# Patient Record
Sex: Male | Born: 1935
Health system: Southern US, Community
[De-identification: ages and names within clinical notes are randomized; demographics above are authoritative.]

## PROBLEM LIST (undated history)

## (undated) DIAGNOSIS — I2510 Atherosclerotic heart disease of native coronary artery without angina pectoris: Secondary | ICD-10-CM

## (undated) DIAGNOSIS — K635 Polyp of colon: Secondary | ICD-10-CM

## (undated) DIAGNOSIS — C61 Malignant neoplasm of prostate: Secondary | ICD-10-CM

## (undated) DIAGNOSIS — E1140 Type 2 diabetes mellitus with diabetic neuropathy, unspecified: Secondary | ICD-10-CM

## (undated) DIAGNOSIS — I10 Essential (primary) hypertension: Secondary | ICD-10-CM

## (undated) DIAGNOSIS — E119 Type 2 diabetes mellitus without complications: Secondary | ICD-10-CM

## (undated) DIAGNOSIS — T7840XA Allergy, unspecified, initial encounter: Secondary | ICD-10-CM

## (undated) DIAGNOSIS — N189 Chronic kidney disease, unspecified: Secondary | ICD-10-CM

## (undated) DIAGNOSIS — R011 Cardiac murmur, unspecified: Secondary | ICD-10-CM

## (undated) DIAGNOSIS — I071 Rheumatic tricuspid insufficiency: Secondary | ICD-10-CM

## (undated) DIAGNOSIS — I517 Cardiomegaly: Secondary | ICD-10-CM

## (undated) DIAGNOSIS — M129 Arthropathy, unspecified: Secondary | ICD-10-CM

## (undated) DIAGNOSIS — C8590 Non-Hodgkin lymphoma, unspecified, unspecified site: Secondary | ICD-10-CM

## (undated) DIAGNOSIS — J449 Chronic obstructive pulmonary disease, unspecified: Secondary | ICD-10-CM

## (undated) DIAGNOSIS — I340 Nonrheumatic mitral (valve) insufficiency: Secondary | ICD-10-CM

## (undated) DIAGNOSIS — I272 Pulmonary hypertension, unspecified: Secondary | ICD-10-CM

## (undated) DIAGNOSIS — I700 Atherosclerosis of aorta: Secondary | ICD-10-CM

## (undated) DIAGNOSIS — E7800 Pure hypercholesterolemia, unspecified: Secondary | ICD-10-CM

## (undated) DIAGNOSIS — I429 Cardiomyopathy, unspecified: Secondary | ICD-10-CM

## (undated) HISTORY — DX: Rheumatic tricuspid insufficiency: I07.1

## (undated) HISTORY — DX: Type 2 diabetes mellitus without complications: E11.9

## (undated) HISTORY — DX: Cardiomyopathy, unspecified: I42.9

## (undated) HISTORY — DX: Chronic obstructive pulmonary disease, unspecified: J44.9

## (undated) HISTORY — DX: Non-Hodgkin lymphoma, unspecified, unspecified site: C85.90

## (undated) HISTORY — DX: Type 2 diabetes mellitus with diabetic neuropathy, unspecified: E11.40

## (undated) HISTORY — DX: Allergy, unspecified, initial encounter: T78.40XA

## (undated) HISTORY — DX: Arthropathy, unspecified: M12.9

## (undated) HISTORY — DX: Cardiac murmur, unspecified: R01.1

## (undated) HISTORY — PX: HEMORROIDECTOMY: SUR656

## (undated) HISTORY — DX: Chronic kidney disease, unspecified: N18.9

## (undated) HISTORY — DX: Essential (primary) hypertension: I10

## (undated) HISTORY — DX: Pulmonary hypertension, unspecified: I27.20

## (undated) HISTORY — DX: Pure hypercholesterolemia, unspecified: E78.00

## (undated) HISTORY — DX: Polyp of colon: K63.5

## (undated) HISTORY — DX: Atherosclerotic heart disease of native coronary artery without angina pectoris: I25.10

## (undated) HISTORY — DX: Atherosclerosis of aorta: I70.0

## (undated) HISTORY — DX: Cardiomegaly: I51.7

## (undated) HISTORY — DX: Malignant neoplasm of prostate: C61

## (undated) HISTORY — DX: Nonrheumatic mitral (valve) insufficiency: I34.0

---

## 2012-07-29 ENCOUNTER — Encounter: Payer: Self-pay | Admitting: Pulmonary Disease

## 2012-07-30 ENCOUNTER — Ambulatory Visit (INDEPENDENT_AMBULATORY_CARE_PROVIDER_SITE_OTHER): Payer: Medicare Other | Admitting: Pulmonary Disease

## 2012-07-30 ENCOUNTER — Encounter: Payer: Self-pay | Admitting: Pulmonary Disease

## 2012-07-30 VITALS — BP 120/84 | HR 92 | Temp 97.9°F | Ht 72.0 in | Wt 180.0 lb

## 2012-07-30 DIAGNOSIS — J479 Bronchiectasis, uncomplicated: Secondary | ICD-10-CM | POA: Insufficient documentation

## 2012-07-30 DIAGNOSIS — J449 Chronic obstructive pulmonary disease, unspecified: Secondary | ICD-10-CM

## 2012-07-30 NOTE — Patient Instructions (Addendum)
You have severe COPD Trial of symbicort 2 puffs twice daily - RINSE mouth after use - call us for Rx if sample works Albuterol MDI 2 puffs every 6h as needed only  CXR today

## 2012-07-30 NOTE — Assessment & Plan Note (Signed)
You have severe COPD Trial of symbicort 2 puffs twice daily - RINSE mouth after use - call us for Rx if sample works Albuterol MDI 2 puffs every 6h as needed only  CXR today pulm rehab referral

## 2012-07-30 NOTE — Assessment & Plan Note (Signed)
CXr today Recent pneumonia- has resolved Doubt he needs continuous abx - sputum production does not seem to be an issue

## 2012-07-30 NOTE — Progress Notes (Addendum)
Subjective:    Patient ID: Eric Herring, male    DOB: 10/15/1934, 78 y.o.   MRN: [id number]  HPI  PCP- Lawson Radar, [facility]  78 y.o ex smoker with COPd referred  To establish pulmonary care. He was seeing dr Jarome Lamas prior. He has developed increased DOE which he attributes to the cold weather. He was treated for a LLL pneumonia & FU CT chest in 2/14 has shown resolution of the LLL consolidation. Extensive changes of emphysema with bullae & blebs were noted, a 6 mm non calcified nodule was stable in the RML when compared to CT from 6/12. Bronchiectatic changes were also noted in the LLL with scarring. He takes spiriva every am, does not exercise much. 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 He has CKD (cr 1.26), PSa is low 0.4 Echo in 6/12 showed EF 60%, mild LVH, mild -moderate PHT He quit smoking in 2009, about 30 Pyrs, was also exposed to sand dust in his occupation. He desatn to 92% on walking 3 laps. Spirometry showed FEV1 of 34% (1.02) with FVC of 58% 2.32) s/o severe airway obstruction  Past Medical History  Diagnosis Date  . CAD (coronary artery disease)   . Cardiomyopathy   . Hypercholesterolemia   . Prostate cancer   . Pulmonary hypertension   . LVH (left ventricular hypertrophy)   . Diabetes   . Mitral regurgitation   . Diabetic neuropathy   . Tricuspid regurgitation   . Lymphoma   . Colon polyp   . DOE (dyspnea on exertion)   . Arthropathy   . Heart murmur   . Chronic kidney disease     No past surgical history on file. Allergies  Allergen Reactions  . Lisinopril     History   Social History  . Marital Status: Married    Spouse Name: N/A    Number of Children: N/A  . Years of Education: N/A   Occupational History  . Not on file.   Social History Main Topics  . Smoking status: Former Smoker -- 1.00 packs/day for 25 years    Types: Cigarettes    Quit date: 11/30/2007  . Smokeless  tobacco: Not on file  . Alcohol Use: No  . Drug Use: No  . Sexually Active: Not on file   Other Topics Concern  . Not on file   Social History Narrative  . No narrative on file   No family history on file.    Review of Systems  Constitutional: Negative for fever and unexpected weight change.  HENT: Negative for ear pain, nosebleeds, congestion, sore throat, rhinorrhea, sneezing, trouble swallowing, dental problem, postnasal drip and sinus pressure.   Eyes: Negative for redness and itching.  Respiratory: Positive for cough and shortness of breath. Negative for chest tightness and wheezing.   Cardiovascular: Negative for palpitations and leg swelling.  Gastrointestinal: Negative for nausea and vomiting.  Genitourinary: Negative for dysuria.  Musculoskeletal: Negative for joint swelling.  Skin: Negative for rash.  Neurological: Negative for headaches.  Hematological: Does not bruise/bleed easily.  Psychiatric/Behavioral: Negative for dysphoric mood. The patient is not nervous/anxious.        Objective:   Physical Exam  Gen. Pleasant, well-nourished, in no distress, normal affect ENT - no lesions, no post nasal drip Neck: No JVD, no thyromegaly, no carotid bruits Lungs: no use of accessory muscles, no dullness to percussion, decreased without rales or rhonchi  Cardiovascular: Rhythm regular,  heart sounds  normal, no murmurs or gallops, no peripheral edema Abdomen: soft and non-tender, no hepatosplenomegaly, BS normal. Musculoskeletal: No deformities, no cyanosis or clubbing Neuro:  alert, non focal       Assessment & Plan:

## 2012-08-05 ENCOUNTER — Telehealth: Payer: Self-pay | Admitting: Pulmonary Disease

## 2012-08-05 DIAGNOSIS — J479 Bronchiectasis, uncomplicated: Secondary | ICD-10-CM

## 2012-08-05 NOTE — Telephone Encounter (Signed)
lmomtcb x1 for jasmine

## 2012-08-05 NOTE — Telephone Encounter (Signed)
Spoke with Eric Herring I advised that the signature on the referral was from Dr Elsworth Soho, not his stamp  She states either way needs a new referral b/c COPD is not covered with him having pre and post spiro The pt has dx of bronchiectasis and per [facility], this will work New referral was sent to [facility]

## 2012-08-14 ENCOUNTER — Telehealth: Payer: Self-pay | Admitting: Pulmonary Disease

## 2012-08-14 NOTE — Telephone Encounter (Signed)
[email or web address].Jennifer Castillo, CMA  

## 2012-08-14 NOTE — Telephone Encounter (Signed)
I spoke with spouse. She stated pt wakes up in the middle of night and will be SOB and will take a puff of the symbicort. She states she can't tell the symbicort has helped with his breathing. She wants to know if there is an alternative. She is fine with a call back tomorrow when RA is in the office. Please advise thanks  Allergies  Allergen Reactions  . Lisinopril

## 2012-08-14 NOTE — Telephone Encounter (Signed)
He can take albuterol for rescue  Ok to refill symbicort - this is maintenance medication

## 2012-08-15 NOTE — Telephone Encounter (Signed)
lmomtcb x1 

## 2012-08-15 NOTE — Telephone Encounter (Signed)
Spoke with patient wife---aware that Symbicort called in to pharmacy.   Patient wife is concerned that the patient is in need of O2 and would like to discuss possibly having him put on some O2--she states that he went to PCP the other day and they had to put him on O2 bc he was SOB and sats were low.   Dr Elsworth Soho please advise.

## 2012-08-15 NOTE — Telephone Encounter (Signed)
He may need O2 - pl get him in to see TP next Tuesday so we can evaluate His satn seemed good on his last visit with me.

## 2012-08-16 NOTE — Telephone Encounter (Signed)
appt set for 08-20-12 in [place] with TP. [place] Bing, CMA

## 2012-08-20 ENCOUNTER — Ambulatory Visit: Payer: Medicare Other | Admitting: Adult Health

## 2012-08-27 ENCOUNTER — Ambulatory Visit (INDEPENDENT_AMBULATORY_CARE_PROVIDER_SITE_OTHER): Payer: Medicare Other | Admitting: Adult Health

## 2012-08-27 ENCOUNTER — Encounter: Payer: Self-pay | Admitting: Adult Health

## 2012-08-27 VITALS — BP 118/90 | HR 107 | Temp 98.4°F | Ht 70.0 in | Wt 194.0 lb

## 2012-08-27 DIAGNOSIS — J449 Chronic obstructive pulmonary disease, unspecified: Secondary | ICD-10-CM

## 2012-08-27 MED ORDER — BUDESONIDE-FORMOTEROL FUMARATE 160-4.5 MCG/ACT IN AERO: INHALATION_SPRAY | RESPIRATORY_TRACT | Status: DC

## 2012-08-27 NOTE — Assessment & Plan Note (Signed)
Recent flare of COPD now resolving .  ? Leg swelling -chronic vs new - pt declined labs or tx as he says he feels fine and wants to address with his PCP in am .   Plan  Restart Symbicort 2 puffs twice daily - RINSE mouth after use Continue on Spiriva 1 puff daily  Use Oxygen 2 l/m with activity and At bedtime   Follow up as planned with your family doctor tomorrow regarding you ankle swelling.  follow up Dr. Elsworth Soho  In 3 months and As needed

## 2012-08-27 NOTE — Patient Instructions (Addendum)
Restart Symbicort 2 puffs twice daily - RINSE mouth after use Continue on Spiriva 1 puff daily  Use Oxygen 2 l/m with activity and At bedtime   Follow up as planned with your family doctor tomorrow regarding you ankle swelling.  follow up Dr. Elsworth Soho  In 3 months and As needed

## 2012-08-27 NOTE — Progress Notes (Signed)
Subjective:    Patient ID: Eric Herring, male    DOB: 11/27/1934, 78 y.o.   MRN: [id number]  HPI PCP- Lawson Radar, [facility]  78 y.o ex smoker with COPd referred  To establish pulmonary care 07/30/12. He was seeing dr Jarome Lamas prior. He has developed increased DOE which he attributes to the cold weather. He was treated for a LLL pneumonia & FU CT chest in 2/14 has shown resolution of the LLL consolidation. Extensive changes of emphysema with bullae & blebs were noted, a 6 mm non calcified nodule was stable in the RML when compared to CT from 6/12. Bronchiectatic changes were also noted in the LLL with scarring. He takes spiriva every am, does not exercise much. 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 He has CKD (cr 1.26), PSa is low 0.4 Echo in 6/12 showed EF 60%, mild LVH, mild -moderate PHT He quit smoking in 2009, about 30 Pyrs, was also exposed to sand dust in his occupation. He desatn to 92% on walking 3 laps. Spirometry showed FEV1 of 34% (1.02) with FVC of 58% 2.32) s/o severe airway obstruction >>started on Symbicort -sample given   08/27/2012 Follow up  Pt returns for a  1 month follow up . Seen last ov for initial pulmonary consult found to have severe COPD w/ FEV1 at 34%. He was started on Symbicort. He says he felt this did help but finished sample/ran out . Needs refill sent to pharmacy.  Did have flare 2 weeks ago, seen by PCP started on O2 at 2l/m with activity and At bedtime  . Says his oxygen level was low in office. No notes available.  He says he is doing well without flare in cough or wheezing.  No chest pain, syncope, orthopnea, fever, rash. Has DOE at baseline.  +leg swelling . Says his ankle have been swelling lately, has ov with PCP in am to look at. I suggested we look at today - he declined -"want PCP to look at " . No calf pain , orthopnea or increased dyspnea.    Review of Systems Constitutional:    No  weight loss, night sweats,  Fevers, chills, fatigue, or  lassitude.  HEENT:   No headaches,  Difficulty swallowing,  Tooth/dental problems, or  Sore throat,                No sneezing, itching, ear ache, nasal congestion, post nasal drip,   CV:  No chest pain,  Orthopnea, PND,   anasarca, dizziness, palpitations, syncope.   GI  No heartburn, indigestion, abdominal pain, nausea, vomiting, diarrhea, change in bowel habits, loss of appetite, bloody stools.   Resp:   No excess mucus, no productive cough,  No non-productive cough,  No coughing up of blood.  No change in color of mucus.  No wheezing.  No chest wall deformity  Skin: no rash or lesions.  GU: no dysuria, change in color of urine, no urgency or frequency.  No flank pain, no hematuria   MS:  No joint pain or swelling.  No decreased range of motion.  No back pain.  Psych:  No change in mood or affect. No depression or anxiety.  No memory loss.         Objective:   Physical Exam GEN: A/Ox3; pleasant , NAD    HEENT:  [place]/AT,  EACs-clear, TMs-wnl, NOSE-clear, THROAT-clear, no lesions, no postnasal drip or exudate noted.  NECK:  Supple w/ fair ROM; no JVD; normal carotid impulses w/o bruits; no thyromegaly or nodules palpated; no lymphadenopathy.  RESP  Diminished BS in bases, no  wheezes/ rales/ or rhonchi.no accessory muscle use, no dullness to percussion  CARD:  RRR, no m/r/g  , 1+ peripheral edema, pulses intact, no cyanosis or clubbing.  GI:   Soft & nt; nml bowel sounds; no organomegaly or masses detected.  Musco: Warm bil, no deformities or joint swelling noted.   Neuro: alert, no focal deficits noted.    Skin: Warm, no lesions or rashes          Assessment & Plan:

## 2012-11-12 ENCOUNTER — Ambulatory Visit: Payer: Medicare Other | Admitting: Pulmonary Disease

## 2012-11-19 ENCOUNTER — Encounter: Payer: Self-pay | Admitting: Pulmonary Disease

## 2012-11-19 ENCOUNTER — Ambulatory Visit (INDEPENDENT_AMBULATORY_CARE_PROVIDER_SITE_OTHER): Payer: Medicare Other | Admitting: Pulmonary Disease

## 2012-11-19 ENCOUNTER — Ambulatory Visit (HOSPITAL_BASED_OUTPATIENT_CLINIC_OR_DEPARTMENT_OTHER)
Admission: RE | Admit: 2012-11-19 | Discharge: 2012-11-19 | Disposition: A | Discharge status: Home / Self Care | Payer: Medicare Other | Source: Ambulatory Visit | Attending: Pulmonary Disease | Admitting: Pulmonary Disease

## 2012-11-19 VITALS — BP 132/68 | HR 55 | Ht 70.0 in | Wt 181.0 lb

## 2012-11-19 DIAGNOSIS — C8589 Other specified types of non-Hodgkin lymphoma, extranodal and solid organ sites: Secondary | ICD-10-CM | POA: Insufficient documentation

## 2012-11-19 DIAGNOSIS — Z8546 Personal history of malignant neoplasm of prostate: Secondary | ICD-10-CM | POA: Insufficient documentation

## 2012-11-19 DIAGNOSIS — J479 Bronchiectasis, uncomplicated: Secondary | ICD-10-CM | POA: Insufficient documentation

## 2012-11-19 DIAGNOSIS — Z87891 Personal history of nicotine dependence: Secondary | ICD-10-CM | POA: Insufficient documentation

## 2012-11-19 DIAGNOSIS — I428 Other cardiomyopathies: Secondary | ICD-10-CM | POA: Insufficient documentation

## 2012-11-19 DIAGNOSIS — J449 Chronic obstructive pulmonary disease, unspecified: Secondary | ICD-10-CM

## 2012-11-19 DIAGNOSIS — E119 Type 2 diabetes mellitus without complications: Secondary | ICD-10-CM | POA: Insufficient documentation

## 2012-11-19 DIAGNOSIS — I2789 Other specified pulmonary heart diseases: Secondary | ICD-10-CM | POA: Insufficient documentation

## 2012-11-19 NOTE — Progress Notes (Signed)
  Subjective:    Patient ID: Eric Herring, male    DOB: 06-Aug-1934, 78 y.o.   MRN: [id number]  HPI PCP- Lawson Radar, [facility]    78 y.o ex smoker with COPd referred To establish pulmonary care 07/30/12.  He was seeing dr Jarome Lamas prior. He has developed increased DOE which he attributes to the cold weather. He was treated for a LLL pneumonia & FU CT chest in 2/14 has shown resolution of the LLL consolidation. Extensive changes of emphysema with bullae & blebs were noted, a 6 mm non calcified nodule was stable in the RML when compared to CT from 6/12. Bronchiectatic changes were also noted in the LLL with scarring.  He takes spiriva every am, does not exercise much.  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  He has CKD (cr 1.26), PSa is low 0.4  Echo in 6/12 showed EF 60%, mild LVH, mild -moderate PHT  He quit smoking in 2009, about 30 Pyrs, was also exposed to sand dust in his occupation.  He desatn to 92% on walking 3 laps.  Spirometry showed FEV1 of 34% (1.02) with FVC of 58% 2.32) s/o severe airway obstruction  >>started on Symbicort -sample given    11/19/2012 63m FU  Pt reports breathing has been doing fine. Pt c/o cough w/ white phlem. No wheezing, no chest tx. Compliant with symbico  spiriva  In pulm rehab - does not desatn - questions need for oxygen, not using it, was started on it by PCP in 08/2012 -Says his oxygen level was low in office. No notes available.   Desatn to 88% on 3 laps CXR shows hyperinflation, nipple shadows- markers not needed in light of recent CT   Review of Systems neg for any significant sore throat, dysphagia, itching, sneezing, nasal congestion or excess/ purulent secretions, fever, chills, sweats, unintended wt loss, pleuritic or exertional cp, hempoptysis, orthopnea pnd or change in chronic leg swelling. Also denies presyncope, palpitations, heartburn, abdominal pain, nausea, vomiting,  diarrhea or change in bowel or urinary habits, dysuria,hematuria, rash, arthralgias, visual complaints, headache, numbness weakness or ataxia.     Objective:   Physical Exam  Gen. Pleasant, well-nourished, in no distress, normal affect ENT - no lesions, no post nasal drip Neck: No JVD, no thyromegaly, no carotid bruits Lungs: no use of accessory muscles, no dullness to percussion, Lt infrascapular rales, no rhonchi  Cardiovascular: Rhythm regular, heart sounds  normal, no murmurs or gallops, no peripheral edema Abdomen: soft and non-tender, no hepatosplenomegaly, BS normal. Musculoskeletal: No deformities, no cyanosis or clubbing Neuro:  alert, non focal       Assessment & Plan:

## 2012-11-19 NOTE — Assessment & Plan Note (Signed)
CXR today to establish baseline appearance

## 2012-11-19 NOTE — Patient Instructions (Addendum)
We will re-assess need for oxygen CXR today Stay on spiriva & symbicort OK to decrease albuterol nebs to once daily x 2 weeks , then as needed only Complete pulm rehab

## 2012-11-19 NOTE — Assessment & Plan Note (Signed)
We will re-assess need for oxygen Stay on spiriva & symbicort OK to decrease albuterol nebs to once daily x 2 weeks , then as needed only Complete pulm rehab Answered questions about disability

## 2012-12-02 ENCOUNTER — Telehealth: Payer: Self-pay | Admitting: Pulmonary Disease

## 2012-12-02 DIAGNOSIS — J449 Chronic obstructive pulmonary disease, unspecified: Secondary | ICD-10-CM

## 2012-12-02 NOTE — Telephone Encounter (Signed)
ONO - did not qualify for O2

## 2012-12-03 NOTE — Telephone Encounter (Signed)
I spoke with spouse and aware of recs. Spouse stated he does not use the O2 at all. He has it for PRN during the day but his O2 stays fine per spouse. She stated they are paying for something he does not use it. Can this be d/c? Please advise thanks

## 2012-12-03 NOTE — Telephone Encounter (Signed)
OK to dc O2 for now He may need it again if he gets sick with bronchitis

## 2012-12-03 NOTE — Telephone Encounter (Signed)
I spoke with spouse and is aware. She stated she wants to D/C this and sent to [facility]. Nothing further needed. order sent and staff message to [facility]

## 2013-03-18 ENCOUNTER — Encounter: Payer: Self-pay | Admitting: Pulmonary Disease

## 2013-03-18 ENCOUNTER — Ambulatory Visit (INDEPENDENT_AMBULATORY_CARE_PROVIDER_SITE_OTHER): Payer: Medicare Other | Admitting: Pulmonary Disease

## 2013-03-18 VITALS — BP 152/90 | HR 75 | Temp 98.0°F | Ht 70.0 in | Wt 181.0 lb

## 2013-03-18 DIAGNOSIS — J479 Bronchiectasis, uncomplicated: Secondary | ICD-10-CM

## 2013-03-18 DIAGNOSIS — J449 Chronic obstructive pulmonary disease, unspecified: Secondary | ICD-10-CM

## 2013-03-18 NOTE — Patient Instructions (Signed)
Stay on symbicort & spiriva OK to stay off oxygen

## 2013-03-18 NOTE — Assessment & Plan Note (Signed)
stable °

## 2013-03-18 NOTE — Progress Notes (Signed)
  Subjective:    Patient ID: Eric Herring, male    DOB: 09/01/1934, 78 y.o.   MRN: [id number]  HPI PCP- Lawson Radar, [facility]   78 y.o ex smoker for FU of  COPd & bronchiectasis Initial OV 07/30/12.  He was seeing dr Jarome Lamas prior. He was treated for a LLL pneumonia & FU CT chest in 2/14 has shown resolution of the LLL consolidation. Extensive changes of emphysema with bullae & blebs were noted, a 6 mm non calcified nodule was stable in the RML when compared to CT from 6/12. Bronchiectatic changes were also noted in the LLL with scarring.  He takes spiriva every am, does not exercise much.  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  He has CKD (cr 1.26), PSa is low 0.4  Echo in 6/12 showed EF 60%, mild LVH, mild -moderate PHT  He quit smoking in 2009, about 30 Pyrs, was also exposed to sand dust in his occupation.  He desatn to 92% on walking 3 laps.  Spirometry showed FEV1 of 34% (1.02) with FVC of 58% 2.32) s/o severe airway obstruction     03/18/2013 78m FU  Pt reports breathing has been doing fine. Pt c/o cough w/ white phlem. No wheezing, no chest tx.  Compliant with symbicort/ spiriva  completed pulm rehab  ONO - did not qualify for O2 satn 99% today   Review of Systems neg for any significant sore throat, dysphagia, itching, sneezing, nasal congestion or excess/ purulent secretions, fever, chills, sweats, unintended wt loss, pleuritic or exertional cp, hempoptysis, orthopnea pnd or change in chronic leg swelling. Also denies presyncope, palpitations, heartburn, abdominal pain, nausea, vomiting, diarrhea or change in bowel or urinary habits, dysuria,hematuria, rash, arthralgias, visual complaints, headache, numbness weakness or ataxia.     Objective:   Physical Exam  Gen. Pleasant, well-nourished, in no distress ENT - no lesions, no post nasal drip Neck: No JVD, no thyromegaly, no carotid bruits Lungs: no use  of accessory muscles, no dullness to percussion, left fine basal rales, no rhonchi  Cardiovascular: Rhythm regular, heart sounds  normal, no murmurs or gallops, no peripheral edema Musculoskeletal: No deformities, no cyanosis or clubbing        Assessment & Plan:

## 2013-03-18 NOTE — Assessment & Plan Note (Signed)
Stable, ct symbicort & spiriva

## 2013-10-06 ENCOUNTER — Ambulatory Visit: Payer: Medicare Other | Admitting: Pulmonary Disease

## 2013-10-30 ENCOUNTER — Ambulatory Visit (INDEPENDENT_AMBULATORY_CARE_PROVIDER_SITE_OTHER): Payer: Medicare HMO | Admitting: Pulmonary Disease

## 2013-10-30 ENCOUNTER — Encounter: Payer: Self-pay | Admitting: Pulmonary Disease

## 2013-10-30 VITALS — BP 120/68 | HR 72 | Ht 70.0 in | Wt 182.0 lb

## 2013-10-30 DIAGNOSIS — J449 Chronic obstructive pulmonary disease, unspecified: Secondary | ICD-10-CM

## 2013-10-30 DIAGNOSIS — J479 Bronchiectasis, uncomplicated: Secondary | ICD-10-CM

## 2013-10-30 NOTE — Assessment & Plan Note (Signed)
We discussed early signs of a chest cold Refills on symbicort & spiriva

## 2013-10-30 NOTE — Assessment & Plan Note (Signed)
stable °

## 2013-10-30 NOTE — Patient Instructions (Signed)
We discussed early signs of a chest cold Refills on symbicort & spiriva

## 2013-10-30 NOTE — Progress Notes (Signed)
   Subjective:    Patient ID: Eric Herring, male    DOB: 06/13/1934, 79 y.o.   MRN: [id number]  HPI  PCP- Lawson Radar, [facility]   79 y.o ex smoker for FU of COPd & bronchiectasis  Initial OV 07/30/12.  He was seeing dr Jarome Lamas prior. He was treated for a LLL pneumonia & FU CT chest in 2/14 has shown resolution of the LLL consolidation. Extensive changes of emphysema with bullae & blebs were noted, a 6 mm non calcified nodule was stable in the RML when compared to CT from 6/12. Bronchiectatic changes were also noted in the LLL with scarring.   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  He has CKD (cr 1.26), PSa is low 0.4  Echo 6/12 showed EF 60%, mild LVH, mild -moderate PHT  He quit smoking in 2009, about 30 Pyrs, was also exposed to sand dust in his occupation.  He desatn to 92% on walking 3 laps.  Spirometry showed FEV1 of 34% (1.02) with FVC of 58% 2.32) s/o severe airway obstruction  -completed pulm rehab  ONO - did not qualify for O2  10/30/2013  Chief Complaint  Patient presents with  . Follow-up    Pt has no breathing complaints at this time. CAT score 6   7 mnth FU Pt reports breathing has been doing fine.No wheezing, no chest tx.  Compliant with symbicort/ spiriva   He takes spiriva every am, does not exercise much.      Review of Systems neg for any significant sore throat, dysphagia, itching, sneezing, nasal congestion or excess/ purulent secretions, fever, chills, sweats, unintended wt loss, pleuritic or exertional cp, hempoptysis, orthopnea pnd or change in chronic leg swelling. Also denies presyncope, palpitations, heartburn, abdominal pain, nausea, vomiting, diarrhea or change in bowel or urinary habits, dysuria,hematuria, rash, arthralgias, visual complaints, headache, numbness weakness or ataxia.     Objective:   Physical Exam Gen. Pleasant, well-nourished, in no distress ENT - no lesions, no  post nasal drip Neck: No JVD, no thyromegaly, no carotid bruits Lungs: no use of accessory muscles, no dullness to percussion, decresaed without rales or rhonchi  Cardiovascular: Rhythm regular, heart sounds  normal, no murmurs or gallops, no peripheral edema Musculoskeletal: No deformities, no cyanosis or clubbing         Assessment & Plan:

## 2014-05-21 ENCOUNTER — Encounter: Payer: Self-pay | Admitting: Pulmonary Disease

## 2014-05-21 ENCOUNTER — Ambulatory Visit (INDEPENDENT_AMBULATORY_CARE_PROVIDER_SITE_OTHER): Payer: Medicare HMO | Admitting: Pulmonary Disease

## 2014-05-21 VITALS — BP 154/92 | HR 72 | Temp 98.6°F | Ht 66.0 in | Wt 185.0 lb

## 2014-05-21 DIAGNOSIS — J449 Chronic obstructive pulmonary disease, unspecified: Secondary | ICD-10-CM

## 2014-05-21 DIAGNOSIS — J479 Bronchiectasis, uncomplicated: Secondary | ICD-10-CM

## 2014-05-21 NOTE — Patient Instructions (Signed)
Oxygen level appears ok Stay on symbicort & spiriva

## 2014-05-21 NOTE — Assessment & Plan Note (Signed)
Complete 2 more days of levaquin

## 2014-05-21 NOTE — Assessment & Plan Note (Signed)
Oxygen level appears ok Stay on symbicort & spiriva

## 2014-05-21 NOTE — Progress Notes (Signed)
   Subjective:    Patient ID: Eric Herring, male    DOB: [birth year], 80 y.o.   MRN: [id number]  HPI  PCP-  [facility]   80 y.o ex smoker for FU of COPd & bronchiectasis  Initial OV 07/30/12.  He was seeing dr Jarome Lamas prior. He was treated for a LLL pneumonia & FU CT chest in 06/2012 has shown resolution of the LLL consolidation. Extensive changes of emphysema with bullae & blebs were noted, a 6 mm non calcified nodule was stable in the RML when compared to CT from 10/2010. Bronchiectatic changes were also noted in the LLL with scarring.   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  He has CKD (cr 1.26), PSa is low 0.4   Significant tests/ events  Echo 10/2010 showed EF 60%, mild LVH, mild -moderate PHT  He quit smoking in 2009, about 30 Pyrs, was also exposed to sand dust in his occupation.   Spirometry 2014 - FEV1 of 34% (1.02) with FVC of 58% 2.32)  -completed pulm rehab  ONO - did not qualify for O2   05/21/2014  Chief Complaint  Patient presents with  . Follow-up    coughing up white mucus, chest congestion   Got sick a week ago with cough - went to [facility] - CXr ok, given shots & levaquin  - 2 more days to go, coughing much better, no wheezing Compliant with symbicort/ spiriva  He takes spiriva every am, does not exercise much.  Does not desatn with walking - HR went up to 132     Review of Systems neg for any significant sore throat, dysphagia, itching, sneezing, nasal congestion or excess/ purulent secretions, fever, chills, sweats, unintended wt loss, pleuritic or exertional cp, hempoptysis, orthopnea pnd or change in chronic leg swelling. Also denies presyncope, palpitations, heartburn, abdominal pain, nausea, vomiting, diarrhea or change in bowel or urinary habits, dysuria,hematuria, rash, arthralgias, visual complaints, headache, numbness weakness or ataxia.     Objective:   Physical  Exam  Gen. Pleasant, well-nourished, in no distress ENT - no lesions, no post nasal drip Neck: No JVD, no thyromegaly, no carotid bruits Lungs: no use of accessory muscles, no dullness to percussion, decreased BL without rales or rhonchi  Cardiovascular: Rhythm regular, heart sounds  normal, no murmurs or gallops, no peripheral edema Musculoskeletal: No deformities, no cyanosis or clubbing        Assessment & Plan:

## 2014-10-08 ENCOUNTER — Ambulatory Visit (HOSPITAL_BASED_OUTPATIENT_CLINIC_OR_DEPARTMENT_OTHER)
Admission: RE | Admit: 2014-10-08 | Discharge: 2014-10-08 | Disposition: A | Discharge status: Home / Self Care | Payer: Medicare HMO | Source: Ambulatory Visit | Attending: Adult Health | Admitting: Adult Health

## 2014-10-08 ENCOUNTER — Ambulatory Visit (INDEPENDENT_AMBULATORY_CARE_PROVIDER_SITE_OTHER): Payer: Medicare HMO | Admitting: Adult Health

## 2014-10-08 ENCOUNTER — Other Ambulatory Visit: Payer: Medicare HMO

## 2014-10-08 ENCOUNTER — Encounter: Payer: Self-pay | Admitting: Adult Health

## 2014-10-08 VITALS — BP 140/88 | HR 90 | Temp 97.6°F | Ht 72.0 in | Wt 191.0 lb

## 2014-10-08 DIAGNOSIS — J449 Chronic obstructive pulmonary disease, unspecified: Secondary | ICD-10-CM | POA: Diagnosis not present

## 2014-10-08 DIAGNOSIS — R609 Edema, unspecified: Secondary | ICD-10-CM

## 2014-10-08 DIAGNOSIS — J441 Chronic obstructive pulmonary disease with (acute) exacerbation: Secondary | ICD-10-CM

## 2014-10-08 DIAGNOSIS — R0602 Shortness of breath: Secondary | ICD-10-CM | POA: Diagnosis not present

## 2014-10-08 HISTORY — DX: Edema, unspecified: R60.9

## 2014-10-08 MED ORDER — FUROSEMIDE 20 MG PO TABS
20.0000 mg | ORAL_TABLET | Freq: Every day | ORAL | Status: DC | PRN
Start: 2014-10-08 — End: 2015-11-18

## 2014-10-08 NOTE — Patient Instructions (Signed)
Chest xray and labs today  Begin Lasix 20mg  2 tabs today then 1 daily for 3 days  Then use Lasix 20mg  daily As needed  Leg swelling .  Eat bananas daily  Follow up in 1 week and As needed   Please contact office for sooner follow up if symptoms do not improve or worsen or seek emergency care

## 2014-10-08 NOTE — Progress Notes (Signed)
Reviewed & agree with plan  

## 2014-10-08 NOTE — Progress Notes (Signed)
   Subjective:    Patient ID: Eric Herring, male    DOB: 15-Feb-1935, 80 y.o.   MRN: [id number]  HPI  PCP-  [facility]   80 y.o ex smoker for FU of COPd & bronchiectasis  Initial OV 07/30/12.  He was seeing dr Jarome Lamas prior. He was treated for a LLL pneumonia & FU CT chest in 06/2012 has shown resolution of the LLL consolidation. Extensive changes of emphysema with bullae & blebs were noted, a 6 mm non calcified nodule was stable in the RML when compared to CT from 10/2010. Bronchiectatic changes were also noted in the LLL with scarring.   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  He has CKD (cr 1.26), PSa is low 0.4   Significant tests/ events  Echo 10/2010 showed EF 60%, mild LVH, mild -moderate PHT  He quit smoking in 2009, about 30 Pyrs, was also exposed to sand dust in his occupation.   Spirometry 2014 - FEV1 of 34% (1.02) with FVC of 58% 2.32)  -completed pulm rehab  ONO - did not qualify for O2  05/21/14  Got sick a week ago with cough - went [facility] - CXr ok, given shots & levaquin  - 2 more days to go, coughing much better, no wheezing Compliant with symbicort/ spiriva  He takes spiriva every am, does not exercise much.  Does not desatn with walking - HR went up to 132  10/08/2014 Acute OV : COPD/former smoker and Bronchiectasis , Lymphoma survivor  Pt presents for an acute office visit.  Complains of increased dyspnea,  with increased albuterol use.  Working out side, breathing in a lot of pollen, causing breathing issues.   No chest tightness, just having hard time catching breath, using inhaler and nebulizer more than usual. Increased dyspnea at bedtime, cant lie back. Both legs swollen for last week.  +orthpnea/Pnd.  Wt up 6lbs Remains on symbicort and spiriva  Last cxr 2014 with copd changes .  No cough , fever, discolored mucus, wheezing, n/vd/, chest pain, syncope or palpitation. No  hemoptysis.  Good apptetite. Says he was seen by PCP yesterday- no records available. Says he was suppose to start on rx but was not at pharmacy .    Review of Systems neg for any significant sore throat, dysphagia, itching, sneezing, nasal congestion or excess/ purulent secretions, fever, chills, sweats, unintended wt loss, pleuritic or exertional cp, hempoptysis, + orthopnea pnd change in chronic leg swelling. Also denies presyncope, palpitations, heartburn, abdominal pain, nausea, vomiting, diarrhea or change in bowel or urinary habits, dysuria,hematuria, rash, arthralgias, visual complaints, headache, numbness weakness or ataxia.     Objective:   Physical Exam  Gen. Pleasant, elderly , in no distress ENT - no lesions, no post nasal drip Neck: No JVD, no thyromegaly, no carotid bruits Lungs: no use of accessory muscles, no dullness to percussion, decreased BS without rales or rhonchi  Cardiovascular: Rhythm regular, heart sounds  normal, no murmurs or gallops, 2+ peripheral edema-bilateral symmetrical , no calf tenderness, neg homans sign  Musculoskeletal: No deformities, no cyanosis or clubbing        Assessment & Plan:

## 2014-10-08 NOTE — Assessment & Plan Note (Signed)
Edema with suspected fluid overload -?diastolic CHF decompensation  Check cxr and bnp  No wheezing or cough on exam-hold on steroid for now  Begin gentle diuresis , check bnp if elevated consider echo Close follow up in 1 week  Will send notes and labs to PCP   Plan  Chest xray and labs today  Begin Lasix 20mg  2 tabs today then 1 daily for 3 days  Then use Lasix 20mg  daily As needed  Leg swelling .  Eat bananas daily  Follow up in 1 week and As needed   Please contact office for sooner follow up if symptoms do not improve or worsen or seek emergency care

## 2014-10-08 NOTE — Assessment & Plan Note (Signed)
Flare with suspected fluid overload -?diastolic CHF decompensation  Check cxr and bnp  No wheezing or cough on exam-hold on steroid for now  Begin gentle diuresis , check bnp if elevated consider echo Close follow up in 1 week  Will send notes and labs to PCP   Plan  Chest xray and labs today  Begin Lasix 20mg  2 tabs today then 1 daily for 3 days  Then use Lasix 20mg  daily As needed  Leg swelling .  Eat bananas daily  Follow up in 1 week and As needed   Please contact office for sooner follow up if symptoms do not improve or worsen or seek emergency care

## 2014-10-09 LAB — BASIC METABOLIC PANEL WITH GFR
BUN: 13 mg/dL (ref 6–23)
CHLORIDE: 106 meq/L (ref 96–112)
CO2: 19 meq/L (ref 19–32)
Calcium: 9.4 mg/dL (ref 8.4–10.5)
Creat: 1.4 mg/dL — ABNORMAL HIGH (ref 0.50–1.35)
GFR, EST AFRICAN AMERICAN: 55 mL/min — AB
GFR, EST NON AFRICAN AMERICAN: 48 mL/min — AB
GLUCOSE: 97 mg/dL (ref 70–99)
POTASSIUM: 4.2 meq/L (ref 3.5–5.3)
Sodium: 139 mEq/L (ref 135–145)

## 2014-10-13 ENCOUNTER — Telehealth: Payer: Self-pay | Admitting: Adult Health

## 2014-10-13 NOTE — Telephone Encounter (Signed)
Patient's wife notified of lab results. Nothing further needed.  

## 2014-10-15 ENCOUNTER — Encounter: Payer: Self-pay | Admitting: Adult Health

## 2014-10-15 ENCOUNTER — Ambulatory Visit (INDEPENDENT_AMBULATORY_CARE_PROVIDER_SITE_OTHER): Payer: Medicare HMO | Admitting: Adult Health

## 2014-10-15 VITALS — BP 147/95 | HR 80 | Ht 70.0 in | Wt 185.0 lb

## 2014-10-15 DIAGNOSIS — J449 Chronic obstructive pulmonary disease, unspecified: Secondary | ICD-10-CM

## 2014-10-15 DIAGNOSIS — R609 Edema, unspecified: Secondary | ICD-10-CM | POA: Diagnosis not present

## 2014-10-15 NOTE — Progress Notes (Signed)
Reviewed & agree with plan  

## 2014-10-15 NOTE — Progress Notes (Signed)
Subjective:    Patient ID: Eric Herring, male    DOB: [birth year], 80 y.o.   MRN: [id number]  HPI  [facility]   80 y.o ex smoker for FU of COPd & bronchiectasis  Initial OV 07/30/12.  He was seeing dr Jarome Lamas prior. He was treated for a LLL pneumonia & FU CT chest in 06/2012 has shown resolution of the LLL consolidation. Extensive changes of emphysema with bullae & blebs were noted, a 6 mm non calcified nodule was stable in the RML when compared to CT from 10/2010. Bronchiectatic changes were also noted in the LLL with scarring.   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  He has CKD (cr 1.26), PSa is low 0.4   Significant tests/ events  Echo 10/2010 showed EF 60%, mild LVH, mild -moderate PHT  He quit smoking in 2009, about 30 Pyrs, was also exposed to sand dust in his occupation.   Spirometry 2014 - FEV1 of 34% (1.02) with FVC of 58% 2.32)  -completed pulm rehab  ONO - did not qualify for O2  05/21/14  Got sick a week ago with cough - went [facility] - CXr ok, given shots & levaquin  - 2 more days to go, coughing much better, no wheezing Compliant with symbicort/ spiriva  He takes spiriva every am, does not exercise much.  Does not desatn with walking - HR went up to 132  10/08/14 Acute OV : COPD/former smoker and Bronchiectasis , Lymphoma survivor  Pt presents for an acute office visit.  Complains of increased dyspnea,  with increased albuterol use.  Working out side, breathing in a lot of pollen, causing breathing issues.   No chest tightness, just having hard time catching breath, using inhaler and nebulizer more than usual. Increased dyspnea at bedtime, cant lie back. Both legs swollen for last week.  +orthpnea/Pnd.  Wt up 6lbs Remains on symbicort and spiriva  Last cxr 2014 with copd changes .  No cough , fever, discolored mucus, wheezing, n/vd/, chest pain, syncope or palpitation. No  hemoptysis.  Good apptetite. Says he was seen by PCP yesterday- no records available. Says he was suppose to start on rx but was not at pharmacy .  >>cxr , lasix rx , labs   10/15/2014 Follow up : COPD flare /fluid overload  Pt returns for 1 week follow up .  Last ov with worsening dyspnea/DOE, +orthopnea and leg swelling.  CXR showed chronic changes , no acute process  Labs were ordered but BNP not done. bmet showed renal insufficiency with scr 1.4 .  He was given lasix 40mg  daily x 3 then 20mg  daily As needed   Says he is much better . Leg swelling is near resolved . Wt is down 6 lbs.  Dyspnea is much better.  Seen by PCP this week. (note and labs from office were sent) .  He denies cough, congestion , fever , chest pain, or orthopena.  Wants to return to his activity .     Review of Systems neg for any significant sore throat, dysphagia, itching, sneezing, nasal congestion or excess/ purulent secretions, fever, chills, sweats, unintended wt loss, pleuritic or exertional cp, hempoptysis, + orthopnea pnd change in chronic leg swelling. Also denies presyncope, palpitations, heartburn, abdominal pain, nausea, vomiting, diarrhea or change in bowel or urinary habits, dysuria,hematuria, rash, arthralgias, visual complaints, headache, numbness weakness or ataxia.  Objective:   Physical Exam  Gen. Pleasant, elderly , in no distress ENT - no lesions, no post nasal drip Neck: No JVD, no thyromegaly, no carotid bruits Lungs: no use of accessory muscles, no dullness to percussion, decreased BS without rales or rhonchi  Cardiovascular: Rhythm regular, heart sounds  normal, no murmurs or gallops, tr peripheral edema-bilateral  , no calf tenderness, neg homans sign  Musculoskeletal: No deformities, no cyanosis or clubbing        Assessment & Plan:

## 2014-10-15 NOTE — Assessment & Plan Note (Signed)
Suspect has component of Diastolic Dysfunction  No bnp done  Improved with lasix  Use As needed  As renal insufficiency -to follow with PCP  Low salt diet  Leg elevation.

## 2014-10-15 NOTE — Assessment & Plan Note (Signed)
Compensated on present regimen   Plan  Continue Symbicort and Spiriva .  Follow up Dr. Elsworth Soho  In 4 months and As needed

## 2014-10-15 NOTE — Patient Instructions (Signed)
Continue Symbicort and Spiriva .  Follow up Dr. Elsworth Soho  In 4 months and As needed

## 2014-11-04 DIAGNOSIS — R000 Tachycardia, unspecified: Secondary | ICD-10-CM | POA: Insufficient documentation

## 2015-05-13 ENCOUNTER — Encounter: Payer: Self-pay | Admitting: Pulmonary Disease

## 2015-05-13 ENCOUNTER — Ambulatory Visit (INDEPENDENT_AMBULATORY_CARE_PROVIDER_SITE_OTHER): Payer: Medicare HMO | Admitting: Pulmonary Disease

## 2015-05-13 VITALS — BP 151/91 | HR 80 | Ht 66.0 in | Wt 192.0 lb

## 2015-05-13 DIAGNOSIS — J479 Bronchiectasis, uncomplicated: Secondary | ICD-10-CM

## 2015-05-13 DIAGNOSIS — J449 Chronic obstructive pulmonary disease, unspecified: Secondary | ICD-10-CM | POA: Diagnosis not present

## 2015-05-13 MED ORDER — TIOTROPIUM BROMIDE MONOHYDRATE 18 MCG IN CAPS: 18.0000 ug | ORAL_CAPSULE | Freq: Every day | RESPIRATORY_TRACT | Status: DC

## 2015-05-13 MED ORDER — BUDESONIDE-FORMOTEROL FUMARATE 160-4.5 MCG/ACT IN AERO: 2.0000 | INHALATION_SPRAY | Freq: Two times a day (BID) | RESPIRATORY_TRACT | Status: DC

## 2015-05-13 MED ORDER — BUDESONIDE-FORMOTEROL FUMARATE 160-4.5 MCG/ACT IN AERO: INHALATION_SPRAY | RESPIRATORY_TRACT | Status: DC

## 2015-05-13 NOTE — Assessment & Plan Note (Signed)
Stable Discussed early symptoms of bronchitis

## 2015-05-13 NOTE — Assessment & Plan Note (Signed)
Refills on symbicort & spiriva You may have to see a neurologist for numbness in hands Lung function is preserved at 41%

## 2015-05-13 NOTE — Addendum Note (Signed)
Addended by: Osa Craver on: 05/13/2015 02:52 PM   Modules accepted: Orders

## 2015-05-13 NOTE — Patient Instructions (Addendum)
Refills on symbicort & spiriva You may have to see a neurologist for numbness in hands Lung function is preserved at 41%

## 2015-05-13 NOTE — Progress Notes (Signed)
   Subjective:    Patient ID: Eric Herring, male    DOB: Oct 09, 1934, 81 y.o.   MRN: [id number]  HPI  [facility]-  [facility]   81 y.o ex smoker for FU of COPd & bronchiectasis  Initial OV 07/30/12.  He was seeing dr Jarome Lamas prior. He was treated for a LLL pneumonia & FU CT chest in 06/2012 has shown resolution of the LLL consolidation. Extensive changes of emphysema with bullae & blebs were noted, a 6 mm non calcified nodule was stable in the RML when compared to CT from 10/2010. Bronchiectatic changes were also noted in the LLL with scarring.   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  He has CKD (cr 1.26), PSa is low 0.4    05/13/2015  Chief Complaint  Patient presents with  . Follow-up    Pt c/o occasional SOB and wheezing. Sinus congestion and PND starting on 05/06/15. Denies any chest congestion/tightness, cough, fever, nausea or vomiting.     Flare in 10/2014 - leg swelling - needed lasix briefly Breathing at baseline now No wheeze, clear sputum, no noct symptoms C/o numbness in both hands & arthritic changes  Significant tests/ events  Echo 10/2010 showed EF 60%, mild LVH, mild -moderate PHT  He quit smoking in 2009, about 30 Pyrs, was also exposed to sand dust in his occupation.   Spirometry 2014 - FEV1 of 34% (1.02) with FVC of 58% 2.32)  -completed pulm rehab  ONO - did not qualify for O2  Arlyce Harman 05/2015 FEV1 41%  He denies cough, congestion , fever , chest pain, or orthopena.     Review of Systems neg for any significant sore throat, dysphagia, itching, sneezing, nasal congestion or excess/ purulent secretions, fever, chills, sweats, unintended wt loss, pleuritic or exertional cp, hempoptysis, orthopnea pnd or change in chronic leg swelling. Also denies presyncope, palpitations, heartburn, abdominal pain, nausea, vomiting, diarrhea or change in bowel or urinary habits, dysuria,hematuria, rash, arthralgias,  visual complaints, headache, numbness weakness or ataxia.     Objective:   Physical Exam  Gen. Pleasant, well-nourished, in no distress ENT - no lesions, no post nasal drip Neck: No JVD, no thyromegaly, no carotid bruits Lungs: no use of accessory muscles, no dullness to percussion, clear without rales or rhonchi  Cardiovascular: Rhythm regular, heart sounds  normal, no murmurs or gallops, no peripheral edema Musculoskeletal: No deformities, no cyanosis or clubbing        Assessment & Plan:

## 2015-11-18 ENCOUNTER — Encounter: Payer: Self-pay | Admitting: Adult Health

## 2015-11-18 ENCOUNTER — Ambulatory Visit (INDEPENDENT_AMBULATORY_CARE_PROVIDER_SITE_OTHER): Payer: Medicare HMO | Admitting: Adult Health

## 2015-11-18 VITALS — BP 139/85 | HR 75 | Temp 98.1°F | Ht 66.0 in | Wt 189.0 lb

## 2015-11-18 DIAGNOSIS — J449 Chronic obstructive pulmonary disease, unspecified: Secondary | ICD-10-CM | POA: Diagnosis not present

## 2015-11-18 DIAGNOSIS — J479 Bronchiectasis, uncomplicated: Secondary | ICD-10-CM | POA: Diagnosis not present

## 2015-11-18 MED ORDER — UMECLIDINIUM BROMIDE 62.5 MCG/INH IN AEPB
1.0000 | INHALATION_SPRAY | Freq: Every day | RESPIRATORY_TRACT | Status: DC
Start: 2015-11-18 — End: 2016-12-21

## 2015-11-18 NOTE — Assessment & Plan Note (Signed)
Controlled without flare   Plan  Check with primary MD to make sure you have gotten Pneumovax and Prevnar 13 vaccines.  Continue on INCRUSE daily  follow up Dr. Elsworth Soho  In 6 months and As needed

## 2015-11-18 NOTE — Addendum Note (Signed)
Addended by: Mathis Dad on: 11/18/2015 09:52 AM   Modules accepted: Orders

## 2015-11-18 NOTE — Patient Instructions (Addendum)
Check with primary MD to make sure you have gotten Pneumovax and Prevnar 13 vaccines.  Continue on INCRUSE daily  follow up Dr. Elsworth Soho  In 6 months and As needed

## 2015-11-18 NOTE — Progress Notes (Signed)
Subjective:    Patient ID: Eric Herring, male    DOB: [birth year], 81 y.o.   MRN: [id number]  HPI  [facility]   81 y.o ex smoker for FU of COPd & bronchiectasis  Initial OV 07/30/12.  He was seeing dr Jarome Lamas prior. He was treated for a LLL pneumonia & FU CT chest in 06/2012 has shown resolution of the LLL consolidation. Extensive changes of emphysema with bullae & blebs were noted, a 6 mm non calcified nodule was stable in the RML when compared to CT from 10/2010. Bronchiectatic changes were also noted in the LLL with scarring.   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  He has CKD (cr 1.26), PSa is low 0.4   Significant tests/ events  Echo 10/2010 showed EF 60%, mild LVH, mild -moderate PHT  He quit smoking in 2009, about 30 Pyrs, was also exposed to sand dust in his occupation.   Spirometry 2014 - FEV1 of 34% (1.02) with FVC of 58% 2.32)  -completed pulm rehab  ON[facility] - did not qualify for O2    11/18/2015 Follow up : COPD /Bronchiectasis  Pt returns for 6 month follow up .  Says overall he is doing well.  Gets winded with walking long distance.  Does not like this humid and hot temps.  PVX and Prevnar -not sure about dates.  cxr 10/2014 with COPD changes  Remains on INCRUSE daily , started by PCP  Previously on symibcort and spiriva.  Uses Albuterol Neb daily  And As needed  .  Rarely uses it more than daily.  On O2 2l/m at At bedtime  -his PCP started this about 6 months.  Denies chest pain, orthopnea, edema or hemoptysis .      Past Medical History  Diagnosis Date  . CAD (coronary artery disease)   . Cardiomyopathy ([place])   . Hypercholesterolemia   . Prostate cancer ([place])   . Pulmonary hypertension ([place])   . LVH (left ventricular hypertrophy)   . Diabetes ([place])   . Mitral regurgitation   . Diabetic neuropathy ([place])   . Tricuspid regurgitation   . Lymphoma ([place])   . Colon polyp   . DOE  (dyspnea on exertion)   . Arthropathy   . Heart murmur   . Chronic kidney disease    Current Outpatient Prescriptions on File Prior to Visit  Medication Sig Dispense Refill  . albuterol (PROVENTIL HFA;VENTOLIN HFA) 108 (90 BASE) MCG/ACT inhaler Inhale 2 puffs into the lungs 2 (two) times daily.    Marland Kitchen albuterol (PROVENTIL) (2.5 MG/3ML) 0.083% nebulizer solution Take 2.5 mg by nebulization 2 (two) times daily.    Marland Kitchen allopurinol (ZYLOPRIM) 100 MG tablet Take 100 mg by mouth.    Marland Kitchen aspirin 81 MG tablet Take 81 mg by mouth daily.     No current facility-administered medications on file prior to visit.       Review of Systems neg for any significant sore throat, dysphagia, itching, sneezing, nasal congestion or excess/ purulent secretions, fever, chills, sweats, unintended wt loss, pleuritic or exertional cp, hempoptysis, + orthopnea pnd change in chronic leg swelling. Also denies presyncope, palpitations, heartburn, abdominal pain, nausea, vomiting, diarrhea or change in bowel or urinary habits, dysuria,hematuria, rash, arthralgias, visual complaints, headache, numbness weakness or ataxia.     Objective:   Physical Exam Filed Vitals:   11/18/15 0926  BP: 139/85  Pulse: 75  Temp:  98.1 F (36.7 C)  TempSrc: Oral  Height: 5\' 6"  (1.676 m)  Weight: 189 lb (85.73 kg)  SpO2: 94%   ;  Gen. Pleasant, elderly , in no distress ENT - no lesions, no post nasal drip Neck: No JVD, no thyromegaly, no carotid bruits Lungs: no use of accessory muscles, no dullness to percussion, decreased BS without rales or rhonchi  Cardiovascular: Rhythm regular, heart sounds  normal, no murmurs or gallops, tr peripheral edema-bilateral  , no calf tenderness, neg homans sign  Musculoskeletal: No deformities, no cyanosis or clubbing   Tammy Parrett NP-C  [facility]  11/18/2015

## 2015-11-18 NOTE — Assessment & Plan Note (Signed)
Doing well on current regimen   Plan  Check with primary MD to make sure you have gotten Pneumovax and Prevnar 13 vaccines.  Continue on INCRUSE daily  follow up Dr. Elsworth Soho  In 6 months and As needed

## 2016-06-20 ENCOUNTER — Ambulatory Visit: Payer: Medicare HMO | Admitting: Pulmonary Disease

## 2016-06-22 ENCOUNTER — Encounter: Payer: Self-pay | Admitting: Adult Health

## 2016-06-22 ENCOUNTER — Ambulatory Visit (INDEPENDENT_AMBULATORY_CARE_PROVIDER_SITE_OTHER): Payer: Medicare HMO | Admitting: Adult Health

## 2016-06-22 VITALS — BP 132/76 | HR 81 | Ht 66.0 in | Wt 203.0 lb

## 2016-06-22 DIAGNOSIS — J449 Chronic obstructive pulmonary disease, unspecified: Secondary | ICD-10-CM | POA: Diagnosis not present

## 2016-06-22 DIAGNOSIS — J9611 Chronic respiratory failure with hypoxia: Secondary | ICD-10-CM | POA: Diagnosis not present

## 2016-06-22 MED ORDER — FLUTICASONE FUROATE-VILANTEROL 200-25 MCG/INH IN AEPB
1.0000 | INHALATION_SPRAY | Freq: Every day | RESPIRATORY_TRACT | 1 refills | Status: DC
Start: 2016-06-22 — End: 2017-09-07

## 2016-06-22 MED ORDER — UMECLIDINIUM BROMIDE 62.5 MCG/INH IN AEPB: 1.0000 | INHALATION_SPRAY | Freq: Every day | RESPIRATORY_TRACT | 1 refills | Status: DC

## 2016-06-22 MED ORDER — UMECLIDINIUM BROMIDE 62.5 MCG/INH IN AEPB: 1.0000 | INHALATION_SPRAY | Freq: Every day | RESPIRATORY_TRACT | 0 refills | Status: DC

## 2016-06-22 MED ORDER — FLUTICASONE FUROATE-VILANTEROL 200-25 MCG/INH IN AEPB
1.0000 | INHALATION_SPRAY | Freq: Every day | RESPIRATORY_TRACT | 0 refills | Status: DC
Start: 2016-06-22 — End: 2016-12-21

## 2016-06-22 NOTE — Assessment & Plan Note (Signed)
More symptomatic off triple inhaler use.  Add BREO to his INCRUSE   Plan  Patient Instructions  Continue on INCRUSE 1 puff daily  Begin BREO 1 puff daily  Set up for ONO on room air to see if you still need oxygen .  follow up Dr. Elsworth Soho  In 4-6 months and As needed   Please contact office for sooner follow up if symptoms do not improve or worsen or seek emergency care

## 2016-06-22 NOTE — Assessment & Plan Note (Signed)
Check ONO to see if O2 is still needed.

## 2016-06-22 NOTE — Patient Instructions (Addendum)
Continue on INCRUSE 1 puff daily  Begin BREO 1 puff daily  Set up for ONO on room air to see if you still need oxygen .  follow up Dr. Elsworth Soho  In 4-6 months and As needed   Please contact office for sooner follow up if symptoms do not improve or worsen or seek emergency care

## 2016-06-22 NOTE — Progress Notes (Signed)
@Patient  ID: Eric Herring, male    DOB: [birth year], 82 y.o.   MRN: [id number]  Chief Complaint  Patient presents with  . Follow-up    Referring provider: Secundino Ginger, PA-C  HPI:  [facility]   82 y.o ex smoker for FU of COPd & bronchiectasis  Initial OV 07/30/12.  He was seeing dr Jarome Lamas prior. He was treated for a LLL pneumonia & FU CT chest in 06/2012 has shown resolution of the LLL consolidation. Extensive changes of emphysema with bullae & blebs were noted, a 6 mm non calcified nodule was stable in the RML when compared to CT from 10/2010. Bronchiectatic changes were also noted in the LLL with scarring.   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  He has CKD (cr 1.26), PSa is low 0.4   Significant tests/ events Echo 10/2010 showed EF 60%, mild LVH, mild -moderate PHT  He quit smoking in 2009, about 30 Pyrs, was also exposed to sand dust in his occupation.   Spirometry 2014 - FEV1 of 34% (1.02) with FVC of 58% 2.32)  -completed pulm rehab    06/22/2016 Follow up : COPD/Bronchiectasis /O2 RF  Pt returns for 6 month follow up . He has severe COPD on Incruse . Previously on Symbicort /Spiriva. Feels he did better on 2 inhalers. Would like to try BREO , family member is on this and was told it really works. He continues to try to be active but gets winded with long walking . He stops and rests and is able to get started again. No chest pain.   Declines chest xray today, says his PCP did one.  . Says PVX and Prevnar are utd from PCP.   Uses O2 At bedtime  But wants to stop does not feel he needs this. We discussed ONO to check .  Has daily ankles swelling that goes away if he elevates legs. No orthopnea.    Allergies  Allergen Reactions  . Lisinopril     Immunization History  Administered Date(s) Administered  . Influenza Split 02/15/2013  . Influenza Whole 03/01/2012  . Influenza, High Dose  Seasonal PF 02/20/2016  . Influenza,inj,Quad PF,36+ Mos 01/07/2015  . Influenza-Unspecified 02/05/2014    Past Medical History:  Diagnosis Date  . Arthropathy   . CAD (coronary artery disease)   . Cardiomyopathy ([place])   . Chronic kidney disease   . Colon polyp   . Diabetes ([place])   . Diabetic neuropathy ([place])   . DOE (dyspnea on exertion)   . Heart murmur   . Hypercholesterolemia   . LVH (left ventricular hypertrophy)   . Lymphoma ([place])   . Mitral regurgitation   . Prostate cancer ([place])   . Pulmonary hypertension   . Tricuspid regurgitation     Tobacco History: History  Smoking Status  . Former Smoker  . Packs/day: 1.00  . Years: 25.00  . Types: Cigarettes  . Quit date: 11/30/2007  Smokeless Tobacco  . Never Used   Counseling given: Not Answered   Outpatient Encounter Prescriptions as of 06/22/2016  Medication Sig  . albuterol (PROVENTIL HFA;VENTOLIN HFA) 108 (90 BASE) MCG/ACT inhaler Inhale 2 puffs into the lungs 2 (two) times daily.  Marland Kitchen albuterol (PROVENTIL) (2.5 MG/3ML) 0.083% nebulizer solution Take 2.5 mg by nebulization 2 (two) times daily.  Marland Kitchen allopurinol (ZYLOPRIM) 100 MG tablet Take 100 mg by mouth.  Marland Kitchen amLODipine (NORVASC) 5 MG  tablet Take 5 mg by mouth daily.  Marland Kitchen aspirin 81 MG tablet Take 81 mg by mouth daily.  . carvedilol (COREG) 12.5 MG tablet Take 12.5 mg by mouth 2 (two) times daily with a meal.  . meloxicam (MOBIC) 15 MG tablet Take 15 mg by mouth daily.  Marland Kitchen omega-3 acid ethyl esters (LOVAZA) 1 g capsule Take 1 g by mouth 2 (two) times daily.  . pravastatin (PRAVACHOL) 40 MG tablet Take 40 mg by mouth daily.  Marland Kitchen umeclidinium bromide (INCRUSE ELLIPTA) 62.5 MCG/INH AEPB Inhale 1 puff into the lungs daily.  . fluticasone furoate-vilanterol (BREO ELLIPTA) 200-25 MCG/INH AEPB Inhale 1 puff into the lungs daily.  . fluticasone furoate-vilanterol (BREO ELLIPTA) 200-25 MCG/INH AEPB Inhale 1 puff into the lungs daily.  Marland Kitchen umeclidinium bromide (INCRUSE ELLIPTA) 62.5  MCG/INH AEPB Inhale 1 puff into the lungs daily.  Marland Kitchen umeclidinium bromide (INCRUSE ELLIPTA) 62.5 MCG/INH AEPB Inhale 1 puff into the lungs daily.   No facility-administered encounter medications on file as of 06/22/2016.      Review of Systems  Constitutional:   No  weight loss, night sweats,  Fevers, chills,  +fatigue, or  lassitude.  HEENT:   No headaches,  Difficulty swallowing,  Tooth/dental problems, or  Sore throat,                No sneezing, itching, ear ache, nasal congestion, post nasal drip,   CV:  No chest pain,  Orthopnea, PND, swelling in lower extremities, anasarca, dizziness, palpitations, syncope.   GI  No heartburn, indigestion, abdominal pain, nausea, vomiting, diarrhea, change in bowel habits, loss of appetite, bloody stools.   Resp:   No wheezing.  No chest wall deformity  Skin: no rash or lesions.  GU: no dysuria, change in color of urine, no urgency or frequency.  No flank pain, no hematuria   MS:  No joint pain or swelling.  No decreased range of motion.  No back pain.    Physical Exam  BP 132/76 (BP Location: Left Arm, Cuff Size: Normal)   Pulse 81   Ht 5\' 6"  (1.676 m)   Wt 203 lb (92.1 kg)   SpO2 95%   BMI 32.77 kg/m   GEN: A/Ox3; pleasant , NAD,  Elderly    HEENT:  [place]/AT,  EACs-clear, TMs-wnl, NOSE-clear, THROAT-clear, no lesions, no postnasal drip or exudate noted.   NECK:  Supple w/ fair ROM; no JVD; normal carotid impulses w/o bruits; no thyromegaly or nodules palpated; no lymphadenopathy.    RESP  Decreased BS in bases , . no accessory muscle use, no dullness to percussion  CARD:  RRR, no m/r/g,1+ peripheral edema, pulses intact, no cyanosis or clubbing.  GI:   Soft & nt; nml bowel sounds; no organomegaly or masses detected.   Musco: Warm bil, no deformities or joint swelling noted.   Neuro: alert, no focal deficits noted.    Skin: Warm, no lesions or rashes  Psych:  No change in mood or affect. No depression or anxiety.  No memory  loss.  Lab Results:  CBC No results found for: WBC, RBC, HGB, HCT, PLT, MCV, MCH, MCHC, RDW, LYMPHSABS, MONOABS, EOSABS, BASOSABS  BMETNo results found for: BNP  ProBNP No results found for: PROBNP  Imaging: No results found.   Assessment & Plan:   COPD (chronic obstructive pulmonary disease) More symptomatic off triple inhaler use.  Add BREO to his INCRUSE   Plan  Patient Instructions  Continue on INCRUSE 1 puff daily  Begin BREO 1 puff daily  Set up for ONO on room air to see if you still need oxygen .  follow up Dr. Elsworth Soho  In 4-6 months and As needed   Please contact office for sooner follow up if symptoms do not improve or worsen or seek emergency care       Chronic respiratory failure ([place]) Check ONO to see if O2 is still needed.       Rexene Edison, NP 06/22/2016

## 2016-06-28 NOTE — Progress Notes (Signed)
Reviewed & agree with plan  

## 2016-07-04 ENCOUNTER — Encounter: Payer: Self-pay | Admitting: Adult Health

## 2016-07-14 ENCOUNTER — Telehealth: Payer: Self-pay | Admitting: Adult Health

## 2016-07-14 DIAGNOSIS — J449 Chronic obstructive pulmonary disease, unspecified: Secondary | ICD-10-CM

## 2016-07-14 NOTE — Telephone Encounter (Signed)
Spoke with pt. He is aware of results. Order will be sent to [facility] to discontinue oxygen.

## 2016-07-14 NOTE — Telephone Encounter (Signed)
2.27.18 ONO results received from [facility]  Per TP: may stop O2, no desats on ONO.  LMOM TCB x1 to discuss results with patient.

## 2016-12-21 ENCOUNTER — Ambulatory Visit (INDEPENDENT_AMBULATORY_CARE_PROVIDER_SITE_OTHER): Payer: Medicare HMO | Admitting: Pulmonary Disease

## 2016-12-21 ENCOUNTER — Encounter: Payer: Self-pay | Admitting: Pulmonary Disease

## 2016-12-21 DIAGNOSIS — J479 Bronchiectasis, uncomplicated: Secondary | ICD-10-CM

## 2016-12-21 DIAGNOSIS — J432 Centrilobular emphysema: Secondary | ICD-10-CM | POA: Diagnosis not present

## 2016-12-21 MED ORDER — FLUTICASONE-UMECLIDIN-VILANT 100-62.5-25 MCG/INH IN AEPB
1.0000 | INHALATION_SPRAY | Freq: Every day | RESPIRATORY_TRACT | 0 refills | Status: DC
Start: 2016-12-21 — End: 2017-02-09

## 2016-12-21 NOTE — Assessment & Plan Note (Signed)
Focal - does not need chronic antibiotics He will call for antibiotics if he  develops green and yellow sputum

## 2016-12-21 NOTE — Progress Notes (Signed)
   Subjective:    Patient ID: Eric Herring, male    DOB: 10-Feb-1935, 82 y.o.   MRN: [id number]  HPI  82 y.o ex smoker for FU of COPd & bronchiectasis  He quit smoking in 2009, about 30 Pyrs, was also exposed to sand dust in his occupation.  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   Dyspnea is at baseline, he denies wheezing. He has a head cold and reports clear sputum, no fevers, no sick contacts. He is maintained on breo & Incruse and complaints that co-pay is very high He also has albuterol nebs to use on an as-needed basis but only uses this seldom    Significant tests/ events  Echo 10/2010 showed EF 60%, mild LVH, mild -moderate PHT   CT chest in 06/2012 has shown resolution of the LLL consolidation. Extensive changes of emphysema with bullae & blebs were noted, a 6 mm non calcified nodule was stable in the RML when compared to CT from 10/2010. Bronchiectatic changes were also noted in the LLL with scarring.    Spirometry 2014 - FEV1 of 34% (1.02) with FVC of 58% 2.32)  -completed pulm rehab  ONO - did not qualify for O2  Arlyce Harman 05/2015 FEV1 41%   Review of Systems Patient denies significant dyspnea,cough, hemoptysis,  chest pain, palpitations, pedal edema, orthopnea, paroxysmal nocturnal dyspnea, lightheadedness, nausea, vomiting, abdominal or  leg pains      Objective:   Physical Exam  Gen. Pleasant, well-nourished, in no distress ENT - no thrush, no post nasal drip Neck: No JVD, no thyromegaly, no carotid bruits Lungs: no use of accessory muscles, no dullness to percussion, clear without rales or rhonchi  Cardiovascular: Rhythm regular, heart sounds  normal, no murmurs or gallops, no peripheral edema Musculoskeletal: No deformities, no cyanosis or clubbing        Assessment & Plan:

## 2016-12-21 NOTE — Assessment & Plan Note (Signed)
Trial of TRELEGY - take instead of Breo & Incruse  Call for prescription if this works

## 2016-12-21 NOTE — Patient Instructions (Addendum)
Trial of TRELEGY - take instead of Breo & Incruse  Call for prescription if this works

## 2016-12-21 NOTE — Addendum Note (Signed)
Addended by: Valerie Salts on: 12/21/2016 04:33 PM   Modules accepted: Orders

## 2017-01-15 ENCOUNTER — Telehealth: Payer: Self-pay | Admitting: Pulmonary Disease

## 2017-01-15 NOTE — Telephone Encounter (Signed)
I have mailed to the pts home a copy of the pt assistance forms and advised his wife that these will need to be completed and mailed back to our office.  Will forward to CP to follow up on forms.

## 2017-01-29 NOTE — Telephone Encounter (Signed)
Will sign off on this message and a new message can be created when these forms have been mailed back to [place].

## 2017-02-09 ENCOUNTER — Other Ambulatory Visit: Payer: Self-pay

## 2017-02-09 ENCOUNTER — Telehealth: Payer: Self-pay | Admitting: Pulmonary Disease

## 2017-02-09 MED ORDER — FLUTICASONE-UMECLIDIN-VILANT 100-62.5-25 MCG/INH IN AEPB
1.0000 | INHALATION_SPRAY | Freq: Every day | RESPIRATORY_TRACT | 12 refills | Status: DC
Start: 2017-02-09 — End: 2017-09-18

## 2017-02-09 NOTE — Telephone Encounter (Signed)
Received [employer] forms for Trelegy. Will have RA sign these and fax to [place] patient assistance.

## 2017-02-14 ENCOUNTER — Telehealth: Payer: Self-pay | Admitting: Pulmonary Disease

## 2017-02-14 NOTE — Telephone Encounter (Signed)
[place] forms for Trelegy have been faxed to [place]. Will hold in my look-at folder for 2 weeks.

## 2017-07-13 ENCOUNTER — Encounter: Payer: Medicare HMO | Admitting: Vascular Surgery

## 2017-09-07 ENCOUNTER — Ambulatory Visit (INDEPENDENT_AMBULATORY_CARE_PROVIDER_SITE_OTHER): Payer: Medicare HMO | Admitting: Family Medicine

## 2017-09-07 ENCOUNTER — Encounter: Payer: Self-pay | Admitting: Family Medicine

## 2017-09-07 VITALS — BP 114/70 | HR 80 | Temp 97.7°F | Resp 16 | Ht 66.0 in | Wt 181.6 lb

## 2017-09-07 DIAGNOSIS — E1149 Type 2 diabetes mellitus with other diabetic neurological complication: Secondary | ICD-10-CM

## 2017-09-07 DIAGNOSIS — M1A9XX0 Chronic gout, unspecified, without tophus (tophi): Secondary | ICD-10-CM

## 2017-09-07 DIAGNOSIS — M19049 Primary osteoarthritis, unspecified hand: Secondary | ICD-10-CM

## 2017-09-07 DIAGNOSIS — R600 Localized edema: Secondary | ICD-10-CM | POA: Diagnosis not present

## 2017-09-07 HISTORY — DX: Type 2 diabetes mellitus with other diabetic neurological complication: E11.49

## 2017-09-07 NOTE — Patient Instructions (Addendum)
Stop Hydralazine for now. This could be contributing to the swelling in your lower extremities.  Try to wear compression stockings.   Mind the salt intake. Stay active.  Check blood pressure at home. Let us know if your readings are high before your next appointment.  Let us know if you need anything.

## 2017-09-07 NOTE — Progress Notes (Signed)
Chief Complaint  Patient presents with  . New Patient (Initial Visit)    trouble breathing, pulmonologist Dr. Elsworth Soho  . Arthritis    several years, hand pain, feet swelling bilateral       New Patient Visit SUBJECTIVE: HPI: Eric Herring is an 83 y.o.male who is being seen for establishing care.  The patient was previously seen at [facility]  For approximately several months, he has been having swelling in both of his lower extremities.  He does not have a history of heart failure.  He has compression stockings, but does not wear them routinely due to difficulty of getting them on.  He will elevate his legs which does help.  He does not consume a large amount of sodium.  No calf pain.  B/l hand pain for several years. Icy Hot helps.  Does not use any oral medications and does not wish to.  No swelling, does have some nodules.  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  He has a history of well-controlled diabetes with neuropathy.  He did have recent blood work done with his previous provider.  He has a history of gout that is managed without medication.  Allergies  Allergen Reactions  . Lisinopril Swelling    Tongue and throat swelling     Past Medical History:  Diagnosis Date  . Allergy   . Arthropathy   . CAD (coronary artery disease)   . Cardiomyopathy ([place])   . Chronic kidney disease   . Colon polyp   . Diabetes ([place])   . Diabetic neuropathy ([place])   . DOE (dyspnea on exertion)   . Heart murmur   . Hypercholesterolemia   . Hypertension    pulmonary  . LVH (left ventricular hypertrophy)   . Lymphoma ([place])   . Mitral regurgitation   . Prostate cancer ([place])   . Pulmonary hypertension ([place])   . Tricuspid regurgitation    Past Surgical History:  Procedure Laterality Date  . HEMORROIDECTOMY     pt does not remember year   Family History  Family history unknown: Yes   Allergies  Allergen Reactions  . Lisinopril Swelling    Tongue  and throat swelling     Current Outpatient Medications:  .  albuterol (PROVENTIL HFA;VENTOLIN HFA) 108 (90 BASE) MCG/ACT inhaler, Inhale 2 puffs into the lungs 2 (two) times daily., Disp: , Rfl:  .  albuterol (PROVENTIL) (2.5 MG/3ML) 0.083% nebulizer solution, Take 2.5 mg by nebulization 2 (two) times daily., Disp: , Rfl:  .  aspirin 81 MG tablet, Take 81 mg by mouth daily., Disp: , Rfl:  .  Calcium Carb-Cholecalciferol (CALCIUM-VITAMIN D) 500-200 MG-UNIT tablet, Take by mouth., Disp: , Rfl:  .  Fluticasone-Umeclidin-Vilant (TRELEGY ELLIPTA) 100-62.5-25 MCG/INH AEPB, Inhale 1 puff into the lungs daily., Disp: 60 each, Rfl: 12 .  isosorbide mononitrate (IMDUR) 60 MG 24 hr tablet, Take 60 mg by mouth daily., Disp: , Rfl:  .  metoprolol succinate (TOPROL-XL) 50 MG 24 hr tablet, Take 50 mg by mouth daily., Disp: , Rfl:  .  Multiple Vitamin (MULTIVITAMIN) tablet, Take by mouth., Disp: , Rfl:  .  pravastatin (PRAVACHOL) 40 MG tablet, Take 40 mg by mouth daily., Disp: , Rfl:   ROS Cardiovascular: Denies chest pain  Respiratory: Denies dyspnea   OBJECTIVE: BP 114/70 (BP Location: Right Arm, Patient Position: Sitting, Cuff Size: Normal)   Pulse 80   Temp 97.7 F (36.5 C) (Oral)  Resp 16   Ht 5\' 6"  (1.676 m)   Wt 181 lb 9.6 oz (82.4 kg)   SpO2 98%   BMI 29.31 kg/m   Constitutional: -  VS reviewed -  Well developed, well nourished, appears stated age -  No apparent distress  Psychiatric: -  Oriented to person, place, and time -  Memory intact -  Affect and mood normal -  Fluent conversation, good eye contact -  Judgment and insight age appropriate  Eye: -  Conjunctivae clear, no discharge -  Pupils symmetric, round, reactive to light  ENMT: -  MMM    Pharynx moist, no exudate, no erythema  Neck: -  No gross swelling, no palpable masses -  Thyroid midline, not enlarged, mobile, no palpable masses  Cardiovascular: -  RRR -  3+ pitting bilateral LE edema tapering up to proximal third  of tibia  Respiratory: -  Normal respiratory effort, no accessory muscle use, no retraction -  Breath sounds equal, no wheezes, no ronchi, no crackles  Musculoskeletal: -  No clubbing, no cyanosis -  Gait normal  Skin: -  No significant lesion on inspection -  Warm and dry to palpation   ASSESSMENT/PLAN: Localized edema  Chronic gout without tophus, unspecified cause, unspecified site  Arthritis pain, hand  Type 2 diabetes mellitus with neurological complications (HCC)  Patient instructed to sign release of records form from his previous PCP. Compression stockings, elevate legs, mind salt intake stay physically active. Continue home remedies, ice, Tylenol for OA. Stop Hydralazine for now, this could be contributing to issue.  Patient should return 3 mo for DM visit, I will await records as well. The patient voiced understanding and agreement to the plan.   [place], DO 09/07/17  8:31 AM

## 2017-09-17 ENCOUNTER — Ambulatory Visit: Payer: Self-pay

## 2017-09-17 NOTE — Telephone Encounter (Signed)
Pt with multiple complaints.  COPD pt c/o 3 month h/o SOB. Pt feels like the pollen has caused his COPD to exacerbate. Moderate is severity. No cough but has SOB at rest and with exertion. Pt is able to talk in full sentences. Pt also c/o bilateral lower leg and foot edema. Pt denies pain but states that edema began a year ago. Severity is moderate. Pt thinks that gout is causing his sx.  Pt also c/o hand numbness a 1 year. Pt states that he cannot feel anything if he is holding something in his hands and cannot fasten the buttons on his shirt. Appt made for tomorrow am with PCP. Care advice given.  Reason for Disposition . [1] MODERATE longstanding difficulty breathing (e.g., speaks in phrases, SOB even at rest, pulse 100-120) AND [2] SAME as normal . [1] MODERATE leg swelling (e.g., swelling extends up to knees) AND [2] new onset or worsening  Answer Assessment - Initial Assessment Questions 1. RESPIRATORY STATUS: "Describe your breathing?" (e.g., wheezing, shortness of breath, unable to speak, severe coughing)      Shortness of breath at rest and with exertion 2. ONSET: "When did this breathing problem begin?"      Last few months 3. PATTERN "Does the difficult breathing come and go, or has it been constant since it started?"      constant 4. SEVERITY: "How bad is your breathing?" (e.g., mild, moderate, severe)    - MILD: No SOB at rest, mild SOB with walking, speaks normally in sentences, can lay down, no retractions, pulse < 100.    - MODERATE: SOB at rest, SOB with minimal exertion and prefers to sit, cannot lie down flat, speaks in phrases, mild retractions, audible wheezing, pulse 100-120.    - SEVERE: Very SOB at rest, speaks in single words, struggling to breathe, sitting hunched forward, retractions, pulse > 120      moderate 5. RECURRENT SYMPTOM: "Have you had difficulty breathing before?" If so, ask: "When was the last time?" and "What happened that time?"      Yes last summer-  would put self on O2 and inhalers 6. CARDIAC HISTORY: "Do you have any history of heart disease?" (e.g., heart attack, angina, bypass surgery, angioplasty)      No- irregular heartbeat 7. LUNG HISTORY: "Do you have any history of lung disease?"  (e.g., pulmonary embolus, asthma, emphysema)     COPD 8. CAUSE: "What do you think is causing the breathing problem?"      Pollen- allergies 9. OTHER SYMPTOMS: "Do you have any other symptoms? (e.g., dizziness, runny nose, cough, chest pain, fever)     none 10. PREGNANCY: "Is there any chance you are pregnant?" "When was your last menstrual period?"       n/a 11. TRAVEL: "Have you traveled out of the country in the last month?" (e.g., travel history, exposures)       no  Answer Assessment - Initial Assessment Questions 1. ONSET: "When did the swelling start?" (e.g., minutes, hours, days)     1 year ago 2. LOCATION: "What part of the leg is swollen?"  "Are both legs swollen or just one leg?"     Both feet 3. SEVERITY: "How bad is the swelling?" (e.g., localized; mild, moderate, severe)  - Localized - small area of swelling localized to one leg  - MILD pedal edema - swelling limited to foot and ankle, pitting edema < 1/4 inch (6 mm) deep, rest and elevation eliminate most or all  swelling  - MODERATE edema - swelling of lower leg to knee, pitting edema > 1/4 inch (6 mm) deep, rest and elevation only partially reduce swelling  - SEVERE edema - swelling extends above knee, facial or hand swelling present     moderate 4. REDNESS: "Does the swelling look red or infected?"     no 5. PAIN: "Is the swelling painful to touch?" If so, ask: "How painful is it?"   (Scale 1-10; mild, moderate or severe)     no 6. FEVER: "Do you have a fever?" If so, ask: "What is it, how was it measured, and when did it start?"      no 7. CAUSE: "What do you think is causing the leg swelling?"     gout 8. MEDICAL HISTORY: "Do you have a history of heart failure, kidney  disease, liver failure, or cancer?"     no 9. RECURRENT SYMPTOM: "Have you had leg swelling before?" If so, ask: "When was the last time?" "What happened that time?"     No it started 1 year ago 10. OTHER SYMPTOMS: "Do you have any other symptoms?" (e.g., chest pain, difficulty breathing)       Shortness, hand numbness 11. PREGNANCY: "Is there any chance you are pregnant?" "When was your last menstrual period?"       n/a  Protocols used: BREATHING DIFFICULTY-A-AH, LEG SWELLING AND EDEMA-A-AH

## 2017-09-18 ENCOUNTER — Ambulatory Visit (INDEPENDENT_AMBULATORY_CARE_PROVIDER_SITE_OTHER): Payer: Medicare HMO | Admitting: Family Medicine

## 2017-09-18 ENCOUNTER — Telehealth: Payer: Self-pay | Admitting: Family Medicine

## 2017-09-18 ENCOUNTER — Telehealth: Payer: Self-pay | Admitting: Pulmonary Disease

## 2017-09-18 ENCOUNTER — Encounter: Payer: Self-pay | Admitting: Family Medicine

## 2017-09-18 VITALS — BP 120/82 | HR 96 | Temp 98.4°F | Ht 66.0 in | Wt 186.1 lb

## 2017-09-18 DIAGNOSIS — R202 Paresthesia of skin: Secondary | ICD-10-CM | POA: Diagnosis not present

## 2017-09-18 DIAGNOSIS — R600 Localized edema: Secondary | ICD-10-CM

## 2017-09-18 DIAGNOSIS — L602 Onychogryphosis: Secondary | ICD-10-CM

## 2017-09-18 MED ORDER — FUROSEMIDE 40 MG PO TABS: 40.0000 mg | ORAL_TABLET | Freq: Every day | ORAL | 3 refills | Status: DC

## 2017-09-18 MED ORDER — POTASSIUM CHLORIDE CRYS ER 10 MEQ PO TBCR: EXTENDED_RELEASE_TABLET | ORAL | 0 refills | Status: DC

## 2017-09-18 MED ORDER — BUDESONIDE-FORMOTEROL FUMARATE 160-4.5 MCG/ACT IN AERO: 2.0000 | INHALATION_SPRAY | Freq: Two times a day (BID) | RESPIRATORY_TRACT | 3 refills | Status: DC

## 2017-09-18 NOTE — Progress Notes (Signed)
Chief Complaint  Patient presents with  . Shortness of Breath  . Edema    legs  . Numbness    hands    Subjective: Patient is a 83 y.o. male here for shortness of breath.  He is here with his daughter.  The patient has a history of COPD requiring long-acting muscarinic antagonist, LABA, and ICS.  Unfortunately, he has not been able to get the inhaler called in by his pulmonology team due to insurance coverage.  He is currently taking Breo.  He is having shortness of breath requiring his rescue medicine up to 3 times daily.  That will help.  He also continues to have swelling in his lower extremities.  He is minding his salt intake and trying to elevate his legs.  He is wearing compression stockings today.  Walking hurts, more so when his feet are swollen.  While he has a history of CAD, no history of CHF.  He does not remember when his last echocardiogram was.  He does admit to shortness of breath, however he is not well controlled with a COPD so this does cause a picture.  He reports some numbness/tingling in his fingertips.  This will come and go.  No weakness.   ROS: Heart: Denies chest pain  Lungs:+SOB   Family History  Family history unknown: Yes   Past Medical History:  Diagnosis Date  . Allergy   . Arthropathy   . CAD (coronary artery disease)   . Cardiomyopathy ([place])   . Chronic kidney disease   . Colon polyp   . COPD (chronic obstructive pulmonary disease) ([place])   . Diabetes ([place])   . Diabetic neuropathy ([place])   . DOE (dyspnea on exertion)   . Heart murmur   . Hypercholesterolemia   . Hypertension    pulmonary  . LVH (left ventricular hypertrophy)   . Lymphoma ([place])   . Mitral regurgitation   . Prostate cancer ([place])   . Pulmonary hypertension ([place])   . Tricuspid regurgitation    Allergies  Allergen Reactions  . Lisinopril Swelling    Tongue and throat swelling     Current Outpatient Medications:  .  aspirin 81 MG tablet, Take 81 mg by mouth daily.,  Disp: , Rfl:  .  Calcium Carb-Cholecalciferol (CALCIUM-VITAMIN D) 500-200 MG-UNIT tablet, Take by mouth., Disp: , Rfl:  .  isosorbide mononitrate (IMDUR) 60 MG 24 hr tablet, Take 60 mg by mouth daily., Disp: , Rfl:  .  metoprolol succinate (TOPROL-XL) 50 MG 24 hr tablet, Take 50 mg by mouth daily., Disp: , Rfl:  .  Multiple Vitamin (MULTIVITAMIN) tablet, Take by mouth., Disp: , Rfl:  .  pravastatin (PRAVACHOL) 40 MG tablet, Take 40 mg by mouth daily., Disp: , Rfl:  .  albuterol (PROVENTIL HFA;VENTOLIN HFA) 108 (90 BASE) MCG/ACT inhaler, Inhale 2 puffs into the lungs 2 (two) times daily., Disp: , Rfl:  .  albuterol (PROVENTIL) (2.5 MG/3ML) 0.083% nebulizer solution, Take 2.5 mg by nebulization 2 (two) times daily., Disp: , Rfl:  .  Fluticasone-Umeclidin-Vilant (TRELEGY ELLIPTA) 100-62.5-25 MCG/INH AEPB, Inhale 1 puff into the lungs daily. (Patient not taking: Reported on 09/18/2017), Disp: 60 each, Rfl: 12 .  furosemide (LASIX) 40 MG tablet, Take 1 tablet (40 mg total) by mouth daily., Disp: 30 tablet, Rfl: 3 .  potassium chloride (KLOR-CON M10) 10 MEQ tablet, Take 2 tabs with every dose of Lasix., Disp: 60 tablet, Rfl: 0  Objective: BP 120/82 (BP Location: Left Arm, Patient Position:  Sitting, Cuff Size: Normal)   Pulse 96   Temp 98.4 F (36.9 C) (Oral)   Ht 5\' 6"  (1.676 m)   Wt 186 lb 2 oz (84.4 kg)   SpO2 95%   BMI 30.04 kg/m  General: Awake, appears stated age HEENT: MMM Heart: RRR, 3+ BLE up to knee. MSK: No calf ttp, no tenderness over the bony landmarks of the foot bilaterally Lungs: CTAB, no rales, wheezes or rhonchi. No accessory muscle use Skin: Nails of feet are hypertrophic and discolored throughout Psych: normal affect and mood  Assessment and Plan: Lower extremity edema - Plan: Comprehensive metabolic panel, CBC, TSH, furosemide (LASIX) 40 MG tablet, potassium chloride (KLOR-CON M10) 10 MEQ tablet  Paresthesia of hand, bilateral - Plan: Hemoglobin A1c, TSH,  B12  Overgrown toenails - Plan: Ambulatory referral to Podiatry  Orders as above. We will empirically start a diuretic along with the potassium supplement.  I did warn the patient and his daughter that this may not be helpful if it is unrelated to the heart.  Given his heart history in addition to shortness of breath, would want to rule out volume overload as a cause.  Continue to elevate legs and use compression.  Try to stay active in mind salt intake. Check above labs. Refer to podiatry for nail trimming and potential evaluation of foot pain. I did get him a payment card for Symbicort.  Because he does have Medicare, I am unsure if this will work.  I did instruct him to follow-up with Dr. Bari Mantis office to see if they have any samples as a first-line option and let me know if they do not have samples or a way to get him the inhalers at a reasonable price. Follow-up in 1 week for lab check, 1 month with me. The patient and his daughter voiced understanding and agreement to the plan.  [place], DO 09/18/17  9:13 AM

## 2017-09-18 NOTE — Telephone Encounter (Signed)
Called informed the patients wife of PCP instructions. She did verbalized instructions. I gave her Dr. Bari Mantis office number to call first regarding samples/she agreed to instructions.

## 2017-09-18 NOTE — Patient Instructions (Addendum)
If you do not hear anything about your podiatry referral in the next 1-2 weeks, call our office and ask for an update.  Try to stay physically active as this can help with the swelling. Continue wearing the compression stockings, mind the salt intake and elevate the legs.   Remember to take the water pill with the potassium supplement. You will need to start urinating 15-20 minutes after taking the medication.  Get in contact with Dr. Bari Mantis office regarding samples for inhaler. If the pulmonology team has no answers, let me know and I will call in the inhaler associated with the payment card.  We will be in touch regarding lab results.

## 2017-09-18 NOTE — Telephone Encounter (Signed)
Spoke with pt, advised he could pick up samples in [place]. Nothing further is needed.

## 2017-09-18 NOTE — Progress Notes (Signed)
Pre visit review using our clinic review tool, if applicable. No additional management support is needed unless otherwise documented below in the visit note. 

## 2017-09-18 NOTE — Telephone Encounter (Signed)
Copied from [place] #[id number]. Topic: Inquiry >> Sep 18, 2017  9:51 AM Conception Chancy, NT wrote: Reason for CRM: patient family is at the pharmacy to get his medications and states was given a slip with free symbicort but the pharmacy states it was not sent over with his other medications. Please advise.

## 2017-09-18 NOTE — Telephone Encounter (Signed)
Spoke with pt, advised him that we did have Breo samples. I wanted to clarify that he is taking Breo until he receives patient assistance for the Trelegy. Pt agreed. He requested to pick up samples in [facility]. I will see if the nurse can bring them with her when she goes to [place] tomorrow.   He would like RA to call him about getting more money for his retirement since he was diagnosed with cancer. I tried to get more information but he states RA knows what he is talking about. He would like a return call tomorrow.

## 2017-09-18 NOTE — Telephone Encounter (Signed)
I called it in, but wanted to see if Dr. Elsworth Soho had samples. If he did, then I don't want Rondall on multiple inhalers doing similar things.

## 2017-09-20 ENCOUNTER — Encounter: Payer: Self-pay | Admitting: Adult Health

## 2017-09-20 ENCOUNTER — Ambulatory Visit (INDEPENDENT_AMBULATORY_CARE_PROVIDER_SITE_OTHER): Payer: Medicare HMO | Admitting: Adult Health

## 2017-09-20 ENCOUNTER — Ambulatory Visit (HOSPITAL_BASED_OUTPATIENT_CLINIC_OR_DEPARTMENT_OTHER)
Admission: RE | Admit: 2017-09-20 | Discharge: 2017-09-20 | Disposition: A | Discharge status: Home / Self Care | Payer: Medicare HMO | Source: Ambulatory Visit | Attending: Adult Health | Admitting: Adult Health

## 2017-09-20 VITALS — BP 128/86 | HR 93 | Ht 66.0 in | Wt 184.0 lb

## 2017-09-20 DIAGNOSIS — J432 Centrilobular emphysema: Secondary | ICD-10-CM | POA: Diagnosis not present

## 2017-09-20 DIAGNOSIS — R911 Solitary pulmonary nodule: Secondary | ICD-10-CM | POA: Diagnosis not present

## 2017-09-20 DIAGNOSIS — J479 Bronchiectasis, uncomplicated: Secondary | ICD-10-CM

## 2017-09-20 DIAGNOSIS — J449 Chronic obstructive pulmonary disease, unspecified: Secondary | ICD-10-CM | POA: Insufficient documentation

## 2017-09-20 NOTE — Progress Notes (Signed)
@Patient  ID: Eric Herring, male    DOB: Jul 25, 1934, 83 y.o.   MRN: [id number]  Chief Complaint  Patient presents with  . Follow-up    COPD     Referring provider: Shelda Pal*  HPI: 83  y.o ex smoker for FU of COPd &bronchiectasis  Past medical history significant for lymphoma (2002), nasopharyngeal squamous cell carcinoma, prostate cancer 2011 and chronic kidney disease   TEST /Events  Previous pulmonary care with Dr. Verdie Mosher in [facility]. CT chest February 2014 showed resolution of left lower lobe consolidation.  Extensive changes of emphysema with bullae and blebs.  6 mm nodule stable in the right middle lobe bronchiectatic changes in the left lower lobe. Echo 10/2010 showed EF 60%, mild LVH, mild -moderate PHT  He quit smoking in 2009, about 30 Pyrs, was also exposed to sand dust in his occupation.   Spirometry 2014 - FEV1 of 34% (1.02) with FVC of 58% 2.32)  -completed pulm rehab    09/20/2017 Follow up : COPD and Bronchiectasis  Patient presents for follow up . Last seen 12/2017 .  Patient says overall breathing has been doing about the same.  He has shortness of breath with activity  and intermittent cough.  He has trouble affording his inhalers.  Depends on samples.  He does like Trelegy but says he cannot afford this.  Patient assistance paperwork has been done for this but he has not heard anything back to see if he is been approved. He denies any hemoptysis, chest pain, orthopnea or increased edema   Allergies  Allergen Reactions  . Lisinopril Swelling    Tongue and throat swelling     Immunization History  Administered Date(s) Administered  . Influenza Split 02/15/2013  . Influenza Whole 03/01/2012  . Influenza, High Dose Seasonal PF 02/20/2016  . Influenza,inj,Quad PF,6+ Mos 01/07/2015  . Influenza-Unspecified 02/05/2014    Past Medical History:  Diagnosis Date  . Allergy   . Arthropathy   . CAD (coronary artery disease)   .  Cardiomyopathy ([place])   . Chronic kidney disease   . Colon polyp   . COPD (chronic obstructive pulmonary disease) ([place])   . Diabetes ([place])   . Diabetic neuropathy ([place])   . DOE (dyspnea on exertion)   . Heart murmur   . Hypercholesterolemia   . Hypertension    pulmonary  . LVH (left ventricular hypertrophy)   . Lymphoma ([place])   . Mitral regurgitation   . Prostate cancer ([place])   . Pulmonary hypertension ([place])   . Tricuspid regurgitation     Tobacco History: Social History   Tobacco Use  Smoking Status Former Smoker  . Packs/day: 1.00  . Years: 25.00  . Pack years: 25.00  . Types: Cigarettes  . Last attempt to quit: 11/30/2007  . Years since quitting: 9.8  Smokeless Tobacco Never Used   Counseling given: Not Answered   Outpatient Encounter Medications as of 09/20/2017  Medication Sig  . albuterol (PROVENTIL HFA;VENTOLIN HFA) 108 (90 BASE) MCG/ACT inhaler Inhale 2 puffs into the lungs 2 (two) times daily.  Marland Kitchen albuterol (PROVENTIL) (2.5 MG/3ML) 0.083% nebulizer solution Take 2.5 mg by nebulization 2 (two) times daily.  Marland Kitchen aspirin 81 MG tablet Take 81 mg by mouth daily.  . budesonide-formoterol (SYMBICORT) 160-4.5 MCG/ACT inhaler Inhale 2 puffs into the lungs 2 (two) times daily.  . Calcium Carb-Cholecalciferol (CALCIUM-VITAMIN D) 500-200 MG-UNIT tablet Take by mouth.  . furosemide (LASIX) 40 MG tablet Take 1 tablet (40  mg total) by mouth daily.  . isosorbide mononitrate (IMDUR) 60 MG 24 hr tablet Take 60 mg by mouth daily.  . Multiple Vitamin (MULTIVITAMIN) tablet Take by mouth.  . potassium chloride (KLOR-CON M10) 10 MEQ tablet Take 2 tabs with every dose of Lasix.  Marland Kitchen pravastatin (PRAVACHOL) 40 MG tablet Take 40 mg by mouth daily.  . [DISCONTINUED] metoprolol succinate (TOPROL-XL) 50 MG 24 hr tablet Take 50 mg by mouth daily.   No facility-administered encounter medications on file as of 09/20/2017.      Review of Systems  Constitutional:   No  weight loss, night sweats,   Fevers, chills,  +fatigue, or  lassitude.  HEENT:   No headaches,  Difficulty swallowing,  Tooth/dental problems, or  Sore throat,                No sneezing, itching, ear ache, nasal congestion, post nasal drip,   CV:  No chest pain,  Orthopnea, PND, swelling in lower extremities, anasarca, dizziness, palpitations, syncope.   GI  No heartburn, indigestion, abdominal pain, nausea, vomiting, diarrhea, change in bowel habits, loss of appetite, bloody stools.   Resp:    No chest wall deformity  Skin: no rash or lesions.  GU: no dysuria, change in color of urine, no urgency or frequency.  No flank pain, no hematuria   MS:  No joint pain or swelling.  No decreased range of motion.  No back pain.    Physical Exam  Ht 5\' 6"  (1.676 m)   Wt 184 lb (83.5 kg)   BMI 29.70 kg/m   GEN: A/Ox3; pleasant , NAD, elderly   HEENT:  [place]/AT,  EACs-clear, TMs-wnl, NOSE-clear, THROAT-clear, no lesions, no postnasal drip or exudate noted.   NECK:  Supple w/ fair ROM; no JVD; normal carotid impulses w/o bruits; no thyromegaly or nodules palpated; no lymphadenopathy.    RESP decreased breath sounds in the bases no accessory muscle use, no dullness to percussion  CARD:  RRR, no m/r/g, no peripheral edema, pulses intact, no cyanosis or clubbing.  GI:   Soft & nt; nml bowel sounds; no organomegaly or masses detected.   Musco: Warm bil, no deformities or joint swelling noted.   Neuro: alert, no focal deficits noted.    Skin: Warm, no lesions or rashes     BNP No results found for: BNP  ProBNP No results found for: PROBNP  Imaging: No results found.   Assessment & Plan:   No problem-specific Assessment & Plan notes found for this encounter.     Rexene Edison, NP 09/20/2017

## 2017-09-20 NOTE — Patient Instructions (Addendum)
Continue on BREO and INCRUSE daily -if out of samples  can use TRELEGY daily (do not use all three together )  We will check on the [facility] paperwork for [place] .  Chest xray today  Follow up with Dr. Elsworth Soho  In 6 months and As needed

## 2017-09-21 ENCOUNTER — Telehealth: Payer: Self-pay | Admitting: Adult Health

## 2017-09-21 NOTE — Assessment & Plan Note (Signed)
Compensated without flare  Check cxr   Plan  Patient Instructions  Continue on BREO and INCRUSE daily -if out of samples  can use TRELEGY daily (do not use all three together )  We will check on the [facility] paperwork for [place] .  Chest xray today  Follow up with Dr. Elsworth Soho  In 6 months and As needed

## 2017-09-21 NOTE — Assessment & Plan Note (Signed)
Severe COPD.  Patient education on medication compliance.  Will look into patient assistance for Trelegy.  Plan  Patient Instructions  Continue on BREO and INCRUSE daily -if out of samples  can use TRELEGY daily (do not use all three together )  We will check on the [facility] paperwork for [place] .  Chest xray today  Follow up with Dr. Elsworth Soho  In 6 months and As needed

## 2017-09-21 NOTE — Telephone Encounter (Signed)
Per TP: please investigate as to why patient cannot afford his Trelegy   Per pt's chart, patient assistance was initiated in Oct 2018 Called [place] and spoke with rep - pt's application was never completely processed because they did not receive all the necessary information >> proof that patient spent $600 out of pocket on medications and copy of insurance cards.  A new application and new Rx will have to be done.  The out of pocket information can be obtained from his pharmacy.   Called spoke with patient, explained the above.  Pt asked that I call back on Tuesday 5/21 to speak with his wife who is now out of town so that we can continue the conversation.  Will do so.

## 2017-10-10 NOTE — Telephone Encounter (Signed)
lmtcb for pt.  

## 2017-10-17 NOTE — Telephone Encounter (Signed)
LMTCB

## 2017-10-18 ENCOUNTER — Encounter: Payer: Self-pay | Admitting: Family Medicine

## 2017-10-18 ENCOUNTER — Ambulatory Visit (INDEPENDENT_AMBULATORY_CARE_PROVIDER_SITE_OTHER): Payer: Medicare HMO | Admitting: Family Medicine

## 2017-10-18 VITALS — BP 108/78 | HR 103 | Temp 98.6°F | Ht 66.0 in | Wt 170.4 lb

## 2017-10-18 DIAGNOSIS — R600 Localized edema: Secondary | ICD-10-CM | POA: Diagnosis not present

## 2017-10-18 DIAGNOSIS — J432 Centrilobular emphysema: Secondary | ICD-10-CM | POA: Diagnosis not present

## 2017-10-18 DIAGNOSIS — L609 Nail disorder, unspecified: Secondary | ICD-10-CM | POA: Diagnosis not present

## 2017-10-18 LAB — COMPREHENSIVE METABOLIC PANEL
ALT: 32 U/L (ref 0–53)
AST: 30 U/L (ref 0–37)
Albumin: 4.1 g/dL (ref 3.5–5.2)
Alkaline Phosphatase: 86 U/L (ref 39–117)
BUN: 22 mg/dL (ref 6–23)
CALCIUM: 9.7 mg/dL (ref 8.4–10.5)
CHLORIDE: 104 meq/L (ref 96–112)
CO2: 28 meq/L (ref 19–32)
CREATININE: 1.83 mg/dL — AB (ref 0.40–1.50)
GFR: 45.9 mL/min — ABNORMAL LOW (ref >60.00)
Glucose, Bld: 69 mg/dL — ABNORMAL LOW (ref 70–99)
Potassium: 4 mEq/L (ref 3.5–5.1)
SODIUM: 140 meq/L (ref 135–145)
Total Bilirubin: 0.8 mg/dL (ref 0.2–1.2)
Total Protein: 7.1 g/dL (ref 6.0–8.3)

## 2017-10-18 LAB — CBC
HCT: 42.6 % (ref 39.0–52.0)
Hemoglobin: 14.2 g/dL (ref 13.0–17.0)
MCHC: 33.5 g/dL (ref 30.0–36.0)
MCV: 84.3 fl (ref 78.0–100.0)
Platelets: 133 10*3/uL — ABNORMAL LOW (ref 150.0–400.0)
RBC: 5.05 Mil/uL (ref 4.22–5.81)
RDW: 16.8 % — ABNORMAL HIGH (ref 11.5–15.5)
WBC: 3.2 10*3/uL — ABNORMAL LOW (ref 4.0–10.5)

## 2017-10-18 LAB — HEMOGLOBIN A1C: HEMOGLOBIN A1C: 6.4 % (ref 4.6–6.5)

## 2017-10-18 LAB — TSH: TSH: 2.67 u[IU]/mL (ref 0.35–4.50)

## 2017-10-18 LAB — VITAMIN B12: Vitamin B-12: 805 pg/mL (ref 211–911)

## 2017-10-18 MED ORDER — UMECLIDINIUM BROMIDE 62.5 MCG/INH IN AEPB: 1.0000 | INHALATION_SPRAY | Freq: Every day | RESPIRATORY_TRACT | 1 refills | Status: DC

## 2017-10-18 NOTE — Patient Instructions (Addendum)
Continue Lasix. We will be in touch regarding your results.   Mind the salt intake. Stay active. Continue with compression stockings.  If you do not hear anything about your echo in the next 1-2 weeks, call our office and ask for an update.  Let us know if you need anything.

## 2017-10-18 NOTE — Progress Notes (Signed)
Chief Complaint  Patient presents with  . Follow-up    Subjective: Patient is a 83 y.o. male here for f/u le edema.  Patient has a history of lower extremity edema possibly secondary to congestive heart failure.  He was started on Lasix 40 mg/d with adjunctive potassium.  He reports compliance with this.  Within 20 minutes, he needs to urinate and frequency continues throughout the day.  He has lost quite a bit of weight and feels much better with decreased swelling since starting the medicine.  He denies any chest pain or shortness of breath.  No side effects with the medicine.   ROS: Heart: +swelling Lungs: Denies SOB   Past Medical History:  Diagnosis Date  . Allergy   . Arthropathy   . CAD (coronary artery disease)   . Cardiomyopathy ([place])   . Chronic kidney disease   . Colon polyp   . COPD (chronic obstructive pulmonary disease) ([place])   . Diabetes ([place])   . Diabetic neuropathy ([place])   . DOE (dyspnea on exertion)   . Heart murmur   . Hypercholesterolemia   . Hypertension    pulmonary  . LVH (left ventricular hypertrophy)   . Lymphoma ([place])   . Mitral regurgitation   . Prostate cancer ([place])   . Pulmonary hypertension ([place])   . Tricuspid regurgitation       Objective: BP 108/78 (BP Location: Left Arm, Patient Position: Sitting, Cuff Size: Normal)   Pulse (!) 103   Temp 98.6 F (37 C) (Oral)   Ht 5\' 6"  (1.676 m)   Wt 170 lb 6 oz (77.3 kg)   SpO2 99%   BMI 27.50 kg/m  General: Awake, appears stated age HEENT: MMM, EOMi Heart: RRR, 1 + pitting edema b/l tapering at prox 1/3 of tibia Lungs: CTAB, no rales, wheezes or rhonchi. No accessory muscle use Psych: Age appropriate judgment and insight, normal affect and mood  Assessment and Plan: Localized edema - Plan: ECHOCARDIOGRAM COMPLETE  Centrilobular emphysema ([place]) - Plan: umeclidinium bromide (INCRUSE ELLIPTA) 62.5 MCG/INH AEPB  Nail abnormalities - Plan: Ambulatory referral to Podiatry  Check echo. He  needs follow-up labs.  Continue Lasix and potassium pending results of labs. Mind salt intake, elevate legs, compression. Refill Incruse. Follow-up in 6 months or as needed. The patient voiced understanding and agreement to the plan.  [place], DO 10/18/17  11:23 AM

## 2017-10-18 NOTE — Telephone Encounter (Signed)
Called patient, unable to reach at number provided. Left message to give [place] a call back.

## 2017-10-18 NOTE — Progress Notes (Signed)
Pre visit review using our clinic review tool, if applicable. No additional management support is needed unless otherwise documented below in the visit note. 

## 2017-10-19 ENCOUNTER — Ambulatory Visit: Payer: Self-pay | Admitting: Podiatry

## 2017-10-19 NOTE — Telephone Encounter (Signed)
Called and spoke with patients wife. I advised her of note below. Patients wife states that they will come in next Thursday to fill out new paperwork regarding the trelegy. I advised the patients wife that she will need to bring in financial information regarding how much the patient makes monthy, yearly. Patients wife also stated that the patient has not been taking this medication at all. Offered patient a sample. They stated that they would talk about picking this up on Thursday while filling out the forms. He does not want to pick up a sample at this time. I will route this to JJ to keep a follow up on patient coming in to fill out forms.

## 2017-10-24 ENCOUNTER — Ambulatory Visit: Payer: Medicare HMO | Admitting: Podiatry

## 2017-10-24 ENCOUNTER — Encounter: Payer: Self-pay | Admitting: Podiatry

## 2017-10-24 DIAGNOSIS — E1142 Type 2 diabetes mellitus with diabetic polyneuropathy: Secondary | ICD-10-CM

## 2017-10-24 DIAGNOSIS — B351 Tinea unguium: Secondary | ICD-10-CM | POA: Diagnosis not present

## 2017-10-24 DIAGNOSIS — Q828 Other specified congenital malformations of skin: Secondary | ICD-10-CM | POA: Diagnosis not present

## 2017-10-24 DIAGNOSIS — M79674 Pain in right toe(s): Secondary | ICD-10-CM | POA: Diagnosis not present

## 2017-10-24 DIAGNOSIS — M79675 Pain in left toe(s): Secondary | ICD-10-CM

## 2017-10-25 ENCOUNTER — Encounter: Payer: Self-pay | Admitting: Podiatry

## 2017-10-25 ENCOUNTER — Telehealth: Payer: Self-pay | Admitting: Adult Health

## 2017-10-25 MED ORDER — FLUTICASONE FUROATE-VILANTEROL 100-25 MCG/INH IN AEPB: 1.0000 | INHALATION_SPRAY | Freq: Every day | RESPIRATORY_TRACT | 0 refills | Status: DC

## 2017-10-25 NOTE — Progress Notes (Signed)
This patient presents to the office with chief complaint of long thick nails and painful calluses on his  diabetic feet.  This patient  says there  is  no pain and discomfort in his  feet.  This patient says there are long thick painful nails.  These nails are painful walking and wearing shoes.  He also says the calluses on both feet are painful walking.    Patient has no history of infection or drainage from both feet.  Patient is unable to  self treat  . This patient presents  to the office today for treatment of the  long nails and a foot evaluation due to history of  diabetes. and callus treatment. He is accompanied by his son and male.  They say he had severe swelling in both feet and ankles which is clearing up and improving.  General Appearance  Alert, conversant and in no acute stress.  Vascular  Dorsalis pedis and posterior tibial  pulses are palpable  bilaterally.  Capillary return is within normal limits  bilaterally. Temperature is within normal limits  bilaterally.  Neurologic  Senn-Weinstein monofilament wire test diminished   bilaterally. Muscle power within normal limits bilaterally.  Nails Thick disfigured discolored nails with subungual debris  from hallux to fifth toes bilaterally. No evidence of bacterial infection or drainage bilaterally.  Orthopedic  No limitations of motion of motion feet .  No crepitus or effusions noted.  No bony pathology or digital deformities noted. Swelling feet and legs  B/l.  Skin  normotropic skin noted bilaterally.  No signs of infections or ulcers noted.   Porokeratosis sub 4 left and sub 2 right.  Onychomycosis  Porokeratosis  B/L  IE  Debride nails x 10.  A diabetic foot exam was performed .   RTC 3 months.   Gardiner Barefoot DPM

## 2017-10-25 NOTE — Telephone Encounter (Signed)
Pt came into [facility] location to speak with TP regarding Breo 100 inhaler Pt states Trelegy is too expensive, and is not using it nor wants samples at this time Pt wants to stay on Breo 100 at this time, and would like assistance forms for this medication. Offered pt sample of Breo 100 today, provided him one sample today. Provided the forms to the patient today, and they will take them home to complete and return to [facility] location Once we rec'd the forms back in [facility] location, will fax the forms to be processed for approval  Will follow up in next week.

## 2017-10-25 NOTE — Telephone Encounter (Signed)
Per 10/25/17 phone note pt would like to switch back to Breo 100 Will sign off

## 2017-10-30 NOTE — Telephone Encounter (Signed)
Attempted to call patient today regarding if patient has completed the assistance forms. I did not receive an answer at time of call. I have left a voicemail message for pt to return call. X1

## 2017-11-01 ENCOUNTER — Ambulatory Visit (HOSPITAL_BASED_OUTPATIENT_CLINIC_OR_DEPARTMENT_OTHER)
Admission: RE | Admit: 2017-11-01 | Discharge: 2017-11-01 | Disposition: A | Discharge status: Home / Self Care | Payer: Medicare HMO | Source: Ambulatory Visit | Attending: Family Medicine | Admitting: Family Medicine

## 2017-11-01 DIAGNOSIS — I081 Rheumatic disorders of both mitral and tricuspid valves: Secondary | ICD-10-CM | POA: Diagnosis not present

## 2017-11-01 DIAGNOSIS — I272 Pulmonary hypertension, unspecified: Secondary | ICD-10-CM | POA: Diagnosis not present

## 2017-11-01 DIAGNOSIS — E119 Type 2 diabetes mellitus without complications: Secondary | ICD-10-CM | POA: Insufficient documentation

## 2017-11-01 DIAGNOSIS — R600 Localized edema: Secondary | ICD-10-CM | POA: Insufficient documentation

## 2017-11-01 DIAGNOSIS — J449 Chronic obstructive pulmonary disease, unspecified: Secondary | ICD-10-CM | POA: Diagnosis not present

## 2017-11-05 ENCOUNTER — Other Ambulatory Visit: Payer: Self-pay | Admitting: Family Medicine

## 2017-11-05 DIAGNOSIS — R931 Abnormal findings on diagnostic imaging of heart and coronary circulation: Secondary | ICD-10-CM

## 2017-11-05 NOTE — Progress Notes (Signed)
Referral placed.

## 2017-11-05 NOTE — Telephone Encounter (Signed)
Forward message to [facility] for review and to see if pt had questions about assistant forms.

## 2017-11-15 ENCOUNTER — Other Ambulatory Visit (HOSPITAL_BASED_OUTPATIENT_CLINIC_OR_DEPARTMENT_OTHER): Payer: Medicare HMO

## 2017-11-16 ENCOUNTER — Other Ambulatory Visit: Payer: Self-pay

## 2017-11-16 MED ORDER — FLUTICASONE FUROATE-VILANTEROL 100-25 MCG/INH IN AEPB: 1.0000 | INHALATION_SPRAY | Freq: Every day | RESPIRATORY_TRACT | 12 refills | Status: DC

## 2017-11-16 NOTE — Telephone Encounter (Signed)
rec'd [place] patient assistance forms from pt today completed and signed. Advised pt that forms need to be signed by RA; placed in RA cubby to be signed Once these are signed, they will need to be faxed to fax [phone] Pt verbalized understanding, and had no further questions nor concerns.  Routing message to cherina to follow up with pt.

## 2017-11-20 ENCOUNTER — Encounter: Payer: Self-pay | Admitting: Cardiology

## 2017-11-20 ENCOUNTER — Ambulatory Visit: Payer: Medicare HMO | Admitting: Cardiology

## 2017-11-20 VITALS — BP 120/62 | HR 87 | Ht 66.0 in | Wt 176.8 lb

## 2017-11-20 DIAGNOSIS — J9611 Chronic respiratory failure with hypoxia: Secondary | ICD-10-CM

## 2017-11-20 DIAGNOSIS — I272 Pulmonary hypertension, unspecified: Secondary | ICD-10-CM | POA: Diagnosis not present

## 2017-11-20 DIAGNOSIS — I420 Dilated cardiomyopathy: Secondary | ICD-10-CM

## 2017-11-20 DIAGNOSIS — R600 Localized edema: Secondary | ICD-10-CM

## 2017-11-20 DIAGNOSIS — I509 Heart failure, unspecified: Secondary | ICD-10-CM | POA: Insufficient documentation

## 2017-11-20 DIAGNOSIS — I340 Nonrheumatic mitral (valve) insufficiency: Secondary | ICD-10-CM | POA: Insufficient documentation

## 2017-11-20 DIAGNOSIS — J479 Bronchiectasis, uncomplicated: Secondary | ICD-10-CM

## 2017-11-20 DIAGNOSIS — I5043 Acute on chronic combined systolic (congestive) and diastolic (congestive) heart failure: Secondary | ICD-10-CM

## 2017-11-20 HISTORY — DX: Dilated cardiomyopathy: I42.0

## 2017-11-20 MED ORDER — POTASSIUM CHLORIDE ER 10 MEQ PO TBCR: 10.0000 meq | EXTENDED_RELEASE_TABLET | Freq: Every day | ORAL | 3 refills | Status: DC

## 2017-11-20 MED ORDER — FUROSEMIDE 40 MG PO TABS: 40.0000 mg | ORAL_TABLET | Freq: Every day | ORAL | 3 refills | Status: DC

## 2017-11-20 NOTE — Patient Instructions (Addendum)
Medication Instructions:  Your physician has recommended you make the following change in your medication:  CONTINUE furosemide (lasix) 40 mg daily. START potassium 10 mEq daily.   Labwork: Your physician recommends that you return for lab work in 1 week: BMP. You can return to our office to have labs drawn, no appointment needed. We are open Monday-Friday 8am-5pm. You do not need to fast before blood work.   Testing/Procedures: You had a EKG today.  Follow-Up: Your physician recommends that you schedule a follow-up appointment on Thursday July 25th, 2019.   Any Other Special Instructions Will Be Listed Below (If Applicable).     If you need a refill on your cardiac medications before your next appointment, please call your pharmacy.   Furosemide tablets What is this medicine? FUROSEMIDE (fyoor [place] se mide) is a diuretic. It helps you make more urine and to lose salt and excess water from your body. This medicine is used to treat high blood pressure, and edema or swelling from heart, kidney, or liver disease. This medicine may be used for other purposes; ask your health care provider or pharmacist if you have questions. COMMON BRAND NAME(S): Active-Medicated Specimen Kit, Delone, Diuscreen, Lasix, RX Specimen Collection Kit, Specimen Collection Kit, URINX Medicated Specimen Collection What should I tell my health care provider before I take this medicine? They need to know if you have any of these conditions: -abnormal blood electrolytes -diarrhea or vomiting -gout -heart disease -kidney disease, small amounts of urine, or difficulty passing urine -liver disease -thyroid disease -an unusual or allergic reaction to furosemide, sulfa drugs, other medicines, foods, dyes, or preservatives -pregnant or trying to get pregnant -breast-feeding How should I use this medicine? Take this medicine by mouth with a glass of water. Follow the directions on the prescription label. You may take  this medicine with or without food. If it upsets your stomach, take it with food or milk. Do not take your medicine more often than directed. Remember that you will need to pass more urine after taking this medicine. Do not take your medicine at a time of day that will cause you problems. Do not take at bedtime. Talk to your pediatrician regarding the use of this medicine in children. While this drug may be prescribed for selected conditions, precautions do apply. Overdosage: If you think you have taken too much of this medicine contact a poison control center or emergency room at once. NOTE: This medicine is only for you. Do not share this medicine with others. What if I miss a dose? If you miss a dose, take it as soon as you can. If it is almost time for your next dose, take only that dose. Do not take double or extra doses. What may interact with this medicine? -aspirin and aspirin-like medicines -certain antibiotics -chloral hydrate -cisplatin -cyclosporine -digoxin -diuretics -laxatives -lithium -medicines for blood pressure -medicines that relax muscles for surgery -methotrexate -NSAIDs, medicines for pain and inflammation like ibuprofen, naproxen, or indomethacin -phenytoin -steroid medicines like prednisone or cortisone -sucralfate -thyroid hormones This list may not describe all possible interactions. Give your health care provider a list of all the medicines, herbs, non-prescription drugs, or dietary supplements you use. Also tell them if you smoke, drink alcohol, or use illegal drugs. Some items may interact with your medicine. What should I watch for while using this medicine? Visit your doctor or health care professional for regular checks on your progress. Check your blood pressure regularly. Ask your doctor or health  care professional what your blood pressure should be, and when you should contact him or her. If you are a diabetic, check your blood sugar as directed. You may  need to be on a special diet while taking this medicine. Check with your doctor. Also, ask how many glasses of fluid you need to drink a day. You must not get dehydrated. You may get drowsy or dizzy. Do not drive, use machinery, or do anything that needs mental alertness until you know how this drug affects you. Do not stand or sit up quickly, especially if you are an older patient. This reduces the risk of dizzy or fainting spells. Alcohol can make you more drowsy and dizzy. Avoid alcoholic drinks. This medicine can make you more sensitive to the sun. Keep out of the sun. If you cannot avoid being in the sun, wear protective clothing and use sunscreen. Do not use sun lamps or tanning beds/booths. What side effects may I notice from receiving this medicine? Side effects that you should report to your doctor or health care professional as soon as possible: -blood in urine or stools -dry mouth -fever or chills -hearing loss or ringing in the ears -irregular heartbeat -muscle pain or weakness, cramps -skin rash -stomach upset, pain, or nausea -tingling or numbness in the hands or feet -unusually weak or tired -vomiting or diarrhea -yellowing of the eyes or skin Side effects that usually do not require medical attention (report to your doctor or health care professional if they continue or are bothersome): -headache -loss of appetite -unusual bleeding or bruising This list may not describe all possible side effects. Call your doctor for medical advice about side effects. You may report side effects to FDA at 1-800-FDA-1088. Where should I keep my medicine? Keep out of the reach of children. Store at room temperature between 15 and 30 degrees C (59 and 86 degrees F). Protect from light. Throw away any unused medicine after the expiration date. NOTE: This sheet is a summary. It may not cover all possible information. If you have questions about this medicine, talk to your doctor, pharmacist, or  health care provider.  2018 Elsevier/Gold Standard (2014-07-15 13:49:50)

## 2017-11-20 NOTE — Progress Notes (Signed)
3 Cardiology Consultation:    Date:  11/20/2017   ID:  Eric Herring, DOB [birth year], MRN [id number]  PCP:  Eric Pal, DO  Cardiologist:  Jenne Campus, MD   Referring MD: Eric Herring*   Chief Complaint  Patient presents with  . Decreased EF  I have swollen legs  History of Present Illness:    Eric Herring is a 83 y.o. male who is being seen today for the evaluation of congestive heart failure/cardiomyopathy at the request of Eric Herring*.  For about 2 years he is being experiencing shortness of breath however for last few weeks he noticed also swelling of lower extremities.  There is also some paroxysmal nocturnal dyspnea.  He has to sleep with his head up he also would wake up sometimes in the middle of the night with shortness of breath he was seen in height by his primary care physician who gave him furosemide with good response however he ran out of this medication about a week ago and since that time his leg started swelling again.  His ability to exercise is limited because of shortness of breath walking from front to back of [employer] will make him stop 2 or 3 times.  He used to be very active but now he cannot do much because of shortness of breath.  Denies having any any heart trouble before.  There is no chest pain tightness squeezing pressure burning chest.  No history of microinfarction.  His echocardiogram was done echocardiogram showed ejection fraction 30 to 35% with moderate mitral regurgitation there is also pulmonary hypertension.  He does not snore.  Past Medical History:  Diagnosis Date  . Allergy   . Arthropathy   . CAD (coronary artery disease)   . Cardiomyopathy ([place])   . Chronic kidney disease   . Colon polyp   . COPD (chronic obstructive pulmonary disease) ([place])   . Diabetes ([place])   . Diabetic neuropathy ([place])   . DOE (dyspnea on exertion)   . Heart murmur   . Hypercholesterolemia   . Hypertension    pulmonary    . LVH (left ventricular hypertrophy)   . Lymphoma ([place])   . Mitral regurgitation   . Prostate cancer ([place])   . Pulmonary hypertension ([place])   . Tricuspid regurgitation     Past Surgical History:  Procedure Laterality Date  . HEMORROIDECTOMY     pt does not remember year    Current Medications: Current Meds  Medication Sig  . albuterol (PROVENTIL HFA;VENTOLIN HFA) 108 (90 BASE) MCG/ACT inhaler Inhale 2 puffs into the lungs 2 (two) times daily.  Marland Kitchen albuterol (PROVENTIL) (2.5 MG/3ML) 0.083% nebulizer solution Take 2.5 mg by nebulization 2 (two) times daily.  Marland Kitchen aspirin 81 MG tablet Take 81 mg by mouth daily.  . budesonide-formoterol (SYMBICORT) 160-4.5 MCG/ACT inhaler Inhale 2 puffs into the lungs 2 (two) times daily.  . Calcium Carb-Cholecalciferol (CALCIUM-VITAMIN D) 500-200 MG-UNIT tablet Take by mouth.  . fluticasone furoate-vilanterol (BREO ELLIPTA) 100-25 MCG/INH AEPB Inhale 1 puff into the lungs daily.  . furosemide (LASIX) 40 MG tablet Take 1 tablet (40 mg total) by mouth daily.  . Multiple Vitamin (MULTIVITAMIN) tablet Take by mouth.  . potassium chloride (KLOR-CON M10) 10 MEQ tablet Take 2 tabs with every dose of Lasix.  Marland Kitchen pravastatin (PRAVACHOL) 40 MG tablet Take 40 mg by mouth daily.  Marland Kitchen umeclidinium bromide (INCRUSE ELLIPTA) 62.5 MCG/INH AEPB Inhale 1 puff into the lungs daily.  Allergies:   Lisinopril   Social History   Socioeconomic History  . Marital status: Married    Spouse name: Not on file  . Number of children: Not on file  . Years of education: Not on file  . Highest education level: Not on file  Occupational History  . Not on file  Social Needs  . Financial resource strain: Not on file  . Food insecurity:    Worry: Not on file    Inability: Not on file  . Transportation needs:    Medical: Not on file    Non-medical: Not on file  Tobacco Use  . Smoking status: Former Smoker    Packs/day: 1.00    Years: 25.00    Pack years: 25.00    Types:  Cigarettes    Last attempt to quit: 11/30/2007    Years since quitting: 9.9  . Smokeless tobacco: Never Used  Substance and Sexual Activity  . Alcohol use: No  . Drug use: No  . Sexual activity: Not on file  Lifestyle  . Physical activity:    Days per week: Not on file    Minutes per session: Not on file  . Stress: Not on file  Relationships  . Social connections:    Talks on phone: Not on file    Gets together: Not on file    Attends religious service: Not on file    Active member of club or organization: Not on file    Attends meetings of clubs or organizations: Not on file    Relationship status: Not on file  Other Topics Concern  . Not on file  Social History Narrative  . Not on file     Family History: The patient's Family history is unknown by patient. ROS:   Please see the history of present illness.    All 14 point review of systems negative except as described per history of present illness.  EKGs/Labs/Other Studies Reviewed:    The following studies were reviewed today:   - 1. Left ventricular systolic function is moderately depressed   with estimated EF at 30 to 35%. Moderate concentric left   ventricular hypertrophy is noted.   2. Moderate left atrial enlargement.   3. Moderate mitral regurgitation.   4. Mild to moderate tricuspid regurgitation.   5. Calculated pulmonary artery systolic pressure is approximately   55 mmHg. Moderate pulmonary hypertension.   6. The effective ejection fraction is lower than mentioned above   in view of the degree of mitral regurgitation noted  EKG:  EKG is  ordered today.  The ekg ordered today demonstrates   Recent Labs: 10/18/2017: ALT 32; BUN 22; Creatinine, Ser 1.83; Hemoglobin 14.2; Platelets 133.0; Potassium 4.0; Sodium 140; TSH 2.67  Recent Lipid Panel No results found for: CHOL, TRIG, HDL, CHOLHDL, VLDL, LDLCALC, LDLDIRECT  Physical Exam:    VS:  BP 120/62   Pulse 87   Ht 5\' 6"  (1.676 m)   Wt 176 lb 12.8  oz (80.2 kg)   SpO2 98%   BMI 28.54 kg/m     Wt Readings from Last 3 Encounters:  11/20/17 176 lb 12.8 oz (80.2 kg)  10/18/17 170 lb 6 oz (77.3 kg)  09/20/17 184 lb (83.5 kg)     GEN:  Well nourished, well developed in no acute distress HEENT: Normal NECK: No JVD; No carotid bruits LYMPHATICS: No lymphadenopathy CARDIAC: RRR, holosystolic murmur grade 2/6 best heard at left border of the sternum., no rubs, no  gallops RESPIRATORY:  Clear to auscultation without rales, wheezing or rhonchi  ABDOMEN: Soft, non-tender, non-distended MUSCULOSKELETAL:  No edema; No deformity.  2+ swelling lower extremities SKIN: Warm and dry NEUROLOGIC:  Alert and oriented x 3 PSYCHIATRIC:  Normal affect   ASSESSMENT:    1. Chronic respiratory failure with hypoxia (HCC)   2. Dilated cardiomyopathy ([place])   3. Pulmonary hypertension, unspecified ([place])   4. Non-rheumatic mitral regurgitation   5. Bronchiectasis without complication ([place])   6. Localized edema    PLAN:    In order of problems listed above:  1. Congestive heart failure he is New York Heart Association class III.  His ejection fraction is 30 to 35% based on echocardiogram.  He is being put on diuretics with very good response however he ran out of his medications.  I put him back on 40 mg furosemide 10 mg potassium.  I would like to see him back in my office carotid promptly within next 2 to 3 weeks so we can augment his therapy and put on medication but will improve his prognosis and hopefully improve left ventricular ejection fraction.  Because of his baseline abnormality of kidney function I doubt will be successful with putting him ACE inhibitor or ARB or Entresto.  Because of his lung problems we may have difficulty putting him on a beta-blocker however, I think vasodilatation with hydralazine and isosorbide may be very beneficial for him.  He is an African-American.  Again for now today we will put him back on his furosemide with  potassium I see him back in office 2 to 3 weeks.  I will also ask him to have Chem-7 done in about a week.  In terms of etiology of his cardiomyopathy is unclear at the moment.  EKG will be done today.  In the future he may require CAD work-up. 2. Pulmonary hypertension.  Probably multifactorial obviously will try to improve his left ventricular ejection fraction and then pulmonary artery pressure will be reassessed. 3. Mitral regurgitation which is at least moderate and probably secondary to his cardiomyopathy.  Again the key will be to improve his left ventricular ejection fraction recheck it. 4. Localized edema involving lower extremities are related to congestive heart failure.  Also pulmonary hypertension.  Will restart his diuretic.   Medication Adjustments/Labs and Tests Ordered: Current medicines are reviewed at length with the patient today.  Concerns regarding medicines are outlined above.  No orders of the defined types were placed in this encounter.  No orders of the defined types were placed in this encounter.   Signed, Park Liter, MD, [facility] 11/20/2017 4:13 PM    [facility]

## 2017-11-29 ENCOUNTER — Ambulatory Visit (INDEPENDENT_AMBULATORY_CARE_PROVIDER_SITE_OTHER): Payer: Medicare HMO | Admitting: Cardiology

## 2017-11-29 ENCOUNTER — Encounter: Payer: Self-pay | Admitting: Cardiology

## 2017-11-29 VITALS — BP 126/62 | HR 86 | Ht 66.0 in | Wt 173.1 lb

## 2017-11-29 DIAGNOSIS — J479 Bronchiectasis, uncomplicated: Secondary | ICD-10-CM

## 2017-11-29 DIAGNOSIS — I340 Nonrheumatic mitral (valve) insufficiency: Secondary | ICD-10-CM

## 2017-11-29 DIAGNOSIS — I5043 Acute on chronic combined systolic (congestive) and diastolic (congestive) heart failure: Secondary | ICD-10-CM | POA: Diagnosis not present

## 2017-11-29 DIAGNOSIS — I420 Dilated cardiomyopathy: Secondary | ICD-10-CM | POA: Diagnosis not present

## 2017-11-29 MED ORDER — HYDRALAZINE HCL 10 MG PO TABS: 10.0000 mg | ORAL_TABLET | Freq: Four times a day (QID) | ORAL | 3 refills | Status: DC

## 2017-11-29 NOTE — Progress Notes (Signed)
Cardiology Office Note:    Date:  11/29/2017   ID:  Eric Herring, DOB 01/02/1935, MRN [id number]  PCP:  Shelda Pal, DO  Cardiologist:  Jenne Campus, MD    Referring MD: Shelda Pal*   Chief Complaint  Patient presents with  . Follow-up    1 week  Doing better  History of Present Illness:    Eric Herring is a 83 y.o. male with dilated cardiomyopathy and ejection fraction close to 30%.  This is a recent discovery we just initiated therapy for it complicating issue is the fact that he does have some kidney dysfunction.  So far we used some diuresis he is feeling better swelling of lower extremities improved still complained of having some shortness of breath as well as proximal nocturnal dyspnea.  On my conversation with him I noted that he is trying to drink a lot of fluid to compensate for urine that he passed.  I told him this is an appropriate he need to stay away from too much fluid.  His wife was in the room with him as she cooks for him and she said she is very well well aware of needs to avoid salt.  Denies have any chest pain tightness squeezing pressure burning chest.  Past Medical History:  Diagnosis Date  . Allergy   . Arthropathy   . CAD (coronary artery disease)   . Cardiomyopathy ([place])   . Chronic kidney disease   . Colon polyp   . COPD (chronic obstructive pulmonary disease) ([place])   . Diabetes ([place])   . Diabetic neuropathy ([place])   . DOE (dyspnea on exertion)   . Heart murmur   . Hypercholesterolemia   . Hypertension    pulmonary  . LVH (left ventricular hypertrophy)   . Lymphoma ([place])   . Mitral regurgitation   . Prostate cancer ([place])   . Pulmonary hypertension ([place])   . Tricuspid regurgitation     Past Surgical History:  Procedure Laterality Date  . HEMORROIDECTOMY     pt does not remember year    Current Medications: Current Meds  Medication Sig  . albuterol (PROVENTIL HFA;VENTOLIN HFA) 108 (90 BASE) MCG/ACT  inhaler Inhale 2 puffs into the lungs 2 (two) times daily.  Marland Kitchen albuterol (PROVENTIL) (2.5 MG/3ML) 0.083% nebulizer solution Take 2.5 mg by nebulization 2 (two) times daily.  Marland Kitchen aspirin 81 MG tablet Take 81 mg by mouth daily.  . budesonide-formoterol (SYMBICORT) 160-4.5 MCG/ACT inhaler Inhale 2 puffs into the lungs 2 (two) times daily.  . Calcium Carb-Cholecalciferol (CALCIUM-VITAMIN D) 500-200 MG-UNIT tablet Take by mouth.  . fluticasone furoate-vilanterol (BREO ELLIPTA) 100-25 MCG/INH AEPB Inhale 1 puff into the lungs daily.  . furosemide (LASIX) 40 MG tablet Take 1 tablet (40 mg total) by mouth daily.  . Multiple Vitamin (MULTIVITAMIN) tablet Take by mouth.  . potassium chloride (K-DUR) 10 MEQ tablet Take 1 tablet (10 mEq total) by mouth daily.  . pravastatin (PRAVACHOL) 40 MG tablet Take 40 mg by mouth daily.  Marland Kitchen umeclidinium bromide (INCRUSE ELLIPTA) 62.5 MCG/INH AEPB Inhale 1 puff into the lungs daily.     Allergies:   Lisinopril   Social History   Socioeconomic History  . Marital status: Married    Spouse name: Not on file  . Number of children: Not on file  . Years of education: Not on file  . Highest education level: Not on file  Occupational History  . Not on file  Social Needs  .  Financial resource strain: Not on file  . Food insecurity:    Worry: Not on file    Inability: Not on file  . Transportation needs:    Medical: Not on file    Non-medical: Not on file  Tobacco Use  . Smoking status: Former Smoker    Packs/day: 1.00    Years: 25.00    Pack years: 25.00    Types: Cigarettes    Last attempt to quit: 11/30/2007    Years since quitting: 10.0  . Smokeless tobacco: Never Used  Substance and Sexual Activity  . Alcohol use: No  . Drug use: No  . Sexual activity: Not on file  Lifestyle  . Physical activity:    Days per week: Not on file    Minutes per session: Not on file  . Stress: Not on file  Relationships  . Social connections:    Talks on phone: Not on  file    Gets together: Not on file    Attends religious service: Not on file    Active member of club or organization: Not on file    Attends meetings of clubs or organizations: Not on file    Relationship status: Not on file  Other Topics Concern  . Not on file  Social History Narrative  . Not on file     Family History: The patient's Family history is unknown by patient. ROS:   Please see the history of present illness.    All 14 point review of systems negative except as described per history of present illness  EKGs/Labs/Other Studies Reviewed:      Recent Labs: 10/18/2017: ALT 32; BUN 22; Creatinine, Ser 1.83; Hemoglobin 14.2; Platelets 133.0; Potassium 4.0; Sodium 140; TSH 2.67  Recent Lipid Panel No results found for: CHOL, TRIG, HDL, CHOLHDL, VLDL, LDLCALC, LDLDIRECT  Physical Exam:    VS:  BP 126/62   Pulse 86   Ht 5\' 6"  (1.676 m)   Wt 173 lb 1.9 oz (78.5 kg)   SpO2 95%   BMI 27.94 kg/m     Wt Readings from Last 3 Encounters:  11/29/17 173 lb 1.9 oz (78.5 kg)  11/20/17 176 lb 12.8 oz (80.2 kg)  10/18/17 170 lb 6 oz (77.3 kg)     GEN:  Well nourished, well developed in no acute distress HEENT: Normal NECK: No JVD; No carotid bruits LYMPHATICS: No lymphadenopathy CARDIAC: RRR, no murmurs, no rubs, no gallops RESPIRATORY:  Clear to auscultation without rales, wheezing or rhonchi  ABDOMEN: Soft, non-tender, non-distended MUSCULOSKELETAL:  No edema; No deformity  SKIN: Warm and dry LOWER EXTREMITIES: no swelling NEUROLOGIC:  Alert and oriented x 3 PSYCHIATRIC:  Normal affect   ASSESSMENT:    1. Acute on chronic combined systolic and diastolic congestive heart failure ([place])   2. Dilated cardiomyopathy ([place])   3. Non-rheumatic mitral regurgitation   4. Bronchiectasis without complication ([place])    PLAN:    In order of problems listed above:  1. Acute on chronic congestive heart failure New York Heart Association class III.  He is only on diuretics.   I will check his Chem-7 today as well as proBNP.  I will ask him to start hydralazine 10 mg 4 times a day.  I see him back in my office in about 3 to 4 weeks and at that time hopefully will be able to add long-acting nitrates.  Again the biggest obstacle that he will be facing most likely will be low blood pressure  as well as kidney dysfunction. 2. Nonrheumatic mitral regurgitation should improve with improvement left ventricular ejection fraction. 3. Bronchiectasis that being followed by internal medicine team.   Medication Adjustments/Labs and Tests Ordered: Current medicines are reviewed at length with the patient today.  Concerns regarding medicines are outlined above.  No orders of the defined types were placed in this encounter.  Medication changes: No orders of the defined types were placed in this encounter.   Signed, Park Liter, MD, [facility] 11/29/2017 11:26 AM    [place]

## 2017-11-29 NOTE — Patient Instructions (Signed)
Medication Instructions:  Your physician has recommended you make the following change in your medication:   START: Hydralazine 10 mg 4 times daily.   Labwork: Your physician recommends that you return for lab work today: BMP, and Pro BNP.  Testing/Procedures: None.  Follow-Up: Your physician recommends that you schedule a follow-up appointment in: 1 month.   Any Other Special Instructions Will Be Listed Below (If Applicable).  Hydralazine tablets What is this medicine? HYDRALAZINE (hye DRAL a zeen) is a type of vasodilator. It relaxes blood vessels, increasing the blood and oxygen supply to your heart. This medicine is used to treat high blood pressure. This medicine may be used for other purposes; ask your health care provider or pharmacist if you have questions. COMMON BRAND NAME(S): Apresoline What should I tell my health care provider before I take this medicine? They need to know if you have any of these conditions: -blood vessel disease -heart disease including angina or history of heart attack -kidney or liver disease -systemic lupus erythematosus (SLE) -an unusual or allergic reaction to hydralazine, tartrazine dye, other medicines, foods, dyes, or preservatives -pregnant or trying to get pregnant -breast-feeding How should I use this medicine? Take this medicine by mouth with a glass of water. Follow the directions on the prescription label. Take your doses at regular intervals. Do not take your medicine more often than directed. Do not stop taking except on the advice of your doctor or health care professional. Talk to your pediatrician regarding the use of this medicine in children. Special care may be needed. While this drug may be prescribed for children for selected conditions, precautions do apply. Overdosage: If you think you have taken too much of this medicine contact a poison control center or emergency room at once. NOTE: This medicine is only for you. Do not  share this medicine with others. What if I miss a dose? If you miss a dose, take it as soon as you can. If it is almost time for your next dose, take only that dose. Do not take double or extra doses. What may interact with this medicine? -medicines for high blood pressure -medicines for mental depression This list may not describe all possible interactions. Give your health care provider a list of all the medicines, herbs, non-prescription drugs, or dietary supplements you use. Also tell them if you smoke, drink alcohol, or use illegal drugs. Some items may interact with your medicine. What should I watch for while using this medicine? Visit your doctor or health care professional for regular checks on your progress. Check your blood pressure and pulse rate regularly. Ask your doctor or health care professional what your blood pressure and pulse rate should be and when you should contact him or her. You may get drowsy or dizzy. Do not drive, use machinery, or do anything that needs mental alertness until you know how this medicine affects you. Do not stand or sit up quickly, especially if you are an older patient. This reduces the risk of dizzy or fainting spells. Alcohol may interfere with the effect of this medicine. Avoid alcoholic drinks. Do not treat yourself for coughs, colds, or pain while you are taking this medicine without asking your doctor or health care professional for advice. Some ingredients may increase your blood pressure. What side effects may I notice from receiving this medicine? Side effects that you should report to your doctor or health care professional as soon as possible: -chest pain, or fast or irregular heartbeat -fever,  chills, or sore throat -numbness or tingling in the hands or feet -shortness of breath -skin rash, redness, blisters or itching -stiff or swollen joints -sudden weight gain -swelling of the feet or legs -swollen lymph glands -unusual weakness Side  effects that usually do not require medical attention (report to your doctor or health care professional if they continue or are bothersome): -diarrhea, or constipation -headache -loss of appetite -nausea, vomiting This list may not describe all possible side effects. Call your doctor for medical advice about side effects. You may report side effects to FDA at 1-800-FDA-1088. Where should I keep my medicine? Keep out of the reach of children. Store at room temperature between 15 and 30 degrees C (59 and 86 degrees F). Throw away any unused medicine after the expiration date. NOTE: This sheet is a summary. It may not cover all possible information. If you have questions about this medicine, talk to your doctor, pharmacist, or health care provider.  2018 Elsevier/Gold Standard (2007-09-06 15:44:58)      If you need a refill on your cardiac medications before your next appointment, please call your pharmacy.

## 2017-11-30 ENCOUNTER — Telehealth: Payer: Self-pay | Admitting: Emergency Medicine

## 2017-11-30 DIAGNOSIS — I5043 Acute on chronic combined systolic (congestive) and diastolic (congestive) heart failure: Secondary | ICD-10-CM

## 2017-11-30 LAB — BASIC METABOLIC PANEL
BUN/Creatinine Ratio: 14 (ref 10–24)
BUN: 29 mg/dL — ABNORMAL HIGH (ref 8–27)
CO2: 24 mmol/L (ref 20–29)
Calcium: 9.7 mg/dL (ref 8.6–10.2)
Chloride: 104 mmol/L (ref 96–106)
Creatinine, Ser: 2.14 mg/dL — ABNORMAL HIGH (ref 0.76–1.27)
GFR calc Af Amer: 32 mL/min/{1.73_m2} — ABNORMAL LOW (ref >59)
GFR calc non Af Amer: 28 mL/min/{1.73_m2} — ABNORMAL LOW (ref >59)
Glucose: 70 mg/dL (ref 65–99)
Potassium: 4.6 mmol/L (ref 3.5–5.2)
Sodium: 142 mmol/L (ref 134–144)

## 2017-11-30 LAB — PRO B NATRIURETIC PEPTIDE: NT-Pro BNP: 4831 pg/mL — ABNORMAL HIGH (ref 0–486)

## 2017-11-30 NOTE — Telephone Encounter (Signed)
Spoke with wife Hattie per dpr about lab results. Patient's wife informaed that patient needs to return for lab work in one week. Patient's wife verbally understands.

## 2017-12-03 ENCOUNTER — Other Ambulatory Visit: Payer: Self-pay | Admitting: Family Medicine

## 2017-12-03 MED ORDER — ALBUTEROL SULFATE (2.5 MG/3ML) 0.083% IN NEBU: 2.5000 mg | INHALATION_SOLUTION | Freq: Two times a day (BID) | RESPIRATORY_TRACT | 0 refills | Status: DC

## 2017-12-10 ENCOUNTER — Ambulatory Visit: Payer: Medicare HMO | Admitting: Family Medicine

## 2017-12-10 ENCOUNTER — Ambulatory Visit: Payer: Medicare HMO

## 2017-12-10 ENCOUNTER — Inpatient Hospital Stay (HOSPITAL_COMMUNITY): Payer: Medicare HMO

## 2017-12-10 ENCOUNTER — Emergency Department (HOSPITAL_BASED_OUTPATIENT_CLINIC_OR_DEPARTMENT_OTHER): Payer: Medicare HMO

## 2017-12-10 ENCOUNTER — Inpatient Hospital Stay (HOSPITAL_BASED_OUTPATIENT_CLINIC_OR_DEPARTMENT_OTHER)
Admission: EM | Admit: 2017-12-10 | Discharge: 2017-12-14 | DRG: 193 | Disposition: A | Discharge status: Skilled Nursing Facility | Payer: Medicare HMO | Attending: Family Medicine | Admitting: Family Medicine

## 2017-12-10 ENCOUNTER — Encounter (HOSPITAL_BASED_OUTPATIENT_CLINIC_OR_DEPARTMENT_OTHER): Payer: Self-pay | Admitting: Emergency Medicine

## 2017-12-10 ENCOUNTER — Other Ambulatory Visit: Payer: Self-pay

## 2017-12-10 DIAGNOSIS — I5043 Acute on chronic combined systolic (congestive) and diastolic (congestive) heart failure: Secondary | ICD-10-CM | POA: Diagnosis present

## 2017-12-10 DIAGNOSIS — J189 Pneumonia, unspecified organism: Secondary | ICD-10-CM | POA: Diagnosis not present

## 2017-12-10 DIAGNOSIS — I509 Heart failure, unspecified: Secondary | ICD-10-CM | POA: Diagnosis not present

## 2017-12-10 DIAGNOSIS — Z87891 Personal history of nicotine dependence: Secondary | ICD-10-CM

## 2017-12-10 DIAGNOSIS — I272 Pulmonary hypertension, unspecified: Secondary | ICD-10-CM | POA: Diagnosis present

## 2017-12-10 DIAGNOSIS — I480 Paroxysmal atrial fibrillation: Secondary | ICD-10-CM | POA: Diagnosis not present

## 2017-12-10 DIAGNOSIS — I248 Other forms of acute ischemic heart disease: Secondary | ICD-10-CM | POA: Diagnosis present

## 2017-12-10 DIAGNOSIS — Z8572 Personal history of non-Hodgkin lymphomas: Secondary | ICD-10-CM | POA: Diagnosis not present

## 2017-12-10 DIAGNOSIS — N179 Acute kidney failure, unspecified: Secondary | ICD-10-CM | POA: Diagnosis present

## 2017-12-10 DIAGNOSIS — E1140 Type 2 diabetes mellitus with diabetic neuropathy, unspecified: Secondary | ICD-10-CM | POA: Diagnosis present

## 2017-12-10 DIAGNOSIS — Z888 Allergy status to other drugs, medicaments and biological substances status: Secondary | ICD-10-CM

## 2017-12-10 DIAGNOSIS — I2510 Atherosclerotic heart disease of native coronary artery without angina pectoris: Secondary | ICD-10-CM | POA: Diagnosis present

## 2017-12-10 DIAGNOSIS — Z8546 Personal history of malignant neoplasm of prostate: Secondary | ICD-10-CM | POA: Diagnosis not present

## 2017-12-10 DIAGNOSIS — R000 Tachycardia, unspecified: Secondary | ICD-10-CM | POA: Diagnosis present

## 2017-12-10 DIAGNOSIS — I4510 Unspecified right bundle-branch block: Secondary | ICD-10-CM | POA: Diagnosis present

## 2017-12-10 DIAGNOSIS — J432 Centrilobular emphysema: Secondary | ICD-10-CM | POA: Diagnosis not present

## 2017-12-10 DIAGNOSIS — J440 Chronic obstructive pulmonary disease with acute lower respiratory infection: Secondary | ICD-10-CM | POA: Diagnosis present

## 2017-12-10 DIAGNOSIS — R0600 Dyspnea, unspecified: Secondary | ICD-10-CM

## 2017-12-10 DIAGNOSIS — N184 Chronic kidney disease, stage 4 (severe): Secondary | ICD-10-CM | POA: Diagnosis present

## 2017-12-10 DIAGNOSIS — Z79899 Other long term (current) drug therapy: Secondary | ICD-10-CM

## 2017-12-10 DIAGNOSIS — R17 Unspecified jaundice: Secondary | ICD-10-CM | POA: Diagnosis present

## 2017-12-10 DIAGNOSIS — Z86010 Personal history of colonic polyps: Secondary | ICD-10-CM

## 2017-12-10 DIAGNOSIS — R740 Nonspecific elevation of levels of transaminase and lactic acid dehydrogenase [LDH]: Secondary | ICD-10-CM | POA: Diagnosis present

## 2017-12-10 DIAGNOSIS — E785 Hyperlipidemia, unspecified: Secondary | ICD-10-CM | POA: Diagnosis present

## 2017-12-10 DIAGNOSIS — I4581 Long QT syndrome: Secondary | ICD-10-CM | POA: Diagnosis present

## 2017-12-10 DIAGNOSIS — R011 Cardiac murmur, unspecified: Secondary | ICD-10-CM | POA: Diagnosis present

## 2017-12-10 DIAGNOSIS — Z7982 Long term (current) use of aspirin: Secondary | ICD-10-CM

## 2017-12-10 DIAGNOSIS — N170 Acute kidney failure with tubular necrosis: Secondary | ICD-10-CM | POA: Diagnosis not present

## 2017-12-10 DIAGNOSIS — I130 Hypertensive heart and chronic kidney disease with heart failure and stage 1 through stage 4 chronic kidney disease, or unspecified chronic kidney disease: Secondary | ICD-10-CM | POA: Diagnosis present

## 2017-12-10 DIAGNOSIS — I50811 Acute right heart failure: Secondary | ICD-10-CM | POA: Diagnosis not present

## 2017-12-10 DIAGNOSIS — E1149 Type 2 diabetes mellitus with other diabetic neurological complication: Secondary | ICD-10-CM | POA: Diagnosis present

## 2017-12-10 DIAGNOSIS — J449 Chronic obstructive pulmonary disease, unspecified: Secondary | ICD-10-CM | POA: Diagnosis not present

## 2017-12-10 DIAGNOSIS — J479 Bronchiectasis, uncomplicated: Secondary | ICD-10-CM

## 2017-12-10 DIAGNOSIS — I420 Dilated cardiomyopathy: Secondary | ICD-10-CM | POA: Diagnosis present

## 2017-12-10 DIAGNOSIS — R0902 Hypoxemia: Secondary | ICD-10-CM

## 2017-12-10 DIAGNOSIS — J9621 Acute and chronic respiratory failure with hypoxia: Secondary | ICD-10-CM | POA: Diagnosis present

## 2017-12-10 DIAGNOSIS — R0689 Other abnormalities of breathing: Principal | ICD-10-CM | POA: Insufficient documentation

## 2017-12-10 DIAGNOSIS — J181 Lobar pneumonia, unspecified organism: Principal | ICD-10-CM | POA: Diagnosis present

## 2017-12-10 DIAGNOSIS — E1122 Type 2 diabetes mellitus with diabetic chronic kidney disease: Secondary | ICD-10-CM | POA: Diagnosis present

## 2017-12-10 DIAGNOSIS — I081 Rheumatic disorders of both mitral and tricuspid valves: Secondary | ICD-10-CM | POA: Diagnosis present

## 2017-12-10 DIAGNOSIS — E7800 Pure hypercholesterolemia, unspecified: Secondary | ICD-10-CM | POA: Diagnosis present

## 2017-12-10 DIAGNOSIS — Z66 Do not resuscitate: Secondary | ICD-10-CM | POA: Diagnosis present

## 2017-12-10 LAB — CBC WITH DIFFERENTIAL/PLATELET
BASOS PCT: 0 %
Basophils Absolute: 0 10*3/uL (ref 0.0–0.1)
EOS ABS: 0 10*3/uL (ref 0.0–0.7)
Eosinophils Relative: 0 %
HEMATOCRIT: 38.2 % — AB (ref 39.0–52.0)
HEMOGLOBIN: 14.6 g/dL (ref 13.0–17.0)
Lymphocytes Relative: 4 %
Lymphs Abs: 0.3 10*3/uL (ref 0.7–4.0)
MCH: 28.2 pg (ref 26.0–34.0)
MCHC: 38.2 g/dL — AB (ref 30.0–36.0)
MCV: 73.9 fL — ABNORMAL LOW (ref 78.0–100.0)
MONOS PCT: 8 %
Monocytes Absolute: 0.7 10*3/uL (ref 0.1–1.0)
NEUTROS ABS: 7.7 10*3/uL (ref 1.7–7.7)
NEUTROS PCT: 88 %
Platelets: 156 10*3/uL (ref 150–400)
RBC: 5.17 MIL/uL (ref 4.22–5.81)
RDW: 15.1 % (ref 11.5–15.5)
WBC: 8.8 10*3/uL (ref 4.0–10.5)

## 2017-12-10 LAB — TROPONIN I
Troponin I: 0.68 ng/mL (ref <0.03)
Troponin I: 0.83 ng/mL (ref <0.03)

## 2017-12-10 LAB — I-STAT VENOUS BLOOD GAS, ED
Acid-Base Excess: 1 mmol/L (ref 0.0–2.0)
Bicarbonate: 28 mmol/L (ref 20.0–28.0)
O2 Saturation: 67 %
PCO2 VEN: 53.8 mmHg (ref 44.0–60.0)
PO2 VEN: 38 mmHg (ref 32.0–45.0)
TCO2: 30 mmol/L (ref 22–32)
pH, Ven: 7.324 (ref 7.250–7.430)

## 2017-12-10 LAB — COMPREHENSIVE METABOLIC PANEL
ALBUMIN: 3.8 g/dL (ref 3.5–5.0)
ALT: 327 U/L — ABNORMAL HIGH (ref 0–44)
AST: 399 U/L — AB (ref 15–41)
Alkaline Phosphatase: 168 U/L — ABNORMAL HIGH (ref 38–126)
Anion gap: 14 (ref 5–15)
BILIRUBIN TOTAL: 2.8 mg/dL — AB (ref 0.3–1.2)
BUN: 75 mg/dL — AB (ref 8–23)
CHLORIDE: 99 mmol/L (ref 98–111)
CO2: 26 mmol/L (ref 22–32)
Calcium: 9.9 mg/dL (ref 8.9–10.3)
Creatinine, Ser: 2.84 mg/dL — ABNORMAL HIGH (ref 0.61–1.24)
GFR calc Af Amer: 22 mL/min — ABNORMAL LOW (ref >60)
GFR calc non Af Amer: 19 mL/min — ABNORMAL LOW (ref >60)
GLUCOSE: 176 mg/dL — AB (ref 70–99)
POTASSIUM: 4.9 mmol/L (ref 3.5–5.1)
SODIUM: 139 mmol/L (ref 135–145)
Total Protein: 8.6 g/dL — ABNORMAL HIGH (ref 6.5–8.1)

## 2017-12-10 LAB — GLUCOSE, CAPILLARY: GLUCOSE-CAPILLARY: 162 mg/dL — AB (ref 70–99)

## 2017-12-10 LAB — RESPIRATORY PANEL BY PCR
Adenovirus: NOT DETECTED
BORDETELLA PERTUSSIS-RVPCR: NOT DETECTED
CORONAVIRUS 229E-RVPPCR: NOT DETECTED
CORONAVIRUS OC43-RVPPCR: NOT DETECTED
Chlamydophila pneumoniae: NOT DETECTED
Coronavirus HKU1: NOT DETECTED
Coronavirus NL63: NOT DETECTED
INFLUENZA B-RVPPCR: NOT DETECTED
Influenza A: NOT DETECTED
MYCOPLASMA PNEUMONIAE-RVPPCR: NOT DETECTED
Metapneumovirus: NOT DETECTED
PARAINFLUENZA VIRUS 1-RVPPCR: NOT DETECTED
Parainfluenza Virus 2: NOT DETECTED
Parainfluenza Virus 3: NOT DETECTED
Parainfluenza Virus 4: NOT DETECTED
RESPIRATORY SYNCYTIAL VIRUS-RVPPCR: NOT DETECTED
Rhinovirus / Enterovirus: NOT DETECTED

## 2017-12-10 LAB — HEMOGLOBIN A1C
Hgb A1c MFr Bld: 6.1 % — ABNORMAL HIGH (ref 4.8–5.6)
MEAN PLASMA GLUCOSE: 128.37 mg/dL

## 2017-12-10 LAB — BRAIN NATRIURETIC PEPTIDE: B Natriuretic Peptide: 1224.9 pg/mL — ABNORMAL HIGH (ref 0.0–100.0)

## 2017-12-10 LAB — MRSA PCR SCREENING: MRSA by PCR: NEGATIVE

## 2017-12-10 LAB — D-DIMER, QUANTITATIVE: D-Dimer, Quant: 1.86 ug/mL-FEU — ABNORMAL HIGH (ref 0.00–0.50)

## 2017-12-10 MED ORDER — METOPROLOL TARTRATE 5 MG/5ML IV SOLN
2.5000 mg | Freq: Once | INTRAVENOUS | Status: AC
  Administered 2017-12-10: 2.5 mg via INTRAVENOUS

## 2017-12-10 MED ORDER — SODIUM CHLORIDE 0.9 % IV SOLN
INTRAVENOUS | Status: DC | PRN
  Administered 2017-12-10: 250 mL via INTRAVENOUS
  Administered 2017-12-10: 1000 mL via INTRAVENOUS
  Administered 2017-12-13: 250 mL via INTRAVENOUS

## 2017-12-10 MED ORDER — HEPARIN SODIUM (PORCINE) 5000 UNIT/ML IJ SOLN
5000.0000 [IU] | Freq: Three times a day (TID) | INTRAMUSCULAR | Status: DC
  Administered 2017-12-10 – 2017-12-11 (×3): 5000 [IU] via SUBCUTANEOUS
  Filled 2017-12-10 (×3): qty 1

## 2017-12-10 MED ORDER — IPRATROPIUM-ALBUTEROL 0.5-2.5 (3) MG/3ML IN SOLN
3.0000 mL | Freq: Four times a day (QID) | RESPIRATORY_TRACT | Status: DC
  Administered 2017-12-10 – 2017-12-14 (×15): 3 mL via RESPIRATORY_TRACT
  Filled 2017-12-10 (×16): qty 3

## 2017-12-10 MED ORDER — FLUTICASONE FUROATE-VILANTEROL 100-25 MCG/INH IN AEPB
1.0000 | INHALATION_SPRAY | Freq: Every day | RESPIRATORY_TRACT | Status: DC
  Administered 2017-12-11 – 2017-12-14 (×3): 1 via RESPIRATORY_TRACT
  Filled 2017-12-10: qty 28

## 2017-12-10 MED ORDER — DILTIAZEM HCL-DEXTROSE 100-5 MG/100ML-% IV SOLN (PREMIX)
5.0000 mg/h | INTRAVENOUS | Status: DC
  Administered 2017-12-10: 5 mg/h via INTRAVENOUS
  Administered 2017-12-11 (×2): 10 mg/h via INTRAVENOUS
  Filled 2017-12-10 (×3): qty 100

## 2017-12-10 MED ORDER — ASPIRIN 81 MG PO CHEW
81.0000 mg | CHEWABLE_TABLET | Freq: Every day | ORAL | Status: DC
  Administered 2017-12-10 – 2017-12-13 (×4): 81 mg via ORAL
  Filled 2017-12-10 (×4): qty 1

## 2017-12-10 MED ORDER — INSULIN ASPART 100 UNIT/ML ~~LOC~~ SOLN: 0.0000 [IU] | Freq: Three times a day (TID) | SUBCUTANEOUS | Status: DC

## 2017-12-10 MED ORDER — IPRATROPIUM-ALBUTEROL 0.5-2.5 (3) MG/3ML IN SOLN
3.0000 mL | Freq: Once | RESPIRATORY_TRACT | Status: AC
  Administered 2017-12-10: 3 mL via RESPIRATORY_TRACT
  Filled 2017-12-10: qty 3

## 2017-12-10 MED ORDER — MAGNESIUM SULFATE 2 GM/50ML IV SOLN
2.0000 g | Freq: Once | INTRAVENOUS | Status: AC
  Administered 2017-12-10: 2 g via INTRAVENOUS
  Filled 2017-12-10: qty 50

## 2017-12-10 MED ORDER — PRAVASTATIN SODIUM 40 MG PO TABS
40.0000 mg | ORAL_TABLET | Freq: Every day | ORAL | Status: DC
  Administered 2017-12-10 – 2017-12-11 (×2): 40 mg via ORAL
  Filled 2017-12-10 (×2): qty 1

## 2017-12-10 MED ORDER — ALBUTEROL SULFATE (2.5 MG/3ML) 0.083% IN NEBU
5.0000 mg | INHALATION_SOLUTION | Freq: Once | RESPIRATORY_TRACT | Status: AC
  Administered 2017-12-10: 5 mg via RESPIRATORY_TRACT
  Filled 2017-12-10: qty 6

## 2017-12-10 MED ORDER — SODIUM CHLORIDE 0.9 % IV SOLN: 500.0000 mg | Freq: Once | INTRAVENOUS | Status: DC

## 2017-12-10 MED ORDER — FUROSEMIDE 10 MG/ML IJ SOLN
40.0000 mg | Freq: Once | INTRAMUSCULAR | Status: AC
  Administered 2017-12-10: 40 mg via INTRAVENOUS
  Filled 2017-12-10: qty 4

## 2017-12-10 MED ORDER — METOPROLOL TARTRATE 5 MG/5ML IV SOLN
INTRAVENOUS | Status: AC
  Administered 2017-12-10: 2.5 mg via INTRAVENOUS
  Filled 2017-12-10: qty 5

## 2017-12-10 MED ORDER — SODIUM CHLORIDE 0.9 % IV SOLN: 1.0000 g | Freq: Once | INTRAVENOUS | Status: DC

## 2017-12-10 MED ORDER — AZITHROMYCIN 500 MG IV SOLR
500.0000 mg | Freq: Once | INTRAVENOUS | Status: AC
  Administered 2017-12-10: 500 mg via INTRAVENOUS
  Filled 2017-12-10: qty 500

## 2017-12-10 MED ORDER — SODIUM CHLORIDE 0.9 % IV SOLN
1.0000 g | Freq: Once | INTRAVENOUS | Status: AC
  Administered 2017-12-10: 1 g via INTRAVENOUS
  Filled 2017-12-10: qty 10

## 2017-12-10 MED ORDER — IPRATROPIUM-ALBUTEROL 0.5-2.5 (3) MG/3ML IN SOLN
3.0000 mL | RESPIRATORY_TRACT | Status: DC
  Administered 2017-12-10: 3 mL via RESPIRATORY_TRACT
  Filled 2017-12-10: qty 3

## 2017-12-10 MED ORDER — SODIUM CHLORIDE 0.9 % IV SOLN
1.0000 g | INTRAVENOUS | Status: AC
  Administered 2017-12-11 – 2017-12-14 (×4): 1 g via INTRAVENOUS
  Filled 2017-12-10 (×2): qty 10
  Filled 2017-12-10: qty 1
  Filled 2017-12-10: qty 10

## 2017-12-10 MED ORDER — METHYLPREDNISOLONE SODIUM SUCC 125 MG IJ SOLR
125.0000 mg | Freq: Once | INTRAMUSCULAR | Status: AC
Start: 2017-12-10 — End: 2017-12-10
  Administered 2017-12-10: 125 mg via INTRAVENOUS
  Filled 2017-12-10: qty 2

## 2017-12-10 MED ORDER — SODIUM CHLORIDE 3 % IN NEBU
4.0000 mL | INHALATION_SOLUTION | Freq: Three times a day (TID) | RESPIRATORY_TRACT | Status: AC
  Administered 2017-12-10 – 2017-12-13 (×9): 4 mL via RESPIRATORY_TRACT
  Filled 2017-12-10 (×9): qty 4

## 2017-12-10 MED ORDER — AZITHROMYCIN 500 MG IV SOLR
500.0000 mg | INTRAVENOUS | Status: DC
  Administered 2017-12-11 – 2017-12-13 (×3): 500 mg via INTRAVENOUS
  Filled 2017-12-10 (×4): qty 500

## 2017-12-10 MED ORDER — AZITHROMYCIN 500 MG IV SOLR
INTRAVENOUS | Status: AC
  Filled 2017-12-10: qty 500

## 2017-12-10 MED ORDER — ALBUTEROL SULFATE (2.5 MG/3ML) 0.083% IN NEBU
2.5000 mg | INHALATION_SOLUTION | Freq: Once | RESPIRATORY_TRACT | Status: AC
  Administered 2017-12-10: 2.5 mg via RESPIRATORY_TRACT
  Filled 2017-12-10: qty 3

## 2017-12-10 MED ORDER — IPRATROPIUM BROMIDE 0.02 % IN SOLN
0.5000 mg | Freq: Once | RESPIRATORY_TRACT | Status: AC
  Administered 2017-12-10: 0.5 mg via RESPIRATORY_TRACT
  Filled 2017-12-10: qty 2.5

## 2017-12-10 MED ORDER — HYDRALAZINE HCL 10 MG PO TABS
10.0000 mg | ORAL_TABLET | Freq: Four times a day (QID) | ORAL | Status: DC
  Administered 2017-12-10 – 2017-12-13 (×6): 10 mg via ORAL
  Filled 2017-12-10 (×6): qty 1

## 2017-12-10 NOTE — Progress Notes (Signed)
Walk in pt of Dr. [name]. Pt. C/o SOB, productive cough with yellow/black phlegm X 3 days. O2 sat 86% on RA, 91% on 2L. Lungs diminished on R side, labored breathing. No PCPs able to see pt. today. Considering cardiac hx, author consulted with Dr. Etter Sjogren, and brought pt. And accompanying wife and son to ED for triage. Pt. Arrived at 1030. Dr. [name] to be made aware.

## 2017-12-10 NOTE — ED Triage Notes (Addendum)
Was being eval by PMP in building for SOB and O2 sat's were 86% on RA. Pt states has been SOB since Sat and has been working outside and thinks the polllen has made him SOB, productive yellowish-black in color. Denies chest pain or fever

## 2017-12-10 NOTE — ED Provider Notes (Signed)
[facility] EMERGENCY DEPARTMENT Provider Note   CSN: [id number] Arrival date & time: 12/10/17  1017     History   Chief Complaint Chief Complaint  Patient presents with  . Shortness of Breath    HPI Eric Herring is a 83 y.o. male.  Patient is an 83 year old male with significant past medical history for coronary artery disease, cardiomyopathy, mitral regurgitation, pulmonary hypertension, diabetes, COPD and chronic kidney disease who is presenting today with 3 days of worsening shortness of breath, productive cough of yellow sputum and intermittent wheezing.  Patient initially went to see his doctor but was found to be 86% on room air and was sent down here for further evaluation.  Patient states in the past he has had to be on oxygen but has not used oxygen in the last year.  He has been using inhalers at home which did help some.  He states he has been out working in the yard and been around the pollens which he thinks is what is causing his symptoms.  He states the swelling in his legs has improved he has not had any weight gain and he denies any chest pain.  He has not had fever.  He has been taking all of his medications as prescribed.  The history is provided by the patient.    Past Medical History:  Diagnosis Date  . Allergy   . Arthropathy   . CAD (coronary artery disease)   . Cardiomyopathy ([place])   . Chronic kidney disease   . Colon polyp   . COPD (chronic obstructive pulmonary disease) ([place])   . Diabetes ([place])   . Diabetic neuropathy ([place])   . DOE (dyspnea on exertion)   . Heart murmur   . Hypercholesterolemia   . Hypertension    pulmonary  . LVH (left ventricular hypertrophy)   . Lymphoma ([place])   . Mitral regurgitation   . Prostate cancer ([place])   . Pulmonary hypertension ([place])   . Tricuspid regurgitation     Patient Active Problem List   Diagnosis Date Noted  . Dyspnea and respiratory abnormalities 12/10/2017  . Dilated cardiomyopathy ([place])  11/20/2017  . Pulmonary hypertension, unspecified ([place]) 11/20/2017  . Mitral regurgitation 11/20/2017  . Congestive heart failure ([place]) 11/20/2017  . Arthritis pain, hand 09/07/2017  . Type 2 diabetes mellitus with neurological complications ([place]) 28/41/3244  . Chronic respiratory failure ([place]) 06/22/2016  . Edema 10/08/2014  . COPD (chronic obstructive pulmonary disease) ([place]) 07/30/2012  . Bronchiectasis ([place]) 07/30/2012    Past Surgical History:  Procedure Laterality Date  . HEMORROIDECTOMY     pt does not remember year        Home Medications    Prior to Admission medications   Medication Sig Start Date End Date Taking? Authorizing Provider  albuterol (PROVENTIL HFA;VENTOLIN HFA) 108 (90 BASE) MCG/ACT inhaler Inhale 2 puffs into the lungs 2 (two) times daily.    [provider]  albuterol (PROVENTIL) (2.5 MG/3ML) 0.083% nebulizer solution Take 3 mLs (2.5 mg total) by nebulization 2 (two) times daily. 12/03/17   Shelda Pal, DO  aspirin 81 MG tablet Take 81 mg by mouth daily.    [provider]  budesonide-formoterol (SYMBICORT) 160-4.5 MCG/ACT inhaler Inhale 2 puffs into the lungs 2 (two) times daily. 09/18/17   Shelda Pal, DO  Calcium Carb-Cholecalciferol (CALCIUM-VITAMIN D) 500-200 MG-UNIT tablet Take by mouth.    [provider]  fluticasone furoate-vilanterol (BREO ELLIPTA) 100-25 MCG/INH AEPB Inhale  1 puff into the lungs daily. 11/16/17   Rigoberto Noel, MD  furosemide (LASIX) 40 MG tablet Take 1 tablet (40 mg total) by mouth daily. 11/20/17   Park Liter, MD  hydrALAZINE (APRESOLINE) 10 MG tablet Take 1 tablet (10 mg total) by mouth 4 (four) times daily. 11/29/17   Park Liter, MD  Multiple Vitamin (MULTIVITAMIN) tablet Take by mouth.    [provider]  potassium chloride (K-DUR) 10 MEQ tablet Take 1 tablet (10 mEq total) by mouth daily. 11/20/17 02/18/18  Park Liter, MD  pravastatin  (PRAVACHOL) 40 MG tablet Take 40 mg by mouth daily.    [provider]  umeclidinium bromide (INCRUSE ELLIPTA) 62.5 MCG/INH AEPB Inhale 1 puff into the lungs daily. 10/18/17   Shelda Pal, DO    Family History Family History  Family history unknown: Yes    Social History Social History   Tobacco Use  . Smoking status: Former Smoker    Packs/day: 1.00    Years: 25.00    Pack years: 25.00    Types: Cigarettes    Last attempt to quit: 11/30/2007    Years since quitting: 10.0  . Smokeless tobacco: Never Used  Substance Use Topics  . Alcohol use: No  . Drug use: No     Allergies   Lisinopril   Review of Systems Review of Systems  All other systems reviewed and are negative.    Physical Exam Updated Vital Signs BP 116/80 (BP Location: Right Arm)   Pulse (!) 115   Temp 98.1 F (36.7 C) (Oral)   Resp (!) 29   SpO2 98%   Physical Exam  Constitutional: He is oriented to person, place, and time. He appears well-developed and well-nourished. No distress.  HENT:  Head: Normocephalic and atraumatic.  Mouth/Throat: Oropharynx is clear and moist.  Eyes: Pupils are equal, round, and reactive to light. Conjunctivae and EOM are normal.  Neck: Normal range of motion. Neck supple.  Cardiovascular: Regular rhythm and intact distal pulses. Tachycardia present.  No murmur heard. Pulmonary/Chest: Accessory muscle usage present. Tachypnea noted. No respiratory distress. He has decreased breath sounds. He has no wheezes. He has no rales.  Abdominal: Soft. He exhibits no distension. There is no tenderness. There is no rebound and no guarding.  Musculoskeletal: Normal range of motion. He exhibits edema. He exhibits no tenderness.  1-2+ pitting edema in bilateral lower ext to the mid shin  Neurological: He is alert and oriented to person, place, and time.  Skin: Skin is warm and dry. No rash noted. No erythema.  Psychiatric: He has a normal mood and affect. His  behavior is normal.  Nursing note and vitals reviewed.    ED Treatments / Results  Labs (all labs ordered are listed, but only abnormal results are displayed) Labs Reviewed  CBC WITH DIFFERENTIAL/PLATELET - Abnormal; Notable for the following components:      Result Value   HCT 38.2 (*)    MCV 73.9 (*)    MCHC 38.2 (*)    All other components within normal limits  COMPREHENSIVE METABOLIC PANEL - Abnormal; Notable for the following components:   Glucose, Bld 176 (*)    BUN 75 (*)    Creatinine, Ser 2.84 (*)    Total Protein 8.6 (*)    AST 399 (*)    ALT 327 (*)    Alkaline Phosphatase 168 (*)    Total Bilirubin 2.8 (*)    GFR calc non  Af Amer 19 (*)    GFR calc Af Amer 22 (*)    All other components within normal limits  BRAIN NATRIURETIC PEPTIDE - Abnormal; Notable for the following components:   B Natriuretic Peptide 1,224.9 (*)    All other components within normal limits  TROPONIN I - Abnormal; Notable for the following components:   Troponin I 0.68 (*)    All other components within normal limits  I-STAT VENOUS BLOOD GAS, ED    EKG EKG Interpretation  Date/Time:  Monday December 10 2017 10:31:49 EDT Ventricular Rate:  116 PR Interval:    QRS Duration: 139 QT Interval:  362 QTC Calculation: 503 R Axis:   -77 Text Interpretation:  Sinus tachycardia Ventricular premature complex RBBB and LAFB Lateral leads are also involved Confirmed by Blanchie Dessert ([id number]) on 12/10/2017 10:36:13 AM   Radiology Dg Chest Port 1 View  Result Date: 12/10/2017 CLINICAL DATA:  Shortness of breath. EXAM: PORTABLE CHEST 1 VIEW COMPARISON:  Chest x-ray dated 09/20/2017. FINDINGS: Heart size and mediastinal contours are stable. Lungs are hyperexpanded. There are new streaky opacities at the LEFT lung base suggesting pneumonia. No pleural effusion or pneumothorax seen. No acute or suspicious osseous finding. IMPRESSION: 1. New ill-defined/streaky opacities at the LEFT lung base, suspicious  for pneumonia. 2. Hyperexpanded lungs indicating COPD. Electronically Signed   By: Franki Cabot M.D.   On: 12/10/2017 11:03    Procedures Procedures (including critical care time)  Medications Ordered in ED Medications  0.9 %  sodium chloride infusion (1,000 mLs Intravenous New Bag/Given 12/10/17 1113)  cefTRIAXone (ROCEPHIN) 1 g in sodium chloride 0.9 % 100 mL IVPB (has no administration in time range)  azithromycin (ZITHROMAX) 500 mg in sodium chloride 0.9 % 250 mL IVPB (has no administration in time range)  furosemide (LASIX) injection 40 mg (has no administration in time range)  ipratropium-albuterol (DUONEB) 0.5-2.5 (3) MG/3ML nebulizer solution 3 mL (3 mLs Nebulization Given 12/10/17 1047)  albuterol (PROVENTIL) (2.5 MG/3ML) 0.083% nebulizer solution 2.5 mg (2.5 mg Nebulization Given 12/10/17 1047)  methylPREDNISolone sodium succinate (SOLU-MEDROL) 125 mg/2 mL injection 125 mg (125 mg Intravenous Given 12/10/17 1104)  magnesium sulfate IVPB 2 g 50 mL (2 g Intravenous New Bag/Given 12/10/17 1115)  albuterol (PROVENTIL) (2.5 MG/3ML) 0.083% nebulizer solution 5 mg (5 mg Nebulization Given 12/10/17 1141)  ipratropium (ATROVENT) nebulizer solution 0.5 mg (0.5 mg Nebulization Given 12/10/17 1141)     Initial Impression / Assessment and Plan / ED Course  I have reviewed the triage vital signs and the nursing notes.  Pertinent labs & imaging results that were available during my care of the patient were reviewed by me and considered in my medical decision making (see chart for details).     Elderly male with multiple medical problems presenting today with worsening shortness of breath and hypoxia.  Patient went to see his PCP but was satting 86% on room air.  Here patient is tachypneic with decreased air movement.  Suspect that patient's symptoms are related to COPD exacerbation however could also be CHF exacerbation.  Lower suspicion for ACS as patient is not having any chest pain.  EKG does show a  right bundle branch block and nonspecific T wave changes without changes from old EKG.  Also lower concern for pneumonia at this time.  Low suspicion for PE or dissection.  CBC, CMP, BNP, troponin, chest x-ray pending.  Patient given albuterol, Atrovent, magnesium and Solu-Medrol.  With 2 L of oxygen patient's saturation is  about 93%.  11:42 AM Since chest x-ray today with streaky opacity on the left side concerning for possible pneumonia and he was covered for community-acquired pneumonia with Rocephin and azithromycin.  Secondly patient's lab work is abnormal with creatinine of 2.8 from 2.1 in mid July and elevated LFTs in the 300s.  BNP is 1200 and troponin is elevated at 0.68.  Feel that this is most likely all related to CHF.  Patient was given a dose of IV Lasix.  After interventions with albuterol, Atrovent, magnesium and Solu-Medrol patient states he is breathing slightly better but is still tachypneic and using some mild accessory muscles.  Oxygen saturation remains in the high 90s on 2 L.  Patient is requesting to be admitted at [facility] and will call there to see if there is a bed available.  12:12 PM It is still tachypneic and not improving with above therapies.  We will try BiPAP to improve his work of breathing.  Spoke with [facility] and they have no beds available at this time.  Patient is okay being admitted to [facility].  CRITICAL CARE Performed by: Trivia Heffelfinger Total critical care time: 30 minutes Critical care time was exclusive of separately billable procedures and treating other patients. Critical care was necessary to treat or prevent imminent or life-threatening deterioration. Critical care was time spent personally by me on the following activities: development of treatment plan with patient and/or surrogate as well as nursing, discussions with consultants, evaluation of patient's response to treatment, examination of patient, obtaining history from patient or  surrogate, ordering and performing treatments and interventions, ordering and review of laboratory studies, ordering and review of radiographic studies, pulse oximetry and re-evaluation of patient's condition.   Final Clinical Impressions(s) / ED Diagnoses   Final diagnoses:  Chronic obstructive pulmonary disease, unspecified COPD type ([place])  Hypoxia  Acute on chronic congestive heart failure, unspecified heart failure type ([place])  Acute kidney injury ([place])  Community acquired pneumonia of left lung, unspecified part of lung    ED Discharge Orders    None       Blanchie Dessert, MD 12/10/17 1252

## 2017-12-10 NOTE — H&P (Signed)
History and Physical  Eric Herring EVO:[id number] DOB: Sep 22, 1934 DOA: 12/10/2017  Referring physician: ED physician Eric Herring  PCP: Eric Pal, DO  Outpatient Specialists: Cardiologist Dr Eric Herring Patient coming from: Home  Chief Complaint: Worsening shortness of breath and cough  HPI: Eric Herring is a 83 y.o. male with medical history significant for COPD not on O2 supplementation, heart failure with reduced EF 30 to 35%, Pulmonary HTN, CKD 4, hyperlipidemia, hypertension, prostate cancer, lymphoma who presented to ED [facility] with complaints of shortness of breath of 4-days duration.  Patient reports symptoms have been gradually worsening. Associated with a productive cough with white to yellowish sputum. Denies chest pain or lengthy trips. Denies fever, chills, or sick contact exposure. States was on 3 L O2 continuously previously and was taken off about 2 years ago.  Upon presentation to the ED at [facility] patient was hypoxic with O2 saturation in the 86% on room air.  Lab studies remarkable for elevated BNP and troponin and also elevated LFTs.  Chest x-ray suspicious for right middle lobe infiltrates versus atelectasis and left lower lobe infiltrates versus atelectasis.  Initially on BiPAP and now on 3L nasal cannula.    ED Course: Sinus tachycardia is persistent.  O2 saturation is stable on 3 L by nasal cannula.  Started on IV azithromycin and IV ceftriaxone for community-acquired pneumonia.  Review of Systems: Review of systems as noted in the HPI. All other systems reviewed and are negative.   Past Medical History:  Diagnosis Date  . Allergy   . Arthropathy   . CAD (coronary artery disease)   . Cardiomyopathy ([place])   . Chronic kidney disease   . Colon polyp   . COPD (chronic obstructive pulmonary disease) ([place])   . Diabetes ([place])   . Diabetic neuropathy ([place])   . DOE (dyspnea on exertion)   . Heart murmur   . Hypercholesterolemia   . Hypertension    pulmonary  . LVH (left ventricular hypertrophy)   . Lymphoma ([place])   . Mitral regurgitation   . Prostate cancer ([place])   . Pulmonary hypertension ([place])   . Tricuspid regurgitation    Past Surgical History:  Procedure Laterality Date  . HEMORROIDECTOMY     pt does not remember year    Social History:  reports that he quit smoking about 10 years ago. His smoking use included cigarettes. He has a 25.00 pack-year smoking history. He has never used smokeless tobacco. He reports that he does not drink alcohol or use drugs.   Allergies  Allergen Reactions  . Lisinopril Anaphylaxis and Swelling         Family History  Family history unknown: Yes    Per the patient, family history is unknown.  Prior to Admission medications   Medication Sig Start Date End Date Taking? Authorizing Provider  albuterol (PROVENTIL HFA;VENTOLIN HFA) 108 (90 BASE) MCG/ACT inhaler Inhale 2 puffs into the lungs 2 (two) times daily.   Yes [provider]  albuterol (PROVENTIL) (2.5 MG/3ML) 0.083% nebulizer solution Take 3 mLs (2.5 mg total) by nebulization 2 (two) times daily. 12/03/17  Yes Eric Pal, DO  aspirin 81 MG tablet Take 81 mg by mouth daily.   Yes [provider]  Calcium Carb-Cholecalciferol (CALCIUM-VITAMIN D) 500-200 MG-UNIT tablet Take 1 tablet by mouth daily.    Yes [provider]  fluticasone furoate-vilanterol (BREO ELLIPTA) 100-25 MCG/INH AEPB Inhale 1 puff into the lungs daily. 11/16/17  Yes Eric Herring  V, MD  furosemide (LASIX) 40 MG tablet Take 1 tablet (40 mg total) by mouth daily. 11/20/17  Yes Eric Liter, MD  hydrALAZINE (APRESOLINE) 10 MG tablet Take 1 tablet (10 mg total) by mouth 4 (four) times daily. 11/29/17  Yes Eric Liter, MD  Multiple Vitamin (MULTIVITAMIN) tablet Take 1 tablet by mouth.    Yes [provider]  potassium chloride (K-DUR) 10 MEQ tablet Take 1 tablet (10 mEq total) by mouth daily. 11/20/17 02/18/18 Yes  Eric Liter, MD  pravastatin (PRAVACHOL) 40 MG tablet Take 40 mg by mouth daily.   Yes [provider]  umeclidinium bromide (INCRUSE ELLIPTA) 62.5 MCG/INH AEPB Inhale 1 puff into the lungs daily. 10/18/17  Yes Eric Pal, DO  budesonide-formoterol (SYMBICORT) 160-4.5 MCG/ACT inhaler Inhale 2 puffs into the lungs 2 (two) times daily. Patient not taking: Reported on 12/10/2017 09/18/17   Eric Pal, DO    Physical Exam: BP 116/86 (BP Location: Right Arm)   Pulse (!) 165   Temp 98.2 F (36.8 C) (Oral)   Resp (!) 32   Ht 5\' 6"  (1.676 m)   Wt 72.8 kg (160 lb 7.9 oz)   SpO2 98%   BMI 25.90 kg/m   . General: 83 y.o. year-old male well developed well nourished.  Appears uncomfortable due to dyspnea at rest.  Alert and oriented x3. . Cardiovascular: Regular rate and rhythm with no rubs or gallops.  No thyromegaly or JVD noted.  1+ pitting edema lower extremity bilaterally. 2/4 pulses in all 4 extremities. Marland Kitchen Respiratory: Diffuse rales bilaterally with no wheezes or rales. Good inspiratory effort. . Abdomen: Soft nontender nondistended with normal bowel sounds x4 quadrants. . Muskuloskeletal: No cyanosis, clubbing.  1+ pitting edema in lower extremities noted bilaterally . Neuro: CN II-XII intact, strength, sensation, reflexes . Skin: No ulcerative lesions noted or rashes . Psychiatry: Judgement and insight appear normal. Mood is appropriate for condition and setting          Labs on Admission:  Basic Metabolic Panel: Recent Labs  Lab 12/10/17 1038  NA 139  K 4.9  CL 99  CO2 26  GLUCOSE 176*  BUN 75*  CREATININE 2.84*  CALCIUM 9.9   Liver Function Tests: Recent Labs  Lab 12/10/17 1038  AST 399*  ALT 327*  ALKPHOS 168*  BILITOT 2.8*  PROT 8.6*  ALBUMIN 3.8   No results for input(s): LIPASE, AMYLASE in the last 168 hours. No results for input(s): AMMONIA in the last 168 hours. CBC: Recent Labs  Lab 12/10/17 1038  WBC 8.8    NEUTROABS 7.7  HGB 14.6  HCT 38.2*  MCV 73.9*  PLT 156   Cardiac Enzymes: Recent Labs  Lab 12/10/17 1038  TROPONINI 0.68*    BNP (last 3 results) Recent Labs    12/10/17 1038  BNP 1,224.9*    ProBNP (last 3 results) Recent Labs    11/29/17 1155  PROBNP 4,831*    CBG: No results for input(s): GLUCAP in the last 168 hours.  Radiological Exams on Admission: Dg Chest Port 1 View  Result Date: 12/10/2017 CLINICAL DATA:  Shortness of breath. EXAM: PORTABLE CHEST 1 VIEW COMPARISON:  Chest x-ray dated 09/20/2017. FINDINGS: Heart size and mediastinal contours are stable. Lungs are hyperexpanded. There are new streaky opacities at the LEFT lung base suggesting pneumonia. No pleural effusion or pneumothorax seen. No acute or suspicious osseous finding. IMPRESSION: 1. New ill-defined/streaky opacities at the LEFT lung base, suspicious for  pneumonia. 2. Hyperexpanded lungs indicating COPD. Electronically Signed   By: Franki Cabot M.D.   On: 12/10/2017 11:03    EKG: I independently viewed the EKG done and my findings are as followed: Sinus tachycardia with rate of 116.  Prolonged QTC 503.  Assessment/Plan Present on Admission: . Acute on chronic respiratory failure with hypoxia (HCC)  Active Problems:   Acute on chronic respiratory failure with hypoxia (HCC)  Acute on chronic hypoxic respiratory failure 2/2 to CAP vs others Per the patient he was previously on chronic O2 supplementation 3 L continuously up until 2 years ago 86% on room air on presentation Suspicion for community-acquired pneumonia versus others Independently reviewed chest x-ray done on admission which revealed right middle lobe and left lower lobe infiltrates versus atelectasis Obtain d-dimer Obtain stat CT chest due to persistent tachycardia and elevated troponin to rule out PE  BiPAP at bedside-make n.p.o. when uses BiPAP to avoid aspiration  Community-acquired pneumonia Continue IV azithromycin IV  ceftriaxone Obtain sputum culture Start duo nebs every 6 hours and every 2 hours as needed Continue home COPD medications Pulmonary toilet with hypersaline nebs 3 times daily O2 supplementation to maintain O2 saturation between 88 and 92%  Elevated troponin suspect demand ischemia from hypoxia and tachycardia Denies chest pain Cycle troponin x3 Continue to monitor on telemetry  Consult cardiology in the morning N.p.o. after midnight  Sinus tachycardia most likely multifactorial secondary to hypoxia versus others Rule out PE Blood pressure is soft Lopressor IV 2.5 mg once If persistent may consider starting Cardizem drip   QTC prolongation Independently reviewed twelve-lead EKG done on admission which revealed QTC of 503 Avoid QTC prolonging agents Repeat twelve-lead EKG in the morning  AKI on CKD 4 Creatinine on presentation 2.84 Baseline creatinine 2.14 Avoid nephrotoxic agents Hold Lasix for now Repeat BMP in the morning  Chronic systolic CHF with LVEF 30 to 35% Last 2D echo done on 11/01/2017 revealed LVEF 30 to 35% with wall motion normal Will need to consult cardiology in the morning Not on core measures medications Not on beta-blocker Strict I's and O Daily weight  Acute transaminitis Unclear etiology Obtain acute hepatitis panel If persistent may consider obtaining an abdominal ultrasound Repeat CMP in the morning  Hyperbilirubinemia Bilirubin 2.8 Repeat LFTs in the morning Management as stated above  Risks:  High risk for decompensation due to acute on chronic hypoxic respiratory failure, multiple comorbidities, and advanced age.   DVT prophylaxis: Subcu heparin 3 times daily  Code Status: DNR  Family Communication: Wife at bedside  Disposition Plan: Admit to stepdown unit  Consults called: Please consult cardiology in the morning  Admission status: Inpatient  Patient will require at least 2 midnights for further testings and treatment of  present condition.    Kayleen Memos MD [facility] Pager [phone]  If 7PM-7AM, please contact night-coverage [email or web address] Password [email or web address]  12/10/2017, 7:05 PM

## 2017-12-10 NOTE — Progress Notes (Signed)
Hall DO notified by RN of increased HR 150s-170s. Pt denies chest pain. MD ordering lopressor and chest CT and D Dimer

## 2017-12-10 NOTE — Progress Notes (Signed)
Patient with h/o pulmonary HTN; prostate CA; lymphoma; HLD; DM; COPD (currently off home O2); CAD; and CKD presenting with SOB.  He went to his PCP with O2 sats 86% and so they sent him to [facility]  Cough, SOB, no fever for several days.  Chronic LE edema, maybe better, but not really taking Lasix.  Decreased breath sounds, tachypnea on exam.  Labs impressive for slightly worsening renal function, LFTs up, BNP and troponin elevated.  Denies CP.  Given Lasix and breathing treatments.  Marked improvement on BIPAP.  Covered for CAP just in case.  Needs admission to SDU.  Carlyon Shadow, M.D.

## 2017-12-10 NOTE — Progress Notes (Signed)
RT received pt on BIPAP. Pt was in no distress. RT placed pt on 3Lnc/sats 98%. Pt is speaking full sentences with no increased WOB. Pt tol well. RT placed BIPAP in room on standby

## 2017-12-11 DIAGNOSIS — E1149 Type 2 diabetes mellitus with other diabetic neurological complication: Secondary | ICD-10-CM

## 2017-12-11 DIAGNOSIS — N170 Acute kidney failure with tubular necrosis: Secondary | ICD-10-CM

## 2017-12-11 DIAGNOSIS — R600 Localized edema: Secondary | ICD-10-CM | POA: Insufficient documentation

## 2017-12-11 DIAGNOSIS — I5020 Unspecified systolic (congestive) heart failure: Secondary | ICD-10-CM | POA: Insufficient documentation

## 2017-12-11 DIAGNOSIS — J479 Bronchiectasis, uncomplicated: Secondary | ICD-10-CM

## 2017-12-11 DIAGNOSIS — J9621 Acute and chronic respiratory failure with hypoxia: Secondary | ICD-10-CM

## 2017-12-11 DIAGNOSIS — I272 Pulmonary hypertension, unspecified: Secondary | ICD-10-CM

## 2017-12-11 DIAGNOSIS — J432 Centrilobular emphysema: Secondary | ICD-10-CM

## 2017-12-11 DIAGNOSIS — N289 Disorder of kidney and ureter, unspecified: Secondary | ICD-10-CM

## 2017-12-11 DIAGNOSIS — J449 Chronic obstructive pulmonary disease, unspecified: Secondary | ICD-10-CM

## 2017-12-11 DIAGNOSIS — N184 Chronic kidney disease, stage 4 (severe): Secondary | ICD-10-CM

## 2017-12-11 DIAGNOSIS — J189 Pneumonia, unspecified organism: Secondary | ICD-10-CM

## 2017-12-11 DIAGNOSIS — I420 Dilated cardiomyopathy: Secondary | ICD-10-CM

## 2017-12-11 DIAGNOSIS — I50811 Acute right heart failure: Secondary | ICD-10-CM

## 2017-12-11 DIAGNOSIS — N189 Chronic kidney disease, unspecified: Secondary | ICD-10-CM

## 2017-12-11 DIAGNOSIS — N179 Acute kidney failure, unspecified: Secondary | ICD-10-CM

## 2017-12-11 DIAGNOSIS — I480 Paroxysmal atrial fibrillation: Secondary | ICD-10-CM

## 2017-12-11 DIAGNOSIS — I5043 Acute on chronic combined systolic (congestive) and diastolic (congestive) heart failure: Secondary | ICD-10-CM

## 2017-12-11 HISTORY — DX: Disorder of kidney and ureter, unspecified: N28.9

## 2017-12-11 HISTORY — DX: Chronic kidney disease, unspecified: N18.9

## 2017-12-11 LAB — CBC
HEMATOCRIT: 35.1 % — AB (ref 39.0–52.0)
HEMOGLOBIN: 12.6 g/dL — AB (ref 13.0–17.0)
MCH: 27.6 pg (ref 26.0–34.0)
MCHC: 35.9 g/dL (ref 30.0–36.0)
MCV: 76.8 fL — AB (ref 78.0–100.0)
Platelets: 157 10*3/uL (ref 150–400)
RBC: 4.57 MIL/uL (ref 4.22–5.81)
RDW: 14.2 % (ref 11.5–15.5)
WBC: 11.8 10*3/uL — AB (ref 4.0–10.5)

## 2017-12-11 LAB — RAPID URINE DRUG SCREEN, HOSP PERFORMED
Amphetamines: NOT DETECTED
Barbiturates: NOT DETECTED
Benzodiazepines: NOT DETECTED
Cocaine: NOT DETECTED
OPIATES: NOT DETECTED
TETRAHYDROCANNABINOL: NOT DETECTED

## 2017-12-11 LAB — COMPREHENSIVE METABOLIC PANEL
ALBUMIN: 3 g/dL — AB (ref 3.5–5.0)
ALT: 272 U/L — ABNORMAL HIGH (ref 0–44)
ANION GAP: 16 — AB (ref 5–15)
AST: 244 U/L — AB (ref 15–41)
Alkaline Phosphatase: 145 U/L — ABNORMAL HIGH (ref 38–126)
BILIRUBIN TOTAL: 2.4 mg/dL — AB (ref 0.3–1.2)
BUN: 77 mg/dL — AB (ref 8–23)
CO2: 24 mmol/L (ref 22–32)
Calcium: 9.5 mg/dL (ref 8.9–10.3)
Chloride: 102 mmol/L (ref 98–111)
Creatinine, Ser: 2.84 mg/dL — ABNORMAL HIGH (ref 0.61–1.24)
GFR calc Af Amer: 22 mL/min — ABNORMAL LOW (ref >60)
GFR calc non Af Amer: 19 mL/min — ABNORMAL LOW (ref >60)
Glucose, Bld: 167 mg/dL — ABNORMAL HIGH (ref 70–99)
POTASSIUM: 4.6 mmol/L (ref 3.5–5.1)
Sodium: 142 mmol/L (ref 135–145)
TOTAL PROTEIN: 7.3 g/dL (ref 6.5–8.1)

## 2017-12-11 LAB — EXPECTORATED SPUTUM ASSESSMENT W REFEX TO RESP CULTURE

## 2017-12-11 LAB — GLUCOSE, CAPILLARY
GLUCOSE-CAPILLARY: 155 mg/dL — AB (ref 70–99)
GLUCOSE-CAPILLARY: 148 mg/dL — AB (ref 70–99)
GLUCOSE-CAPILLARY: 143 mg/dL — AB (ref 70–99)
Glucose-Capillary: 160 mg/dL — ABNORMAL HIGH (ref 70–99)

## 2017-12-11 LAB — TROPONIN I
Troponin I: 0.93 ng/mL (ref <0.03)
Troponin I: 0.78 ng/mL (ref <0.03)

## 2017-12-11 LAB — EXPECTORATED SPUTUM ASSESSMENT W GRAM STAIN, RFLX TO RESP C

## 2017-12-11 MED ORDER — HEPARIN BOLUS VIA INFUSION
3000.0000 [IU] | Freq: Once | INTRAVENOUS | Status: AC
  Administered 2017-12-11: 3000 [IU] via INTRAVENOUS
  Filled 2017-12-11: qty 3000

## 2017-12-11 MED ORDER — HEPARIN (PORCINE) IN NACL 100-0.45 UNIT/ML-% IJ SOLN
1150.0000 [IU]/h | INTRAMUSCULAR | Status: DC
Start: 2017-12-11 — End: 2017-12-13
  Administered 2017-12-11: 1050 [IU]/h via INTRAVENOUS
  Administered 2017-12-12: 1150 [IU]/h via INTRAVENOUS
  Filled 2017-12-11 (×2): qty 250

## 2017-12-11 MED ORDER — AMIODARONE LOAD VIA INFUSION
150.0000 mg | Freq: Once | INTRAVENOUS | Status: AC
  Administered 2017-12-11: 150 mg via INTRAVENOUS
  Filled 2017-12-11: qty 83.34

## 2017-12-11 MED ORDER — AMIODARONE HCL IN DEXTROSE 360-4.14 MG/200ML-% IV SOLN
30.0000 mg/h | INTRAVENOUS | Status: DC
  Administered 2017-12-12 – 2017-12-13 (×3): 30 mg/h via INTRAVENOUS
  Filled 2017-12-11 (×3): qty 200

## 2017-12-11 MED ORDER — AMIODARONE HCL IN DEXTROSE 360-4.14 MG/200ML-% IV SOLN
60.0000 mg/h | INTRAVENOUS | Status: AC
  Administered 2017-12-11 (×2): 60 mg/h via INTRAVENOUS
  Filled 2017-12-11 (×2): qty 200

## 2017-12-11 MED ORDER — ALBUTEROL SULFATE (2.5 MG/3ML) 0.083% IN NEBU: 2.5000 mg | INHALATION_SOLUTION | RESPIRATORY_TRACT | Status: DC | PRN

## 2017-12-11 MED ORDER — INSULIN ASPART 100 UNIT/ML ~~LOC~~ SOLN
0.0000 [IU] | Freq: Three times a day (TID) | SUBCUTANEOUS | Status: DC
  Administered 2017-12-11 (×2): 2 [IU] via SUBCUTANEOUS
  Administered 2017-12-11: 1 [IU] via SUBCUTANEOUS
  Administered 2017-12-12: 2 [IU] via SUBCUTANEOUS
  Administered 2017-12-12 – 2017-12-13 (×3): 1 [IU] via SUBCUTANEOUS

## 2017-12-11 NOTE — Progress Notes (Signed)
Patient currently on [facility] with sat of 99% and is in no distress. BIPAP is at bedside but not needed at this time. Will continue to monitor.

## 2017-12-11 NOTE — Consult Note (Addendum)
Cardiology Consultation:   Patient ID: Eric Herring; [id number]; July 02, 1934   Admit date: 12/10/2017 Date of Consult: 12/11/2017  Primary Care Provider: Shelda Pal, DO Primary Cardiologist: Jenne Campus, MD   Patient Profile:   Eric Herring is a 83 y.o. male with a hx of dilated cardiomyopathy with EF 30%, COPD on home oxygen, intermittent CPAP, stage IV CKD, DM type II, neuropathy, HLD, hypertension, pulmonary hypertension, MR, prostate cancer and lymphoma who is being seen today for the evaluation of decreased EF, afib and elevated troponin at the request of Dr. Bonner Puna.  History of Present Illness:   Eric Herring was seen in our office by Dr. Agustin Cree on 11/20/2017 at the request of his PCP for evaluation of congestive heart failure/cardiomyopathy.  Was noted that for about the prior 2 years the patient had been experiencing shortness of breath however this had worsened over the prior few weeks with the onset of lower extremity edema.  He was also having paroxysmal nocturnal dyspnea and he had to sleep with his head  elevated.  He was given furosemide with good response and an echocardiogram was done in June showing an EF of 30-35% with moderate mitral regurgitation and pulmonary hypertension.  It was noted that he does not snore.  Upon chart review it is noted that an echocardiogram done at [facility] on 11/06/2014 showed an EF of 35-40% with mild to moderate global LV hypokinesis, moderate MR and moderate TR. his EF was noted to have been about the same as on his cath in 09/25/2011.  I see that he had a procedure by Dr. Wyline Copas, an interventional cardiologist, however I do not see the cath report in care everywhere.  Eric Herring presented to [facility] with a 4-day history of shortness of breath.  He was hypoxic on room air.  BNP was elevated at 1224.9.  Troponins are elevated at 0.68, 0.83, 0.93, 0.78.Marland Kitchen  Chest x-ray showed new ill-defined/streaky opacities at the left lung base, suspicious  for pneumonia, hyper expanded lungs indicating COPD.  Serum creatinine is elevated at 2.84 with baseline around 1.4-1.8.  He has elevated liver function test.  Electrolytes are normal.  CT of the chest showed findings suggestive of underlying COPD and stable pulmonary nodules compared to prior study in 2016.  It also showed three-vessel coronary artery disease.  [name] developed atrial fibrillation and is now on a Cardizem drip.  [name] says that he was having trouble breathing and coughing with dark sputum so he went by his PCP office yesterday but could not be seen by the doctor.  They sent him to the hospital.  The patient denies any chest pain/pressure/tightness.  He states that he has chronic 3 pillow orthopnea.  He denies lower extremity edema.  He denies palpitations or lightheadedness.  He denies any prior MI or arrhythmias.  He does not know whether he has ever had a stent and does not know the results of the catheterization that was done in 2013.  Eric Herring lives at home with his wife and is independent.  He does housework as well as yard work.  He says he has been having more trouble breathing this summer and attributes it to the pollen and it seems to be worse when he sat in the heat cutting grass.  He wears oxygen most of the time and uses nebulizer 5 times per day.  Past Medical History:  Diagnosis Date  . Allergy   . Arthropathy   . CAD (  coronary artery disease)   . Cardiomyopathy ([place])   . Chronic kidney disease   . Colon polyp   . COPD (chronic obstructive pulmonary disease) ([place])   . Diabetes ([place])   . Diabetic neuropathy ([place])   . DOE (dyspnea on exertion)   . Heart murmur   . Hypercholesterolemia   . Hypertension    pulmonary  . LVH (left ventricular hypertrophy)   . Lymphoma ([place])   . Mitral regurgitation   . Prostate cancer ([place])   . Pulmonary hypertension ([place])   . Tricuspid regurgitation     Past Surgical History:  Procedure Laterality Date  .  HEMORROIDECTOMY     pt does not remember year     Home Medications:  Prior to Admission medications   Medication Sig Start Date End Date Taking? Authorizing Provider  albuterol (PROVENTIL HFA;VENTOLIN HFA) 108 (90 BASE) MCG/ACT inhaler Inhale 2 puffs into the lungs 2 (two) times daily.   Yes [provider]  albuterol (PROVENTIL) (2.5 MG/3ML) 0.083% nebulizer solution Take 3 mLs (2.5 mg total) by nebulization 2 (two) times daily. 12/03/17  Yes Shelda Pal, DO  aspirin 81 MG tablet Take 81 mg by mouth daily.   Yes [provider]  Calcium Carb-Cholecalciferol (CALCIUM-VITAMIN D) 500-200 MG-UNIT tablet Take 1 tablet by mouth daily.    Yes [provider]  fluticasone furoate-vilanterol (BREO ELLIPTA) 100-25 MCG/INH AEPB Inhale 1 puff into the lungs daily. 11/16/17  Yes Rigoberto Noel, MD  furosemide (LASIX) 40 MG tablet Take 1 tablet (40 mg total) by mouth daily. 11/20/17  Yes Park Liter, MD  hydrALAZINE (APRESOLINE) 10 MG tablet Take 1 tablet (10 mg total) by mouth 4 (four) times daily. 11/29/17  Yes Park Liter, MD  Multiple Vitamin (MULTIVITAMIN) tablet Take 1 tablet by mouth.    Yes [provider]  potassium chloride (K-DUR) 10 MEQ tablet Take 1 tablet (10 mEq total) by mouth daily. 11/20/17 02/18/18 Yes Park Liter, MD  pravastatin (PRAVACHOL) 40 MG tablet Take 40 mg by mouth daily.   Yes [provider]  umeclidinium bromide (INCRUSE ELLIPTA) 62.5 MCG/INH AEPB Inhale 1 puff into the lungs daily. 10/18/17  Yes Shelda Pal, DO  budesonide-formoterol (SYMBICORT) 160-4.5 MCG/ACT inhaler Inhale 2 puffs into the lungs 2 (two) times daily. Patient not taking: Reported on 12/10/2017 09/18/17   Shelda Pal, DO    Inpatient Medications: Scheduled Meds: . aspirin  81 mg Oral q1800  . fluticasone furoate-vilanterol  1 puff Inhalation Q2000  . heparin injection (subcutaneous)  5,000 Units Subcutaneous  Q8H  . hydrALAZINE  10 mg Oral QID  . insulin aspart  0-9 Units Subcutaneous TID WC  . ipratropium-albuterol  3 mL Nebulization Q6H  . pravastatin  40 mg Oral q1800  . sodium chloride HYPERTONIC  4 mL Nebulization TID   Continuous Infusions: . sodium chloride 10 mL/hr at 12/11/17 0505  . azithromycin 250 mL/hr at 12/11/17 1400  . cefTRIAXone (ROCEPHIN)  IV 1 g (12/11/17 1246)  . diltiazem (CARDIZEM) infusion 10 mg/hr (12/11/17 1042)   PRN Meds: sodium chloride  Allergies:    Allergies  Allergen Reactions  . Lisinopril Anaphylaxis and Swelling         Social History:   Social History   Socioeconomic History  . Marital status: Married    Spouse name: Not on file  . Number of children: Not on file  . Years of education: Not on file  . Highest education  level: Not on file  Occupational History  . Not on file  Social Needs  . Financial resource strain: Not on file  . Food insecurity:    Worry: Not on file    Inability: Not on file  . Transportation needs:    Medical: Not on file    Non-medical: Not on file  Tobacco Use  . Smoking status: Former Smoker    Packs/day: 1.00    Years: 25.00    Pack years: 25.00    Types: Cigarettes    Last attempt to quit: 11/30/2007    Years since quitting: 10.0  . Smokeless tobacco: Never Used  Substance and Sexual Activity  . Alcohol use: No  . Drug use: No  . Sexual activity: Not on file  Lifestyle  . Physical activity:    Days per week: Not on file    Minutes per session: Not on file  . Stress: Not on file  Relationships  . Social connections:    Talks on phone: Not on file    Gets together: Not on file    Attends religious service: Not on file    Active member of club or organization: Not on file    Attends meetings of clubs or organizations: Not on file    Relationship status: Not on file  . Intimate partner violence:    Fear of current or ex partner: Not on file    Emotionally abused: Not on file    Physically  abused: Not on file    Forced sexual activity: Not on file  Other Topics Concern  . Not on file  Social History Narrative  . Not on file    Family History:    Family History  Family history unknown: Yes     ROS:  Please see the history of present illness.   All other ROS reviewed and negative.     Physical Exam/Data:   Vitals:   12/11/17 1330 12/11/17 1342 12/11/17 1400 12/11/17 1430  BP: 103/71  104/66 91/70  Pulse: (!) 107 (!) 107 (!) 111 94  Resp:  (!) 24    Temp:      TempSrc:      SpO2: 98% 97% 99% 99%  Weight:      Height:        Intake/Output Summary (Last 24 hours) at 12/11/2017 1516 Last data filed at 12/11/2017 1400 Gross per 24 hour  Intake 604.77 ml  Output 650 ml  Net -45.23 ml   Filed Weights   12/10/17 1819 12/11/17 0300  Weight: 160 lb 7.9 oz (72.8 kg) 161 lb 9.6 oz (73.3 kg)   Body mass index is 26.08 kg/m.  General:  Thin male, in no acute distress HEENT: normal Lymph: no adenopathy Neck: + JVD Endocrine:  No thryomegaly Vascular: No carotid bruits; FA pulses 2+ bilaterally without bruits  Cardiac:  normal S1, S2; Irregularly irregular rhythm; no murmur  Lungs:  Decreased air movement with faint exp wheezes and crackles in left base.  Abd: soft, nontender, no hepatomegaly  Ext: no edema Musculoskeletal:  No deformities, BUE and BLE strength normal and equal Skin: warm and dry  Neuro:  CNs 2-12 intact, no focal abnormalities noted Psych:  Normal affect   EKG:  The EKG was personally reviewed and demonstrates:  Atrial fibrillation, LAD, RBBB, 94 bpm Telemetry:  Telemetry was personally reviewed and demonstrates:  Afib 150's last night to 90's-100's  Relevant CV Studies:  Echocardiogram 11/02/2017 Study Conclusions - Left ventricle:  The cavity size was normal. Wall thickness was   increased in a pattern of moderate LVH. Systolic function was   moderately to severely reduced. The estimated ejection fraction   was in the range of 30% to  35%. Wall motion was normal; there   were no regional wall motion abnormalities. - Mitral valve: There was moderate regurgitation. - Left atrium: The atrium was mildly to moderately dilated. - Tricuspid valve: There was mild-moderate regurgitation. - Pulmonary arteries: PA peak pressure: 54 mm Hg (S).  Impressions: - 1. Left ventricular systolic function is moderately depressed   with estimated EF at 30 to 35%. Moderate concentric left   ventricular hypertrophy is noted.   2. Moderate left atrial enlargement.   3. Moderate mitral regurgitation.   4. Mild to moderate tricuspid regurgitation.   5. Calculated pulmonary artery systolic pressure is approximately   55 mmHg. Moderate pulmonary hypertension.   6. The effective ejection fraction is lower than mentioned above   in view of the degree of mitral regurgitation noted.  Echocardiogram at [facility] 11/06/2014 Summary Mild concentric left ventricular hypertrophy Mild to moderate global LV hypokinesis. Ejection fraction is visually estimated at 35-40% Moderate mitral regurgitation Moderate tricuspid regurgitation Estimated pulmonary artery pressure is 79mmHg. LVEF is about the same in comparison to cath report on 10/03/2011  Laboratory Data:  Chemistry Recent Labs  Lab 12/10/17 1038 12/11/17 0232  NA 139 142  K 4.9 4.6  CL 99 102  CO2 26 24  GLUCOSE 176* 167*  BUN 75* 77*  CREATININE 2.84* 2.84*  CALCIUM 9.9 9.5  GFRNONAA 19* 19*  GFRAA 22* 22*  ANIONGAP 14 16*    Recent Labs  Lab 12/10/17 1038 12/11/17 0232  PROT 8.6* 7.3  ALBUMIN 3.8 3.0*  AST 399* 244*  ALT 327* 272*  ALKPHOS 168* 145*  BILITOT 2.8* 2.4*   Hematology Recent Labs  Lab 12/10/17 1038 12/11/17 0735  WBC 8.8 11.8*  RBC 5.17 4.57  HGB 14.6 12.6*  HCT 38.2* 35.1*  MCV 73.9* 76.8*  MCH 28.2 27.6  MCHC 38.2* 35.9  RDW 15.1 14.2  PLT 156 157   Cardiac Enzymes Recent Labs  Lab 12/10/17 1038 12/10/17 1940  12/11/17 0232 12/11/17 0735  TROPONINI 0.68* 0.83* 0.93* 0.78*   No results for input(s): TROPIPOC in the last 168 hours.  BNP Recent Labs  Lab 12/10/17 1038  BNP 1,224.9*    DDimer  Recent Labs  Lab 12/10/17 1940  DDIMER 1.86*    Radiology/Studies:  Ct Chest Wo Contrast  Result Date: 12/10/2017 CLINICAL DATA:  83 year old male hypoxic with oxygen saturation 86% on room air. EXAM: CT CHEST WITHOUT CONTRAST TECHNIQUE: Multidetector CT imaging of the chest was performed following the standard protocol without IV contrast. COMPARISON:  Chest CT 11/04/2014. FINDINGS: Cardiovascular: Heart size is borderline enlarged. There is no significant pericardial fluid, thickening or pericardial calcification. There is aortic atherosclerosis, as well as atherosclerosis of the great vessels of the mediastinum and the coronary arteries, including calcified atherosclerotic plaque in the left main, left anterior descending, left circumflex and right coronary arteries. Mediastinum/Nodes: No pathologically enlarged mediastinal or hilar lymph nodes. Please note that accurate exclusion of hilar adenopathy is limited on noncontrast CT scans. Esophagus is unremarkable in appearance. No axillary lymphadenopathy. Lungs/Pleura: Diffuse bronchial wall thickening with moderate centrilobular and paraseptal emphysema. Scattered areas of mild cylindrical bronchiectasis, most evident in the lower lobes of the lungs bilaterally (left greater than right). Extensive mucoid impaction within  the left lower lobe where there is also thickening of the peribronchovascular interstitium as well as peribronchovascular airspace consolidation throughout the left lower lobe, concerning for sequela of recent aspiration and/or developing bronchopneumonia. Irregular areas of airspace consolidation are also noted in a peribronchovascular distribution in the dependent portion of the left upper lobe posteriorly. Right lung appears relatively clear.  No pleural effusions. A few scattered small pulmonary nodules are noted in the lungs bilaterally, largest of which is in the periphery of the right middle lobe (axial image 100 of series 4), unchanged. No larger more suspicious appearing pulmonary nodules or masses are noted. Upper Abdomen: Aortic atherosclerosis. Calcification in the inferior vena cava, similar to the prior examination, likely calcified chronic thrombus. Musculoskeletal: There are no aggressive appearing lytic or blastic lesions noted in the visualized portions of the skeleton. IMPRESSION: 1. Scattered areas of bronchiectasis in the lungs bilaterally, most evident in the lower lobes. In the dependent portions of the left lung there is extensive peribronchovascular airspace consolidation, concerning for sequela of recent aspiration or developing multilobar bronchopneumonia. 2. Diffuse bronchial wall thickening with moderate centrilobular and paraseptal emphysema; imaging findings suggestive of underlying COPD. 3. Multiple small pulmonary nodules measuring 5 mm or less in size, similar to the prior study from 2016, considered definitively benign. 4. Aortic atherosclerosis, in addition to left main and 3 vessel coronary artery disease. 5. Mild cardiomegaly. 6. Additional incidental findings, similar prior studies, as above. Aortic Atherosclerosis (ICD10-I70.0) and Emphysema (ICD10-J43.9). Electronically Signed   By: Vinnie Langton M.D.   On: 12/10/2017 21:59   Dg Chest Port 1 View  Result Date: 12/10/2017 CLINICAL DATA:  Shortness of breath. EXAM: PORTABLE CHEST 1 VIEW COMPARISON:  Chest x-ray dated 09/20/2017. FINDINGS: Heart size and mediastinal contours are stable. Lungs are hyperexpanded. There are new streaky opacities at the LEFT lung base suggesting pneumonia. No pleural effusion or pneumothorax seen. No acute or suspicious osseous finding. IMPRESSION: 1. New ill-defined/streaky opacities at the LEFT lung base, suspicious for pneumonia. 2.  Hyperexpanded lungs indicating COPD. Electronically Signed   By: Franki Cabot M.D.   On: 12/10/2017 11:03    Assessment and Plan:   Acute on chronic systolic and diastolic CHF/dilated cardiomyopathy -Recent decrease in EF to  30-35% with moderate mitral regurgitation and pulmonary hypertension, down from 35-40% in 2016 (at [facility]).  Seen by Dr. Agustin Cree in the office 7/16 and 11/29/17 for HF symptoms and improved with diuresis.  - admitted with 4-day history of shortness of breath and hypoxia on room air.   -BNP elevated at 1224.9.   -Troponins are elevated at 0.68, 0.83, 0.93, 0.78..  -Chest x-ray showed new ill-defined/streaky opacities at the left lung base, suspicious for pneumonia, hyper expanded lungs indicating COPD.   -Serum creatinine is elevated at 2.84 with baseline around 1.4-1.8.  He has elevated liver function tests.  Electrolytes are normal.   -CT of the chest suspicious for aspiration pneumonia and imaging findings suggestive of underlying COPD and stable pulmonary nodules compared to prior study in 2016.  -Lasix 40 mg IV given once, currently on hold due to renal function -Allergy to lisinopril noted.   Atrial fibrillation with RVR -Patient does not appear to previous history of A. Fib -Initially heart rate was sinus tachycardia at 115 bpm and then he developed A. fib with rates in the 150s. On cardizem drip. As of around 2300 last night his rate has been fairly controlled in the 90s-100.  The patient is asymptomatic. -With his LV  dysfunction, a CCB is not the optimal medication for control of his afib. With current wheezing may not tolerate a BB. Will discuss with Dr. Meda Coffee -CHA2DS2/VAS Stroke Risk Score is 5  (CHF, HTN, Age (2), DM). He will need anticoagulation for stroke risk reduction. Heparin drip and then DOAC once no interventions needed.   Elevated troponins -Troponins are elevated at 0.68, 0.83, 0.93, 0.78.  Flat pattern consistent with demand ischemia related to  CHF -Unknown prior CAD history.  -three-vessel coronary artery disease on CT -With lack of chest pain and his renal function would not take to cath lab at this time.  -Dr. Meda Coffee to see pt.   Acute on chronic respiratory failure -Dimer elevated at 1.86 -Chest x-ray and CT suspicious for pneumonia.  Patient has been started on IV antibiotics. Possible aspiration PNA. Mild leukocytosis.   Hypertension -Hydralazine 10 mg 4 times daily  CAD -Hx of CAD with cath and probable intervention in 2013, unable to see report in Epic. Coronary atherosclerosis noted on CT -Treated with aspirin and statin. Would stop aspirin when anticoagulation started.   Hyperlipidemia -Pravastatin 40 mg daily  For questions or updates, please contact [place] Please consult [email or web address] for contact info under Cardiology/STEMI.   Signed, Daune Perch, NP  12/11/2017 3:16 PM   The patient was seen, examined and discussed with Daune Perch, NP-C and I agree with the above.   83 y.o. male with a hx of dilated cardiomyopathy with LVEF 30-35% in June 2019, COPD on home oxygen, intermittent CPAP, stage IV CKD, DM type II, neuropathy, HLD, hypertension, pulmonary hypertension, MR, prostate cancer and lymphoma who is being seen today for the evaluation of CHF, afib with RVR and elevated troponin. Seen by Dr. Agustin Cree on 11/20/2017 at the request of his PCP for evaluation of congestive heart failure/cardiomyopathy.  Was noted that for about the prior 2 years the patient had been experiencing shortness of breath, however this had worsened over the prior few weeks with the onset of lower extremity edema, orthopnea and PND. No chest pain, the patient states that he has always been very active and kept pushing himself despite SOB.  Echo in June showing an EF of 30-35% with moderate mitral regurgitation and pulmonary hypertension.   Echo at [facility] in 2016 showed an EF of 35-40% with mild to moderate global LV  hypokinesis, moderate MR and moderate TR. Prior cath in 2013 by Dr Wyline Copas, no report available.  On admission hypoxia, BNP was elevated at 1224.9.  Troponins are elevated at 0.68, 0.83, 0.93, 0.78.Marland Kitchen  Chest x-ray showed new ill-defined/streaky opacities at the left lung base, suspicious for pneumonia, hyper expanded lungs indicating COPD.  Serum creatinine is elevated at 2.84 with baseline around 1.4-1.8.  He has elevated liver function test.  Electrolytes are normal.  CT of the chest showed findings suggestive of underlying COPD and stable pulmonary nodules compared to prior study in 2016.  It also showed three-vessel coronary artery disease. Mr. Schranz developed atrial fibrillation and is now on a Cardizem drip.  Assessment and plan: Acute on chronic combined systolic and diastolic CHF Acute RV failure with elevated LFTs  COPD NSTEMI Acute on chronic kidney failure Atrial fibrillation with RVR  This is a very complicated patient with multiorgan failure whose prognosis is poor. He doesn't seem to be significantly fluid overloaded, his elevated JVDs are secondary to RV failure (on echo review RVEF is at least moderately decreased) and moderate pulmonary hypertension.  His CHF worse with a-fib  with RVR, I would switch to amiodarone drip as he is hypotensive with Cardizem, unable to use BB with wheezing. Unable to start ACEI/ARB, once BP improved I would start low dose imdur 15 mg in addition to low dose of hydralazine.  He is not a left heart cath candidate given acute on chronic kidney failure with oliguria. He might need a right sided cardiac cath later this admission, I will ask CHF team for further help.   Ena Dawley, MD 12/11/2017

## 2017-12-11 NOTE — Care Management Note (Addendum)
Case Management Note  Patient Details  Name: Eric Herring MRN: [id number] Date of Birth: October 27, 1934  Subjective/Objective:    From home with spouse, presents with acute/chronic resp failure secondary to CAP, increased trops, tachy, aki, chf .  8/8 Tomi Bamberger RN, BSN- NCM gave patient the Eliquis 30 day savings coupon, and informed him and daughter of co pmt amt of 45.0 for refills.  Patient may also need home oxygen per RN, NCM awaiting ambulatory saturations. Patient is now interested in going to SNF at dc.                 Action/Plan: NCM will follow for transition of care needs.   Expected Discharge Date:                  Expected Discharge Plan:  [place]  In-House Referral:     Discharge planning Services  CM Consult  Post Acute Care Choice:    Choice offered to:     DME Arranged:    DME Agency:     HH Arranged:    [place] Agency:     Status of Service:  In process, will continue to follow  If discussed at Long Length of Stay Meetings, dates discussed:    Additional Comments:  Eric Mayo, RN 12/11/2017, 9:18 AM

## 2017-12-11 NOTE — Progress Notes (Signed)
ANTICOAGULATION CONSULT NOTE - Initial Consult  Pharmacy Consult for Heparin Indication: atrial fibrillation  Allergies  Allergen Reactions  . Lisinopril Anaphylaxis and Swelling         Patient Measurements: Height: 5\' 6"  (167.6 cm) Weight: 161 lb 9.6 oz (73.3 kg) IBW/kg (Calculated) : 63.8 Heparin Dosing Weight: 73 kg  Vital Signs: Temp: 97.9 F (36.6 C) (08/06 1626) Temp Source: Oral (08/06 1626) BP: 105/79 (08/06 1730) Pulse Rate: 57 (08/06 1730)  Labs: Recent Labs    12/10/17 1038 12/10/17 1940 12/11/17 0232 12/11/17 0735  HGB 14.6  --   --  12.6*  HCT 38.2*  --   --  35.1*  PLT 156  --   --  157  CREATININE 2.84*  --  2.84*  --   TROPONINI 0.68* 0.83* 0.93* 0.78*    Estimated Creatinine Clearance: 18.4 mL/min (A) (by C-G formula based on SCr of 2.84 mg/dL (H)).   Medical History: Past Medical History:  Diagnosis Date  . Allergy   . Arthropathy   . CAD (coronary artery disease)   . Cardiomyopathy ([place])   . Chronic kidney disease   . Colon polyp   . COPD (chronic obstructive pulmonary disease) ([place] [facility] [place])   . Diabetes ([place])   . Diabetic neuropathy ([place])   . DOE (dyspnea on exertion)   . Heart murmur   . Hypercholesterolemia   . Hypertension    pulmonary  . LVH (left ventricular hypertrophy)   . Lymphoma ([place])   . Mitral regurgitation   . Prostate cancer ([place])   . Pulmonary hypertension ([place])   . Tricuspid regurgitation    Assessment:   83 yr old male to begin IV heparin for atrial fibrillation.   Has been on sq heparin for VTE prophylaxis, last dose ~1pm today.  Goal of Therapy:  Heparin level 0.3-0.7 units/ml Monitor platelets by anticoagulation protocol: Yes   Plan:   Stop sq heparin.  Heparin 3000 units IV x 1.  Heparin drip to begin at 1050 units/hr  Heparin level ~8 hrs after drip begins.  Daily heparin level and CBC while on heparin.  Arty Baumgartner, [place] Pager: [phone] 12/11/2017,7:18 PM

## 2017-12-11 NOTE — Plan of Care (Signed)
Discussed with patient and family plan of care for the evening, pain management and getting his heart rate down with some teach back displayed

## 2017-12-11 NOTE — Progress Notes (Signed)
PROGRESS NOTE  Eric Herring  OVZ:[id number] DOB: [birth year] DOA: 12/10/2017 PCP: Shelda Pal, DO   Brief Narrative: Eric Herring is an 83 y.o. male with a history of COPD on home oxygen, intermittent CPAP, chronic HFrEF, pulmonary HTN, stage IV CKD, HTN, hyperlipidemia, prostate CA, lymphoma who presented to [facility] with 4 days of shortness of breath. He was hypoxic on room air, BNP, troponins, and LFTs elevated. CXR suggestive of RML and LLL infiltrates, so antibiotics started. He required BiPAP and was transferred to [facility] SDU. CT chest demonstrated bronchial wall thickening and opacities consistent with multilobar bronchopneumonia versus aspiration pneumonia/pneumonitis.    Assessment & Plan: Active Problems:   Acute on chronic respiratory failure with hypoxia (HCC)  Acute on chronic hypoxic respiratory failure: due to multilobar pneumonia on COPD. RVP negative.  - Continue supplemental oxygen. Aim for SpO2 88-95% to minimize CO2 retention. Required BiPAP as of this AM so will remain in SDU.  - Continue ceftriaxone and azithromycin as well as home bronchodilators (scheduled and prn). Monitor blood and sputum culture.  - Pulmonary toilet TID. - Monitor leukocytosis - Noted to have elevated D-dimer, though CT chest findings strongly suggest alternative primary cause of dyspnea/hypoxia and renal impairment does not allow for angiography. V/Q scan likely to be abnormal due to ventilation defects. Will defer further work up at this time. - Due to possible aspiration, will get SLP evaluation.  Troponin elevation: Due to demand ischemia in setting of respiratory failure and stage IV CKD. ECG showed inverted T waves, RBBB though these don't seem terribly different from ECG 7/16.  - Trending downward (max 0.93) with no chest pain - Could consider limited echo for wall motion - Appreciate cardiology assistance. Followed by Dr. Agustin Cree who mentioned he may need further ischemic work up.    - On ASA, statin  Chronic HFrEF: Based on recent echocardiogram. BNP elevated at 1224. Does not appear volume up, no pulmonary edema noted.  - Appreciate cardiology recommendations.  - Not on ACE/ARB due to CKD, not on beta blocker due to severe COPD. Recently started on hydralazine with plan to possibly add nitrate.  AFib with RVR:  - With LV dysfunction, not an ideal candidate for CCB or BB. May need amiodarone. Defer to cardiology - CHA2DS2-VASc at least 5, so may require anticoagulation. Only option would be coumadin with such a low CrCl.  - Maintain K > 4 and Mg > 2 - Last TSH recently normal  Transaminitis: Improving. UDS negative. - Hep panel pending.  - If continues improvement, could follow up as outpatient.   AKI on stage IV CKD: Cr above putative baseline of 2.14 on admission and stable.  - Hold lasix as above, could restart soon.   QT prolongation: QTc persistently >500 (561msec 8/6) - Avoid provocative agents as able.  T2DM: HbA1c 6.1%.  - SSI AC  DVT prophylaxis: Heparin Code Status: DNR confirmed  Family Communication: None at bedside Disposition Plan: Uncertain.   Consultants:   Cardiology  Procedures:   BiPAP  Antimicrobials:  Ceftriaxone, azithromycin 8/5 >>    Subjective: Breathing a little better this morning but having trouble when taken off BiPAP. Denies fevers, chills, change in sputum character (still thick and white). No chest pain.   Objective: Vitals:   12/11/17 1330 12/11/17 1342 12/11/17 1400 12/11/17 1430  BP: 103/71  104/66 91/70  Pulse: (!) 107 (!) 107 (!) 111 94  Resp:  (!) 24    Temp:  TempSrc:      SpO2: 98% 97% 99% 99%  Weight:      Height:        Intake/Output Summary (Last 24 hours) at 12/11/2017 1453 Last data filed at 12/11/2017 1400 Gross per 24 hour  Intake 604.77 ml  Output 650 ml  Net -45.23 ml   Filed Weights   12/10/17 1819 12/11/17 0300  Weight: 72.8 kg (160 lb 7.9 oz) 73.3 kg (161 lb 9.6 oz)     Gen: Frail, elderly male in no distress  Pulm: Tachypneic with prolonged expiration, scattered rhonchi in lower fields, limited air exchange, no wheezing. CV: Irregular tachycardia. + systolic murmur at apex, no rub, or gallop. No JVD, trace pedal edema. GI: Abdomen soft, non-tender, non-distended, with normoactive bowel sounds. No organomegaly or masses felt. Ext: Warm, no deformities Skin: No rashes, lesions or ulcers Neuro: Alert and oriented. No focal neurological deficits. Psych: Judgement and insight appear normal. Mood & affect appropriate.   Data Reviewed: I have personally reviewed following labs and imaging studies  CBC: Recent Labs  Lab 12/10/17 1038 12/11/17 0735  WBC 8.8 11.8*  NEUTROABS 7.7  --   HGB 14.6 12.6*  HCT 38.2* 35.1*  MCV 73.9* 76.8*  PLT 156 694   Basic Metabolic Panel: Recent Labs  Lab 12/10/17 1038 12/11/17 0232  NA 139 142  K 4.9 4.6  CL 99 102  CO2 26 24  GLUCOSE 176* 167*  BUN 75* 77*  CREATININE 2.84* 2.84*  CALCIUM 9.9 9.5   GFR: Estimated Creatinine Clearance: 18.4 mL/min (A) (by C-G formula based on SCr of 2.84 mg/dL (H)). Liver Function Tests: Recent Labs  Lab 12/10/17 1038 12/11/17 0232  AST 399* 244*  ALT 327* 272*  ALKPHOS 168* 145*  BILITOT 2.8* 2.4*  PROT 8.6* 7.3  ALBUMIN 3.8 3.0*   No results for input(s): LIPASE, AMYLASE in the last 168 hours. No results for input(s): AMMONIA in the last 168 hours. Coagulation Profile: No results for input(s): INR, PROTIME in the last 168 hours. Cardiac Enzymes: Recent Labs  Lab 12/10/17 1038 12/10/17 1940 12/11/17 0232 12/11/17 0735  TROPONINI 0.68* 0.83* 0.93* 0.78*   BNP (last 3 results) Recent Labs    11/29/17 1155  PROBNP 4,831*   HbA1C: Recent Labs    12/10/17 1940  HGBA1C 6.1*   CBG: Recent Labs  Lab 12/10/17 2250 12/11/17 0732 12/11/17 1219  GLUCAP 162* 160* 155*   Lipid Profile: No results for input(s): CHOL, HDL, LDLCALC, TRIG, CHOLHDL,  LDLDIRECT in the last 72 hours. Thyroid Function Tests: No results for input(s): TSH, T4TOTAL, FREET4, T3FREE, THYROIDAB in the last 72 hours. Anemia Panel: No results for input(s): VITAMINB12, FOLATE, FERRITIN, TIBC, IRON, RETICCTPCT in the last 72 hours. Urine analysis: No results found for: COLORURINE, APPEARANCEUR, LABSPEC, [place], GLUCOSEU, [place], [place], [place], [place], [place], NITRITE, LEUKOCYTESUR Recent Results (from the past 240 hour(s))  MRSA PCR Screening     Status: None   Collection Time: 12/10/17  8:52 PM  Result Value Ref Range Status   MRSA by PCR NEGATIVE NEGATIVE Final    Comment:        The GeneXpert MRSA Assay (FDA approved for NASAL specimens only), is one component of a comprehensive MRSA colonization surveillance program. It is not intended to diagnose MRSA infection nor to guide or monitor treatment for MRSA infections. Performed at [facility], [place] [address] [place], [place] [address]   Respiratory Panel by PCR     Status:  None   Collection Time: 12/10/17  8:52 PM  Result Value Ref Range Status   Adenovirus NOT DETECTED NOT DETECTED Final   Coronavirus 229E NOT DETECTED NOT DETECTED Final   Coronavirus HKU1 NOT DETECTED NOT DETECTED Final   Coronavirus NL63 NOT DETECTED NOT DETECTED Final   Coronavirus OC43 NOT DETECTED NOT DETECTED Final   Metapneumovirus NOT DETECTED NOT DETECTED Final   Rhinovirus / Enterovirus NOT DETECTED NOT DETECTED Final   Influenza A NOT DETECTED NOT DETECTED Final   Influenza B NOT DETECTED NOT DETECTED Final   Parainfluenza Virus 1 NOT DETECTED NOT DETECTED Final   Parainfluenza Virus 2 NOT DETECTED NOT DETECTED Final   Parainfluenza Virus 3 NOT DETECTED NOT DETECTED Final   Parainfluenza Virus 4 NOT DETECTED NOT DETECTED Final   Respiratory Syncytial Virus NOT DETECTED NOT DETECTED Final   Bordetella pertussis NOT DETECTED NOT DETECTED Final   Chlamydophila pneumoniae NOT DETECTED NOT DETECTED  Final   Mycoplasma pneumoniae NOT DETECTED NOT DETECTED Final    Comment: Performed at [facility], [place] [address] [place], [place] [address]  Expectorated sputum assessment w rflx to resp cult     Status: None   Collection Time: 12/11/17  6:22 AM  Result Value Ref Range Status   Specimen Description EXPECTORATED SPUTUM  Final   Special Requests NONE  Final   Sputum evaluation   Final    THIS SPECIMEN IS ACCEPTABLE FOR SPUTUM CULTURE Performed at [facility], [place] [address] [place], [place] [address]    Report Status 12/11/2017 FINAL  Final  Culture, respiratory     Status: None (Preliminary result)   Collection Time: 12/11/17  6:22 AM  Result Value Ref Range Status   Specimen Description EXPECTORATED SPUTUM  Final   Special Requests NONE Reflexed from [id number]  Final   Gram Stain   Final    ABUNDANT WBC PRESENT, PREDOMINANTLY PMN FEW GRAM POSITIVE COCCI FEW GRAM POSITIVE RODS Performed at [facility], [place] [address] [place], [place] [address]    Culture PENDING  Incomplete   Report Status PENDING  Incomplete      Radiology Studies: Ct Chest Wo Contrast  Result Date: 12/10/2017 CLINICAL DATA:  83 year old male hypoxic with oxygen saturation 86% on room air. EXAM: CT CHEST WITHOUT CONTRAST TECHNIQUE: Multidetector CT imaging of the chest was performed following the standard protocol without IV contrast. COMPARISON:  Chest CT 11/04/2014. FINDINGS: Cardiovascular: Heart size is borderline enlarged. There is no significant pericardial fluid, thickening or pericardial calcification. There is aortic atherosclerosis, as well as atherosclerosis of the great vessels of the mediastinum and the coronary arteries, including calcified atherosclerotic plaque in the left main, left anterior descending, left circumflex and right coronary arteries. Mediastinum/Nodes: No pathologically enlarged mediastinal or hilar lymph nodes. Please note that accurate exclusion of hilar adenopathy  is limited on noncontrast CT scans. Esophagus is unremarkable in appearance. No axillary lymphadenopathy. Lungs/Pleura: Diffuse bronchial wall thickening with moderate centrilobular and paraseptal emphysema. Scattered areas of mild cylindrical bronchiectasis, most evident in the lower lobes of the lungs bilaterally (left greater than right). Extensive mucoid impaction within the left lower lobe where there is also thickening of the peribronchovascular interstitium as well as peribronchovascular airspace consolidation throughout the left lower lobe, concerning for sequela of recent aspiration and/or developing bronchopneumonia. Irregular areas of airspace consolidation are also noted in a peribronchovascular distribution in the dependent portion of the left upper lobe posteriorly. Right lung appears relatively clear.  No pleural effusions. A few scattered small pulmonary nodules are noted in the lungs bilaterally, largest of which is in the periphery of the right middle lobe (axial image 100 of series 4), unchanged. No larger more suspicious appearing pulmonary nodules or masses are noted. Upper Abdomen: Aortic atherosclerosis. Calcification in the inferior vena cava, similar to the prior examination, likely calcified chronic thrombus. Musculoskeletal: There are no aggressive appearing lytic or blastic lesions noted in the visualized portions of the skeleton. IMPRESSION: 1. Scattered areas of bronchiectasis in the lungs bilaterally, most evident in the lower lobes. In the dependent portions of the left lung there is extensive peribronchovascular airspace consolidation, concerning for sequela of recent aspiration or developing multilobar bronchopneumonia. 2. Diffuse bronchial wall thickening with moderate centrilobular and paraseptal emphysema; imaging findings suggestive of underlying COPD. 3. Multiple small pulmonary nodules measuring 5 mm or less in size, similar to the prior study from 2016, considered definitively  benign. 4. Aortic atherosclerosis, in addition to left main and 3 vessel coronary artery disease. 5. Mild cardiomegaly. 6. Additional incidental findings, similar prior studies, as above. Aortic Atherosclerosis (ICD10-I70.0) and Emphysema (ICD10-J43.9). Electronically Signed   By: Vinnie Langton M.D.   On: 12/10/2017 21:59   Dg Chest Port 1 View  Result Date: 12/10/2017 CLINICAL DATA:  Shortness of breath. EXAM: PORTABLE CHEST 1 VIEW COMPARISON:  Chest x-ray dated 09/20/2017. FINDINGS: Heart size and mediastinal contours are stable. Lungs are hyperexpanded. There are new streaky opacities at the LEFT lung base suggesting pneumonia. No pleural effusion or pneumothorax seen. No acute or suspicious osseous finding. IMPRESSION: 1. New ill-defined/streaky opacities at the LEFT lung base, suspicious for pneumonia. 2. Hyperexpanded lungs indicating COPD. Electronically Signed   By: Franki Cabot M.D.   On: 12/10/2017 11:03    Scheduled Meds: . aspirin  81 mg Oral q1800  . fluticasone furoate-vilanterol  1 puff Inhalation Q2000  . heparin injection (subcutaneous)  5,000 Units Subcutaneous Q8H  . hydrALAZINE  10 mg Oral QID  . insulin aspart  0-9 Units Subcutaneous TID WC  . ipratropium-albuterol  3 mL Nebulization Q6H  . pravastatin  40 mg Oral q1800  . sodium chloride HYPERTONIC  4 mL Nebulization TID   Continuous Infusions: . sodium chloride 10 mL/hr at 12/11/17 0505  . azithromycin 250 mL/hr at 12/11/17 1400  . cefTRIAXone (ROCEPHIN)  IV 1 g (12/11/17 1246)  . diltiazem (CARDIZEM) infusion 10 mg/hr (12/11/17 1042)     LOS: 1 day   Time spent: 35 minutes.  Patrecia Pour, MD [facility] [email or web address] Password [email or web address] 12/11/2017, 2:53 PM

## 2017-12-11 NOTE — Progress Notes (Signed)
Received phone call from [facility] that patients HR was up to 140's. Went into assess patient. Bipap was turned off lying in pts bed. Patient was on room air. Patient states he wears Chronic oxygen at home up to 5L at times. Patients O2 sat noted 92% on RA. Placed patient on 2L [place] HR back down into 110s. Will continue to monitor.

## 2017-12-12 ENCOUNTER — Ambulatory Visit: Payer: Medicare HMO | Admitting: Cardiology

## 2017-12-12 ENCOUNTER — Telehealth: Payer: Self-pay | Admitting: Family Medicine

## 2017-12-12 DIAGNOSIS — I480 Paroxysmal atrial fibrillation: Secondary | ICD-10-CM

## 2017-12-12 DIAGNOSIS — I509 Heart failure, unspecified: Secondary | ICD-10-CM

## 2017-12-12 LAB — CBC
HCT: 33.2 % — ABNORMAL LOW (ref 39.0–52.0)
Hemoglobin: 12.2 g/dL — ABNORMAL LOW (ref 13.0–17.0)
MCH: 27.9 pg (ref 26.0–34.0)
MCHC: 36.7 g/dL — AB (ref 30.0–36.0)
MCV: 76 fL — ABNORMAL LOW (ref 78.0–100.0)
Platelets: 168 10*3/uL (ref 150–400)
RBC: 4.37 MIL/uL (ref 4.22–5.81)
RDW: 14.3 % (ref 11.5–15.5)
WBC: 13.1 10*3/uL — ABNORMAL HIGH (ref 4.0–10.5)

## 2017-12-12 LAB — HEPATITIS PANEL, ACUTE
HCV Ab: <0.1 s/co ratio (ref 0.0–0.9)
HEP B C IGM: NEGATIVE
Hep A IgM: NEGATIVE
Hepatitis B Surface Ag: NEGATIVE

## 2017-12-12 LAB — BASIC METABOLIC PANEL
ANION GAP: 16 — AB (ref 5–15)
BUN: 90 mg/dL — ABNORMAL HIGH (ref 8–23)
CO2: 22 mmol/L (ref 22–32)
Calcium: 9.2 mg/dL (ref 8.9–10.3)
Chloride: 98 mmol/L (ref 98–111)
Creatinine, Ser: 2.57 mg/dL — ABNORMAL HIGH (ref 0.61–1.24)
GFR calc Af Amer: 25 mL/min — ABNORMAL LOW (ref >60)
GFR, EST NON AFRICAN AMERICAN: 22 mL/min — AB (ref >60)
Glucose, Bld: 145 mg/dL — ABNORMAL HIGH (ref 70–99)
POTASSIUM: 4.3 mmol/L (ref 3.5–5.1)
SODIUM: 136 mmol/L (ref 135–145)

## 2017-12-12 LAB — GLUCOSE, CAPILLARY
GLUCOSE-CAPILLARY: 129 mg/dL — AB (ref 70–99)
GLUCOSE-CAPILLARY: 125 mg/dL — AB (ref 70–99)
GLUCOSE-CAPILLARY: 94 mg/dL (ref 70–99)
Glucose-Capillary: 157 mg/dL — ABNORMAL HIGH (ref 70–99)

## 2017-12-12 LAB — HEPARIN LEVEL (UNFRACTIONATED)
HEPARIN UNFRACTIONATED: 0.29 [IU]/mL — AB (ref 0.30–0.70)
Heparin Unfractionated: 0.61 IU/mL (ref 0.30–0.70)

## 2017-12-12 MED ORDER — HYDROCOD POLST-CPM POLST ER 10-8 MG/5ML PO SUER
5.0000 mL | Freq: Once | ORAL | Status: AC
  Administered 2017-12-12: 5 mL via ORAL
  Filled 2017-12-12: qty 5

## 2017-12-12 MED ORDER — HYDROCOD POLST-CPM POLST ER 10-8 MG/5ML PO SUER
5.0000 mL | Freq: Two times a day (BID) | ORAL | Status: DC | PRN
  Administered 2017-12-12 – 2017-12-13 (×2): 5 mL via ORAL
  Filled 2017-12-12 (×2): qty 5

## 2017-12-12 MED ORDER — ZOLPIDEM TARTRATE 5 MG PO TABS: 5.0000 mg | ORAL_TABLET | Freq: Once | ORAL | Status: DC

## 2017-12-12 MED ORDER — ENSURE ENLIVE PO LIQD
237.0000 mL | Freq: Two times a day (BID) | ORAL | Status: DC
  Administered 2017-12-13 – 2017-12-14 (×3): 237 mL via ORAL

## 2017-12-12 NOTE — Progress Notes (Signed)
Progress Note  Patient Name: Eric Herring Date of Encounter: 12/12/2017  Primary Cardiologist: Jenne Campus, MD   Subjective   NICHAEL EHLY is a 83 y.o. male with a hx of dilated cardiomyopathy with EF 30%, COPD on home oxygen, intermittent CPAP, stage IV CKD, DM type II, neuropathy, HLD, hypertension, pulmonary hypertension, MR, prostate cancer and lymphoma who is being seen today for the evaluation of decreased EF, afib and elevated troponin at the request of Dr. Bonner Puna  Admitted with dyspnea Developed AF several day s later Was placed on Dilt drip   Inpatient Medications    Scheduled Meds: . aspirin  81 mg Oral q1800  . fluticasone furoate-vilanterol  1 puff Inhalation Q2000  . hydrALAZINE  10 mg Oral QID  . insulin aspart  0-9 Units Subcutaneous TID WC  . ipratropium-albuterol  3 mL Nebulization Q6H  . sodium chloride HYPERTONIC  4 mL Nebulization TID  . zolpidem  5 mg Oral Once   Continuous Infusions: . sodium chloride 10 mL/hr at 12/11/17 0505  . amiodarone 30 mg/hr (12/12/17 0747)  . azithromycin 250 mL/hr at 12/11/17 1400  . cefTRIAXone (ROCEPHIN)  IV 1 g (12/11/17 1246)  . heparin 1,150 Units/hr (12/12/17 9629)   PRN Meds: sodium chloride, albuterol   Vital Signs    Vitals:   12/12/17 0811 12/12/17 0914 12/12/17 0918 12/12/17 1137  BP: 107/72   109/79  Pulse: 72   85  Resp: 18   20  Temp: (!) 97.4 F (36.3 C)   (!) 97.3 F (36.3 C)  TempSrc: Oral   Oral  SpO2: 100% 100% 100% 100%  Weight:      Height:        Intake/Output Summary (Last 24 hours) at 12/12/2017 1232 Last data filed at 12/12/2017 1145 Gross per 24 hour  Intake 850.77 ml  Output 750 ml  Net 100.77 ml   Filed Weights   12/10/17 1819 12/11/17 0300 12/12/17 0358  Weight: 160 lb 7.9 oz (72.8 kg) 161 lb 9.6 oz (73.3 kg) 161 lb 9.6 oz (73.3 kg)    Telemetry    NSR  - Personally Reviewed  ECG     nsr  - Personally Reviewed  Physical Exam   GEN:  elderly male, appears  chronically ill   ,  Thin,  Neck: No JVD Cardiac: RRR, soft systolic murmjur   Respiratory: bilateral wheezing  GI: Soft, nontender, non-distended  MS: No edema; No deformity. Neuro:  Nonfocal  Psych: Normal affect   Labs    Chemistry Recent Labs  Lab 12/10/17 1038 12/11/17 0232 12/12/17 0334  NA 139 142 136  K 4.9 4.6 4.3  CL 99 102 98  CO2 26 24 22   GLUCOSE 176* 167* 145*  BUN 75* 77* 90*  CREATININE 2.84* 2.84* 2.57*  CALCIUM 9.9 9.5 9.2  PROT 8.6* 7.3  --   ALBUMIN 3.8 3.0*  --   AST 399* 244*  --   ALT 327* 272*  --   ALKPHOS 168* 145*  --   BILITOT 2.8* 2.4*  --   GFRNONAA 19* 19* 22*  GFRAA 22* 22* 25*  ANIONGAP 14 16* 16*     Hematology Recent Labs  Lab 12/10/17 1038 12/11/17 0735 12/12/17 0334  WBC 8.8 11.8* 13.1*  RBC 5.17 4.57 4.37  HGB 14.6 12.6* 12.2*  HCT 38.2* 35.1* 33.2*  MCV 73.9* 76.8* 76.0*  MCH 28.2 27.6 27.9  MCHC 38.2* 35.9 36.7*  RDW 15.1 14.2 14.3  PLT 156 157 168    Cardiac Enzymes Recent Labs  Lab 12/10/17 1038 12/10/17 1940 12/11/17 0232 12/11/17 0735  TROPONINI 0.68* 0.83* 0.93* 0.78*   No results for input(s): TROPIPOC in the last 168 hours.   BNP Recent Labs  Lab 12/10/17 1038  BNP 1,224.9*     DDimer  Recent Labs  Lab 12/10/17 1940  DDIMER 1.86*     Radiology    Ct Chest Wo Contrast  Result Date: 12/10/2017 CLINICAL DATA:  83 year old male hypoxic with oxygen saturation 86% on room air. EXAM: CT CHEST WITHOUT CONTRAST TECHNIQUE: Multidetector CT imaging of the chest was performed following the standard protocol without IV contrast. COMPARISON:  Chest CT 11/04/2014. FINDINGS: Cardiovascular: Heart size is borderline enlarged. There is no significant pericardial fluid, thickening or pericardial calcification. There is aortic atherosclerosis, as well as atherosclerosis of the great vessels of the mediastinum and the coronary arteries, including calcified atherosclerotic plaque in the left main, left anterior  descending, left circumflex and right coronary arteries. Mediastinum/Nodes: No pathologically enlarged mediastinal or hilar lymph nodes. Please note that accurate exclusion of hilar adenopathy is limited on noncontrast CT scans. Esophagus is unremarkable in appearance. No axillary lymphadenopathy. Lungs/Pleura: Diffuse bronchial wall thickening with moderate centrilobular and paraseptal emphysema. Scattered areas of mild cylindrical bronchiectasis, most evident in the lower lobes of the lungs bilaterally (left greater than right). Extensive mucoid impaction within the left lower lobe where there is also thickening of the peribronchovascular interstitium as well as peribronchovascular airspace consolidation throughout the left lower lobe, concerning for sequela of recent aspiration and/or developing bronchopneumonia. Irregular areas of airspace consolidation are also noted in a peribronchovascular distribution in the dependent portion of the left upper lobe posteriorly. Right lung appears relatively clear. No pleural effusions. A few scattered small pulmonary nodules are noted in the lungs bilaterally, largest of which is in the periphery of the right middle lobe (axial image 100 of series 4), unchanged. No larger more suspicious appearing pulmonary nodules or masses are noted. Upper Abdomen: Aortic atherosclerosis. Calcification in the inferior vena cava, similar to the prior examination, likely calcified chronic thrombus. Musculoskeletal: There are no aggressive appearing lytic or blastic lesions noted in the visualized portions of the skeleton. IMPRESSION: 1. Scattered areas of bronchiectasis in the lungs bilaterally, most evident in the lower lobes. In the dependent portions of the left lung there is extensive peribronchovascular airspace consolidation, concerning for sequela of recent aspiration or developing multilobar bronchopneumonia. 2. Diffuse bronchial wall thickening with moderate centrilobular and  paraseptal emphysema; imaging findings suggestive of underlying COPD. 3. Multiple small pulmonary nodules measuring 5 mm or less in size, similar to the prior study from 2016, considered definitively benign. 4. Aortic atherosclerosis, in addition to left main and 3 vessel coronary artery disease. 5. Mild cardiomegaly. 6. Additional incidental findings, similar prior studies, as above. Aortic Atherosclerosis (ICD10-I70.0) and Emphysema (ICD10-J43.9). Electronically Signed   By: Vinnie Langton M.D.   On: 12/10/2017 21:59    Cardiac Studies      Patient Profile     83 y.o. male with acute on chronic combined CHF, COPD, PAF   Assessment & Plan    1.   A/C combined CHF:    Perhaps slightly better.    I do not think he is volume overloaded.  May be a bit dry  Better in NSR . Is allergic to Lisinopril ( also has acute renal insufficiency which precludes use of ACE or ARB) on hydralazine Likely would not  tolerate beta blockers - actively wheezing and getting beta agonist nebs.  Continue amio  Not a good candidate for cath   2. PAF :   Agree with IV amiodarone , continue heparin Will need oral anticoagulation.   Consider Eliquis 2.5 mg BID - would appreciate a pharmacy consult to help with this choice        For questions or updates, please contact [place] Please consult [email or web address] for contact info under Cardiology/STEMI.      Signed, Mertie Moores, MD  12/12/2017, 12:32 PM

## 2017-12-12 NOTE — Progress Notes (Signed)
[place] for Heparin Indication: atrial fibrillation  Allergies  Allergen Reactions  . Lisinopril Anaphylaxis and Swelling         Patient Measurements: Height: 5\' 6"  (167.6 cm) Weight: 161 lb 9.6 oz (73.3 kg) IBW/kg (Calculated) : 63.8 Heparin Dosing Weight: 73 kg  Vital Signs: Temp: 97.3 F (36.3 C) (08/07 1137) Temp Source: Oral (08/07 1137) BP: 109/52 (08/07 1400) Pulse Rate: 80 (08/07 1400)  Labs: Recent Labs    12/10/17 1038 12/10/17 1940 12/11/17 0232 12/11/17 0735 12/12/17 0334 12/12/17 1359  HGB 14.6  --   --  12.6* 12.2*  --   HCT 38.2*  --   --  35.1* 33.2*  --   PLT 156  --   --  157 168  --   HEPARINUNFRC  --   --   --   --  0.29* 0.61  CREATININE 2.84*  --  2.84*  --  2.57*  --   TROPONINI 0.68* 0.83* 0.93* 0.78*  --   --     Estimated Creatinine Clearance: 20.3 mL/min (A) (by C-G formula based on SCr of 2.57 mg/dL (H)).   Medical History: Past Medical History:  Diagnosis Date  . Allergy   . Arthropathy   . CAD (coronary artery disease)   . Cardiomyopathy ([place])   . Chronic kidney disease   . Colon polyp   . COPD (chronic obstructive pulmonary disease) ([place])   . Diabetes ([place])   . Diabetic neuropathy ([place])   . DOE (dyspnea on exertion)   . Heart murmur   . Hypercholesterolemia   . Hypertension    pulmonary  . LVH (left ventricular hypertrophy)   . Lymphoma ([place])   . Mitral regurgitation   . Prostate cancer ([place])   . Pulmonary hypertension ([place])   . Tricuspid regurgitation    Assessment: 83 yr old male to begin IV heparin for atrial fibrillation. Initial heparin level slightly low at 0.29. CBC stable. No bleed or IV line issues per discussion with RN. Heparin level came back therapeutic again.  Goal of Therapy:  Heparin level 0.3-0.7 units/ml Monitor platelets by anticoagulation protocol: Yes   Plan:  Continue heparin to 1150 units/hr Monitor daily heparin level and CBC, s/sx  bleeding  Onnie Boer, PharmD, Jake Samples, AAHIVP, CPP Infectious Disease Pharmacist Pager: [phone] 12/12/2017 3:23 PM

## 2017-12-12 NOTE — Evaluation (Signed)
Physical Therapy Evaluation Patient Details Name: Eric Herring MRN: [id number] DOB: [birth year] Today's Date: 12/12/2017   History of Present Illness  83 y.o. male with a history of COPD on home oxygen, intermittent CPAP, chronic HFrEF, pulmonary HTN, stage IV CKD, HTN, hyperlipidemia, prostate CA, lymphoma who presented to [facility] with 4 days of shortness of breath. He was hypoxic on room air, BNP, troponins, and LFTs elevated. CXR suggestive of RML and LLL infiltrates, CT chest demonstrated bronchial wall thickening and opacities consistent with multilobar bronchopneumonia versus aspiration pneumonia/pneumonitis  Clinical Impression  PTA pt independent in all aspects of mobility and ADLs, drives and takes care of his sick wife. Pt currently limited in his safe mobility by oxygen desaturation and decreased knowledge of energy conservation. Pt requires mod I for bed mobility, and supervision for transfers and limited ambulation in room. Pt does have some difficulty with rising from a low surface and states sometimes he has difficulty getting up from toilet. PT will continue to follow pt acutely to progress mobility and to continue education on energy conservation to maintain QoL. No follow up PT is anticipated at d/c, however pt would benefit from 3 in 1 placed over his toilet to improve safety with sit<>stand.     Follow Up Recommendations No PT follow up;Supervision - Intermittent    Equipment Recommendations  3in1 (PT)    Recommendations for Other Services       Precautions / Restrictions Precautions Precautions: Fall Precaution Comments: watch O2 sats Restrictions Weight Bearing Restrictions: No      Mobility  Bed Mobility Overal bed mobility: Modified Independent             General bed mobility comments: HoB raised, utilized bedrails  Transfers Overall transfer level: Needs assistance Equipment used: None Transfers: Sit to/from Stand Sit to Stand: Supervision          General transfer comment: supervision for safety, required 2 attempts to power up from lower bed surface  Ambulation/Gait Ambulation/Gait assistance: Supervision Gait Distance (Feet): 10 Feet Assistive device: None Gait Pattern/deviations: Step-through pattern;Decreased stride length Gait velocity: slowed   General Gait Details: only able to ambulate around bed due to tethered telemetry, supervision for safety, shortened stride length, however strong steady gait        Balance Overall balance assessment: Needs assistance Sitting-balance support: Feet supported Sitting balance-Leahy Scale: Normal     Standing balance support: No upper extremity supported;During functional activity Standing balance-Leahy Scale: Normal       Tandem Stance - Right Leg: 20 Tandem Stance - Left Leg: 30 [place] - Eyes Opened: 30 [place] - Eyes Closed: 10                 Pertinent Vitals/Pain Pain Assessment: No/denies pain    Home Living Family/patient expects to be discharged to:: Private residence Living Arrangements: Spouse/significant other Available Help at Discharge: Family;Available PRN/intermittently Type of Home: House Home Access: Stairs to enter Entrance Stairs-Rails: None Entrance Stairs-Number of Steps: 3 Home Layout: One level Home Equipment: Walker - 2 wheels;Shower seat;Other (comment)(equipment is wife's )      Prior Function Level of Independence: Independent         Comments: enjoys yardwork and fishing     Hand Dominance   Dominant Hand: Right    Extremity/Trunk Assessment   Upper Extremity Assessment Upper Extremity Assessment: Overall WFL for tasks assessed    Lower Extremity Assessment Lower Extremity Assessment: Defer to PT evaluation    Cervical /  Trunk Assessment Cervical / Trunk Assessment: Normal  Communication   Communication: No difficulties  Cognition Arousal/Alertness: Awake/alert Behavior During Therapy: WFL for tasks  assessed/performed Overall Cognitive Status: Within Functional Limits for tasks assessed                                        General Comments General comments (skin integrity, edema, etc.): at rest BP 106/78, SaO2 on 4L O2 via [place] 100%O2, with activity SaO2 on 4L O2 dropped to 86%O2, HR max 105 bpm, with sitting in recliner and vc for pursed lipped breathing SaO2 rebounded to 98%O2, BP 139/76, Daughter present throughout sessions, educated on energy conservation and limiting time outside during heat of day         Assessment/Plan    PT Assessment Patient needs continued PT services  PT Problem List Decreased activity tolerance;Decreased safety awareness;Cardiopulmonary status limiting activity       PT Treatment Interventions DME instruction;Gait training;Stair training;Functional mobility training;Therapeutic activities;Therapeutic exercise;Balance training;Cognitive remediation;Patient/family education    PT Goals (Current goals can be found in the Care Plan section)  Acute Rehab PT Goals Patient Stated Goal: go home (Simultaneous filing. User may not have seen previous data.) PT Goal Formulation: With patient Time For Goal Achievement: 12/26/17 Potential to Achieve Goals: Good    Frequency Min 3X/week    AM-PAC PT "6 Clicks" Daily Activity  Outcome Measure Difficulty turning over in bed (including adjusting bedclothes, sheets and blankets)?: None Difficulty moving from lying on back to sitting on the side of the bed? : None Difficulty sitting down on and standing up from a chair with arms (e.g., wheelchair, bedside commode, etc,.)?: None Help needed moving to and from a bed to chair (including a wheelchair)?: None Help needed walking in hospital room?: None Help needed climbing 3-5 steps with a railing? : None 6 Click Score: 24    End of Session Equipment Utilized During Treatment: Gait belt;Oxygen Activity Tolerance: Patient tolerated treatment  well Patient left: with call bell/phone within reach;in chair;with family/visitor present Nurse Communication: Mobility status PT Visit Diagnosis: Difficulty in walking, not elsewhere classified (R26.2)    Time: 5597-4163 PT Time Calculation (min) (ACUTE ONLY): 41 min   Charges:   PT Evaluation $PT Eval Moderate Complexity: 1 Mod PT Treatments $Gait Training: 8-22 mins $Therapeutic Activity: 8-22 mins        Hilaria Titsworth B. Migdalia Dk PT, DPT Acute Rehabilitation  [phone] Pager [phone]    [place] 12/12/2017, 12:37 PM

## 2017-12-12 NOTE — Telephone Encounter (Signed)
Copied from [place] [id number]. Topic: Quick Communication - See Telephone Encounter >> Dec 12, 2017 11:45 AM Ahmed Prima L wrote: CRM for notification. See Telephone encounter for: 12/12/17.  [employer] called to follow up on a fax they sent on 7/29 for clarification on albuterol (PROVENTIL) (2.5 MG/3ML) 0.083% nebulizer solution. Please contact [employer] @ [phone] reference number [id number]

## 2017-12-12 NOTE — Progress Notes (Signed)
BIPAP on stand-by at bedside- Pt currently on 3lpm and tolerating well.

## 2017-12-12 NOTE — Telephone Encounter (Signed)
updated

## 2017-12-12 NOTE — Progress Notes (Addendum)
Initial Nutrition Assessment  DOCUMENTATION CODES:   Not applicable  INTERVENTION:    Ensure Enlive po BID, each supplement provides 350 kcal and 20 grams of protein  NUTRITION DIAGNOSIS:   Increased nutrient needs related to acute illness as evidenced by estimated needs  GOAL:   Patient will meet greater than or equal to 90% of their needs  MONITOR:   PO intake, Supplement acceptance, Labs, Weight trends, Skin, I & O's  REASON FOR ASSESSMENT:   Malnutrition Screening Tool  ASSESSMENT:   83 y.o. Male with a history of COPD on home O2, intermittent CPAP, pulmonary HTN, stage IV CKD, HTN, prostate CA, lymphoma who presented with 4 days of shortness of breath; chest X-ray showed LLL PNA.  RD spoke with pt's daughter at bedside. Daughter reports pt has been eating poorly due to a decreased appetite. Pt's wife (daugher's Stepmom) has recently been sick and pt has gone "downhill".  Pt does not typically drink nutrition supplements but is amenable to receiving here. Daughter states pt's face and upper body look smaller and "he hasn't been moving much". Daughter is unable to identify amount or time frame for suspected weight loss.  S/p bedside swallow evaluation today. Mild aspiration risk. Labs and medications reviewed. CBG's [id number].  NUTRITION - FOCUSED PHYSICAL EXAM:    Most Recent Value  Orbital Region  No depletion  Upper Arm Region  Mild depletion  Thoracic and Lumbar Region  Unable to assess  Buccal Region  Mild depletion  Temple Region  Mild depletion  Clavicle Bone Region  Mild depletion  Clavicle and Acromion Bone Region  Mild depletion  Scapular Bone Region  Unable to assess  Dorsal Hand  Unable to assess  Patellar Region  Unable to assess  Anterior Thigh Region  Unable to assess  Posterior Calf Region  Unable to assess  Edema (RD Assessment)  None    Diet Order:   Diet Order           Diet heart healthy/carb modified Room service appropriate?  Yes; Fluid consistency: Thin  Diet effective now         EDUCATION NEEDS:   No education needs have been identified at this time  Skin:  Skin Assessment: Reviewed RN Assessment  Last BM:  8/5  Height:   Ht Readings from Last 1 Encounters:  12/10/17 5\' 6"  (1.676 m)   Weight:   Wt Readings from Last 1 Encounters:  12/12/17 161 lb 9.6 oz (73.3 kg)   BMI:  Body mass index is 26.08 kg/m.  Estimated Nutritional Needs:   Kcal:  1700-1900  Protein:  80-95 gm  Fluid:  1.7-1.9 L  Arthur Holms, RD, LDN Pager #: [phone] After-Hours Pager #: [phone]

## 2017-12-12 NOTE — Progress Notes (Signed)
SLP Cancellation Note  Patient Details Name: [name] MRN: [id number] DOB: 07-12-1934   Cancelled treatment:       Reason Eval/Treat Not Completed: Other (comment) Pt working with PT at the moment. Will f/u as able.   Germain Osgood 12/12/2017, 9:51 AM  Germain Osgood, M.A. CCC-SLP [phone]

## 2017-12-12 NOTE — Progress Notes (Signed)
Pharmacist Heart Failure Core Measure Documentation  Assessment: Eric Herring has an EF documented as 30-35% on 11/01/17 by ECHO.  Rationale: Heart failure patients with left ventricular systolic dysfunction (LVSD) and an EF < 40% should be prescribed an angiotensin converting enzyme inhibitor (ACEI) or angiotensin receptor blocker (ARB) at discharge unless a contraindication is documented in the medical record.  This patient is not currently on an ACEI or ARB for HF.  This note is being placed in the record in order to provide documentation that a contraindication to the use of these agents is present for this encounter.  ACE Inhibitor or Angiotensin Receptor Blocker is contraindicated (specify all that apply)  []   ACEI allergy AND ARB allergy []   Angioedema []   Moderate or severe aortic stenosis []   Hyperkalemia []   Hypotension []   Renal artery stenosis [x]   Worsening renal function, preexisting renal disease or dysfunction   Minh Pham 12/12/2017 1:44 PM

## 2017-12-12 NOTE — Progress Notes (Signed)
PROGRESS NOTE  Eric Herring  RJJ:[id number] DOB: 11-Mar-1935 DOA: 12/10/2017 PCP: Shelda Pal, DO   Brief Narrative: Eric Herring is an 83 y.o. male with a history of COPD on home oxygen, intermittent CPAP, chronic HFrEF, pulmonary HTN, stage IV CKD, HTN, hyperlipidemia, prostate CA, lymphoma who presented to [facility] with 4 days of shortness of breath. He was hypoxic on room air, BNP, troponins, and LFTs elevated. CXR suggestive of RML and LLL infiltrates, so antibiotics started. He required BiPAP and was transferred to [facility] SDU. CT chest demonstrated bronchial wall thickening and opacities consistent with multilobar bronchopneumonia versus aspiration pneumonia/pneumonitis.   Assessment & Plan: Active Problems:   COPD (chronic obstructive pulmonary disease) (HCC)   Bronchiectasis (HCC)   Type 2 diabetes mellitus with neurological complications (HCC)   Dilated cardiomyopathy (HCC)   Pulmonary hypertension, unspecified (HCC)   Acute on chronic respiratory failure with hypoxia (HCC)   Paroxysmal atrial fibrillation (HCC)  Acute on chronic hypoxic respiratory failure: due to multilobar pneumonia on COPD. RVP negative.  - Continue supplemental oxygen. Aim for SpO2 88-95% to minimize CO2 retention. Will do ambulatory pulse oximetry prior to discharge. - Continue ceftriaxone and azithromycin as well as home bronchodilators (scheduled and prn). Monitor blood and sputum culture.  - Pulmonary toilet TID. - Monitor leukocytosis - Noted to have elevated D-dimer, though CT chest findings strongly suggest alternative primary cause of dyspnea/hypoxia and renal impairment does not allow for angiography. V/Q scan likely to be abnormal due to ventilation defects. Will defer further work up at this time. - Due to possible aspiration: SLP evaluation determined the patient is at mild aspiration risk.  Troponin elevation: Due to demand ischemia in setting of respiratory failure and stage IV CKD. ECG  showed inverted T waves, RBBB though these don't seem terribly different from ECG 7/16.  - Trending downward (max 0.93) with no chest pain. Appreciate cardiology assistance, no plans for ischemic evaluation. Followed by Dr. Agustin Cree. - On heparin gtt as below, will hold statin with transaminitis.  Chronic HFrEF: Based on recent echocardiogram. BNP elevated at 1224. Does not appear volume up, no pulmonary edema noted.  - Appreciate cardiology recommendations.  - Not on ACE/ARB due to CKD, not on beta blocker due to severe COPD. Recently started on hydralazine with plan to possibly add nitrate. Will add hold parameters to avoid hypotension.   Paroxysmal AFib with RVR:  - With LV dysfunction, not an ideal candidate for CCB, cardiology started amiodarone, will likely transition to po soon, back in sinus rhythm. - CHA2DS2-VASc at least 5, so may require anticoagulation, could consider renal dose eliquis vs. coumadin. - Maintain K > 4 and Mg > 2 - Last TSH recently normal  Transaminitis: Improving. UDS negative. - Hep panel negative.  - Recommend holding statin for now and follow up as outpatient.   AKI on stage IV CKD: Cr above putative baseline of 2.14 on admission and stable.  - Hold lasix as above, improving.  - Avoid hypotension and nephrotoxins.  QT prolongation: QTc persistently >500 (526msec 8/6) - Avoid provocative agents as able.  T2DM: HbA1c 6.1%.  - SSI AC  DVT prophylaxis: Heparin gtt Code Status: DNR confirmed  Family Communication: None at bedside Disposition Plan: Home once improved.  Consultants:   Cardiology  Procedures:   BiPAP  Antimicrobials:  Ceftriaxone, azithromycin 8/5 >>    Subjective: Feels much better today, no dyspnea at rest currently, but was short of breath worse than baseline when getting up  with therapy. Sputum has become darker. Kept in a cup which I saw, there was no apparent blood.  Objective: Vitals:   12/12/17 1400 12/12/17 1443  12/12/17 1500 12/12/17 1600  BP: (!) 109/52  (!) 88/62 98/70  Pulse: 80  97 80  Resp:      Temp:    (!) 96.4 F (35.8 C)  TempSrc:    Oral  SpO2: 100% 98% 100% 98%  Weight:      Height:        Intake/Output Summary (Last 24 hours) at 12/12/2017 1658 Last data filed at 12/12/2017 1500 Gross per 24 hour  Intake 1348.79 ml  Output 1050 ml  Net 298.79 ml   Filed Weights   12/10/17 1819 12/11/17 0300 12/12/17 0358  Weight: 72.8 kg (160 lb 7.9 oz) 73.3 kg (161 lb 9.6 oz) 73.3 kg (161 lb 9.6 oz)   Gen: 83 y.o. male in no distress Pulm: Nonlabored tachypnea. Diminished without wheezing or crackles. CV: Regular rate and rhythm. +apical murmur, rub, or gallop. No JVD, trace dependent edema. GI: Abdomen soft, non-tender, non-distended, with normoactive bowel sounds.  Ext: Warm, no deformities Skin: No rashes, lesions or ulcers on visualized skin.  Neuro: Alert and oriented. No focal neurological deficits. Psych: Judgement and insight appear fair. Mood euthymic & affect congruent. Behavior is appropriate.     Data Reviewed: I have personally reviewed following labs and imaging studies  CBC: Recent Labs  Lab 12/10/17 1038 12/11/17 0735 12/12/17 0334  WBC 8.8 11.8* 13.1*  NEUTROABS 7.7  --   --   HGB 14.6 12.6* 12.2*  HCT 38.2* 35.1* 33.2*  MCV 73.9* 76.8* 76.0*  PLT 156 157 409   Basic Metabolic Panel: Recent Labs  Lab 12/10/17 1038 12/11/17 0232 12/12/17 0334  NA 139 142 136  K 4.9 4.6 4.3  CL 99 102 98  CO2 26 24 22   GLUCOSE 176* 167* 145*  BUN 75* 77* 90*  CREATININE 2.84* 2.84* 2.57*  CALCIUM 9.9 9.5 9.2   GFR: Estimated Creatinine Clearance: 20.3 mL/min (A) (by C-G formula based on SCr of 2.57 mg/dL (H)). Liver Function Tests: Recent Labs  Lab 12/10/17 1038 12/11/17 0232  AST 399* 244*  ALT 327* 272*  ALKPHOS 168* 145*  BILITOT 2.8* 2.4*  PROT 8.6* 7.3  ALBUMIN 3.8 3.0*   No results for input(s): LIPASE, AMYLASE in the last 168 hours. No results for  input(s): AMMONIA in the last 168 hours. Coagulation Profile: No results for input(s): INR, PROTIME in the last 168 hours. Cardiac Enzymes: Recent Labs  Lab 12/10/17 1038 12/10/17 1940 12/11/17 0232 12/11/17 0735  TROPONINI 0.68* 0.83* 0.93* 0.78*   BNP (last 3 results) Recent Labs    11/29/17 1155  PROBNP 4,831*   HbA1C: Recent Labs    12/10/17 1940  HGBA1C 6.1*   CBG: Recent Labs  Lab 12/11/17 1624 12/11/17 2154 12/12/17 0810 12/12/17 1138 12/12/17 1651  GLUCAP 148* 143* 129* 125* 157*   Lipid Profile: No results for input(s): CHOL, HDL, LDLCALC, TRIG, CHOLHDL, LDLDIRECT in the last 72 hours. Thyroid Function Tests: No results for input(s): TSH, T4TOTAL, FREET4, T3FREE, THYROIDAB in the last 72 hours. Anemia Panel: No results for input(s): VITAMINB12, FOLATE, FERRITIN, TIBC, IRON, RETICCTPCT in the last 72 hours. Urine analysis: No results found for: COLORURINE, APPEARANCEUR, LABSPEC, [place], GLUCOSEU, HGBUR, BILIRUBINUR, KETONESUR, PROTEINUR, UROBILINOGEN, NITRITE, LEUKOCYTESUR Recent Results (from the past 240 hour(s))  MRSA PCR Screening     Status: None   Collection  Time: 12/10/17  8:52 PM  Result Value Ref Range Status   MRSA by PCR NEGATIVE NEGATIVE Final    Comment:        The GeneXpert MRSA Assay (FDA approved for NASAL specimens only), is one component of a comprehensive MRSA colonization surveillance program. It is not intended to diagnose MRSA infection nor to guide or monitor treatment for MRSA infections. Performed at [facility], [place] [address], [place], [place] [address]   Respiratory Panel by PCR     Status: None   Collection Time: 12/10/17  8:52 PM  Result Value Ref Range Status   Adenovirus NOT DETECTED NOT DETECTED Final   Coronavirus 229E NOT DETECTED NOT DETECTED Final   Coronavirus HKU1 NOT DETECTED NOT DETECTED Final   Coronavirus NL63 NOT DETECTED NOT DETECTED Final   Coronavirus OC43 NOT DETECTED NOT DETECTED Final     Metapneumovirus NOT DETECTED NOT DETECTED Final   Rhinovirus / Enterovirus NOT DETECTED NOT DETECTED Final   Influenza A NOT DETECTED NOT DETECTED Final   Influenza B NOT DETECTED NOT DETECTED Final   Parainfluenza Virus 1 NOT DETECTED NOT DETECTED Final   Parainfluenza Virus 2 NOT DETECTED NOT DETECTED Final   Parainfluenza Virus 3 NOT DETECTED NOT DETECTED Final   Parainfluenza Virus 4 NOT DETECTED NOT DETECTED Final   Respiratory Syncytial Virus NOT DETECTED NOT DETECTED Final   Bordetella pertussis NOT DETECTED NOT DETECTED Final   Chlamydophila pneumoniae NOT DETECTED NOT DETECTED Final   Mycoplasma pneumoniae NOT DETECTED NOT DETECTED Final    Comment: Performed at [facility], [place] [address], [place], [place] [address]  Expectorated sputum assessment w rflx to resp cult     Status: None   Collection Time: 12/11/17  6:22 AM  Result Value Ref Range Status   Specimen Description EXPECTORATED SPUTUM  Final   Special Requests NONE  Final   Sputum evaluation   Final    THIS SPECIMEN IS ACCEPTABLE FOR SPUTUM CULTURE Performed at [facility], [place] [address] [place], [place] [address]    Report Status 12/11/2017 FINAL  Final  Culture, respiratory     Status: None (Preliminary result)   Collection Time: 12/11/17  6:22 AM  Result Value Ref Range Status   Specimen Description EXPECTORATED SPUTUM  Final   Special Requests NONE Reflexed from [id number]  Final   Gram Stain   Final    ABUNDANT WBC PRESENT, PREDOMINANTLY PMN FEW GRAM POSITIVE COCCI FEW GRAM POSITIVE RODS    Culture   Final    CULTURE REINCUBATED FOR BETTER GROWTH Performed at [facility], [place] [address], [place], [place] [address]    Report Status PENDING  Incomplete      Radiology Studies: Ct Chest Wo Contrast  Result Date: 12/10/2017 CLINICAL DATA:  83 year old male hypoxic with oxygen saturation 86% on room air. EXAM: CT CHEST WITHOUT CONTRAST TECHNIQUE: Multidetector CT imaging of the chest was  performed following the standard protocol without IV contrast. COMPARISON:  Chest CT 11/04/2014. FINDINGS: Cardiovascular: Heart size is borderline enlarged. There is no significant pericardial fluid, thickening or pericardial calcification. There is aortic atherosclerosis, as well as atherosclerosis of the great vessels of the mediastinum and the coronary arteries, including calcified atherosclerotic plaque in the left main, left anterior descending, left circumflex and right coronary arteries. Mediastinum/Nodes: No pathologically enlarged mediastinal or hilar lymph nodes. Please note that accurate exclusion of hilar adenopathy is limited on noncontrast CT scans. Esophagus is  unremarkable in appearance. No axillary lymphadenopathy. Lungs/Pleura: Diffuse bronchial wall thickening with moderate centrilobular and paraseptal emphysema. Scattered areas of mild cylindrical bronchiectasis, most evident in the lower lobes of the lungs bilaterally (left greater than right). Extensive mucoid impaction within the left lower lobe where there is also thickening of the peribronchovascular interstitium as well as peribronchovascular airspace consolidation throughout the left lower lobe, concerning for sequela of recent aspiration and/or developing bronchopneumonia. Irregular areas of airspace consolidation are also noted in a peribronchovascular distribution in the dependent portion of the left upper lobe posteriorly. Right lung appears relatively clear. No pleural effusions. A few scattered small pulmonary nodules are noted in the lungs bilaterally, largest of which is in the periphery of the right middle lobe (axial image 100 of series 4), unchanged. No larger more suspicious appearing pulmonary nodules or masses are noted. Upper Abdomen: Aortic atherosclerosis. Calcification in the inferior vena cava, similar to the prior examination, likely calcified chronic thrombus. Musculoskeletal: There are no aggressive appearing lytic  or blastic lesions noted in the visualized portions of the skeleton. IMPRESSION: 1. Scattered areas of bronchiectasis in the lungs bilaterally, most evident in the lower lobes. In the dependent portions of the left lung there is extensive peribronchovascular airspace consolidation, concerning for sequela of recent aspiration or developing multilobar bronchopneumonia. 2. Diffuse bronchial wall thickening with moderate centrilobular and paraseptal emphysema; imaging findings suggestive of underlying COPD. 3. Multiple small pulmonary nodules measuring 5 mm or less in size, similar to the prior study from 2016, considered definitively benign. 4. Aortic atherosclerosis, in addition to left main and 3 vessel coronary artery disease. 5. Mild cardiomegaly. 6. Additional incidental findings, similar prior studies, as above. Aortic Atherosclerosis (ICD10-I70.0) and Emphysema (ICD10-J43.9). Electronically Signed   By: Vinnie Langton M.D.   On: 12/10/2017 21:59    Scheduled Meds: . aspirin  81 mg Oral q1800  . fluticasone furoate-vilanterol  1 puff Inhalation Q2000  . hydrALAZINE  10 mg Oral QID  . insulin aspart  0-9 Units Subcutaneous TID WC  . ipratropium-albuterol  3 mL Nebulization Q6H  . sodium chloride HYPERTONIC  4 mL Nebulization TID  . zolpidem  5 mg Oral Once   Continuous Infusions: . sodium chloride 10 mL/hr at 12/11/17 0505  . amiodarone 30 mg/hr (12/12/17 0747)  . azithromycin 500 mg (12/12/17 1416)  . cefTRIAXone (ROCEPHIN)  IV Stopped (12/12/17 1315)  . heparin 1,150 Units/hr (12/12/17 1604)     LOS: 2 days   Time spent: 35 minutes.  Patrecia Pour, MD [facility] [email or web address] Password [facility] 12/12/2017, 4:58 PM

## 2017-12-12 NOTE — Progress Notes (Signed)
Occupational Therapy Evaluation Patient Details Name: Eric Herring MRN: [id number] DOB: 1934-12-21 Today's Date: 12/12/2017    History of Present Illness 83 y.o. male with a history of COPD on home oxygen, intermittent CPAP, chronic HFrEF, pulmonary HTN, stage IV CKD, HTN, hyperlipidemia, prostate CA, lymphoma who presented to [facility] with 4 days of shortness of breath. He was hypoxic on room air, BNP, troponins, and LFTs elevated. CXR suggestive of RML and LLL infiltrates, CT chest demonstrated bronchial wall thickening and opacities consistent with multilobar bronchopneumonia versus aspiration pneumonia/pneumonitis   Clinical Impression   PTA, pt independent with ADL and mobility and did not use home O2. Pt currently able to complete ADL and mobility with increased WOB with 2/4 dyspnea with O2 sats remaining @ 100% on 4L. Pt expressed his desire to continue to due meaningful tasks, I.e. yardwork and fishing while managing his "breathing". Began education on energy conservation. Will follow acutely to facilitate safe DC home and maximize independence with occupational/leisure tasks. Pt appreciative.     Follow Up Recommendations  No OT follow up;Supervision - Intermittent    Equipment Recommendations  3 in 1 bedside commode    Recommendations for Other Services       Precautions / Restrictions Precautions Precautions: Fall Precaution Comments: watch O2 sats Restrictions Weight Bearing Restrictions: No      Mobility Bed Mobility Overal bed mobility: OOB in chair               Transfers Overall transfer level: Needs assistance Equipment used: None Transfers: Sit to/from Stand Sit to Stand: Supervision             Balance    not formally assessed. WFL for ADL assessed                                       ADL either performed or assessed with clinical judgement   ADL Overall ADL's : Needs assistance/impaired                                      Functional mobility during ADLs: Supervision/safety General ADL Comments: Pt able to complete bathing/dressing/grooming @ S level. Note increased WOB on 4L [place] which pt states is not his norm to get SOB as easily as he does at this time. States at baseline he is ableto walk around a "[employer] store", get what he needs and get out. States this is fatiguing for him but he is able to do it.      Vision Baseline Vision/History: Wears glasses       Perception     Praxis      Pertinent Vitals/Pain Pain Assessment: No/denies pain     Hand Dominance Right   Extremity/Trunk Assessment Upper Extremity Assessment Upper Extremity Assessment: Overall WFL for tasks assessed   Lower Extremity Assessment Lower Extremity Assessment: Defer to PT evaluation   Cervical / Trunk Assessment Cervical / Trunk Assessment: Normal   Communication Communication Communication: No difficulties   Cognition Arousal/Alertness: Awake/alert Behavior During Therapy: WFL for tasks assessed/performed Overall Cognitive Status: Within Functional Limits for tasks assessed                                     General  Comments  Began education on energy conservation. Pt discussed his desire to continue with meaningful tasks, I.e. yardworka dn fishing because it brings him "joy"  Exercises     Shoulder Instructions      Home Living Family/patient expects to be discharged to:: Private residence Living Arrangements: Spouse/significant other Available Help at Discharge: Family;Available PRN/intermittently Type of Home: House Home Access: Stairs to enter [employer] of Steps: 3 Entrance Stairs-Rails: None Home Layout: One level     Bathroom Shower/Tub: Tub/shower unit;Door   [employer] Toilet: Standard Bathroom Accessibility: Yes How Accessible: Accessible via walker Home Equipment: Walker - 2 wheels;Shower seat;Other (comment)(equipment is wife's )           Prior Functioning/Environment Level of Independence: Independent        Comments: enjoys yardwork and fishing        OT Problem List: Decreased activity tolerance;Decreased knowledge of use of DME or AE;Cardiopulmonary status limiting activity      OT Treatment/Interventions: Self-care/ADL training;DME and/or AE instruction;Therapeutic activities;Patient/family education    OT Goals(Current goals can be found in the care plan section) Acute Rehab OT Goals Patient Stated Goal: to go home OT Goal Formulation: With patient Time For Goal Achievement: 12/26/17 Potential to Achieve Goals: Good  OT Frequency: Min 2X/week   Barriers to D/C:            Co-evaluation              AM-PAC PT "6 Clicks" Daily Activity     Outcome Measure Help from another person eating meals?: None Help from another person taking care of personal grooming?: None Help from another person toileting, which includes using toliet, bedpan, or urinal?: A Little Help from another person bathing (including washing, rinsing, drying)?: None Help from another person to put on and taking off regular upper body clothing?: None Help from another person to put on and taking off regular lower body clothing?: None 6 Click Score: 23   End of Session Equipment Utilized During Treatment: Oxygen(4L) Nurse Communication: Mobility status  Activity Tolerance: Patient tolerated treatment well Patient left: in chair;with call bell/phone within reach;with chair alarm set;with family/visitor present  OT Visit Diagnosis: Muscle weakness (generalized) (M62.81)                Time: 2841-3244 OT Time Calculation (min): 20 min Charges:  OT General Charges $OT Visit: 1 Visit OT Evaluation $OT Eval Moderate Complexity: [place], OT/L  OT Clinical Specialist [phone]   [facility] 12/12/2017, 12:10 PM

## 2017-12-12 NOTE — Evaluation (Signed)
Clinical/Bedside Swallow Evaluation Patient Details  Name: Eric Herring MRN: [id number] Date of Birth: 1935/04/18  Today's Date: 12/12/2017 Time: SLP Start Time (ACUTE ONLY): 6222 SLP Stop Time (ACUTE ONLY): 1441 SLP Time Calculation (min) (ACUTE ONLY): 19 min  Past Medical History:  Past Medical History:  Diagnosis Date  . Allergy   . Arthropathy   . CAD (coronary artery disease)   . Cardiomyopathy ([place])   . Chronic kidney disease   . Colon polyp   . COPD (chronic obstructive pulmonary disease) ([place])   . Diabetes ([place])   . Diabetic neuropathy ([place])   . DOE (dyspnea on exertion)   . Heart murmur   . Hypercholesterolemia   . Hypertension    pulmonary  . LVH (left ventricular hypertrophy)   . Lymphoma ([place])   . Mitral regurgitation   . Prostate cancer ([place])   . Pulmonary hypertension ([place])   . Tricuspid regurgitation    Past Surgical History:  Past Surgical History:  Procedure Laterality Date  . HEMORROIDECTOMY     pt does not remember year   HPI:  Pt is an 83 yo male admitted with acute on chronic hypoxic respiratory failure, initially requiring BiPAP. CXR concerning for LLL PNA. CT Chest concerning for recent aspiration or developing multilobar bronchopneumonia. PMH: prostate cancer, lymphoma, HTN, DOE, DM, COPD (not on  home O2), CKD, CAD, pulmonary HTN, HLD   Assessment / Plan / Recommendation Clinical Impression  Pt has frequent coughing at baseline, making clinical assessment of oropharyngeal swallow difficult. Initially he denies any difficulties swallowing at baseline, although upon further discussion he does say that he has some coughing after meals. He denies any h/o PNA or esophageal issues; swallow appears to occur swiftly. We discussed the option of pursuing MBS to get a clearer look at his swallowing function given risk factors for aspiration, but at this time he would like to hold off on further testing. He is agreeable for additional SLP f/u at bedside as his  overall coughing subsides to see if he clinical assessment can be better completed. Recommend continuing regular diet and thin liquids with use of general aspiration precautions for now. SLP Visit Diagnosis: Dysphagia, unspecified (R13.10)    Aspiration Risk  Mild aspiration risk    Diet Recommendation Regular;Thin liquid   Liquid Administration via: Cup;Straw Medication Administration: Whole meds with liquid Supervision: Patient able to self feed;Intermittent supervision to cue for compensatory strategies Compensations: Slow rate;Small sips/bites Postural Changes: Seated upright at 90 degrees    Other  Recommendations Oral Care Recommendations: Oral care BID   Follow up Recommendations None      Frequency and Duration min 2x/week  1 week       Prognosis        Swallow Study   General HPI: Pt is an 83 yo male admitted with acute on chronic hypoxic respiratory failure, initially requiring BiPAP. CXR concerning for LLL PNA. CT Chest concerning for recent aspiration or developing multilobar bronchopneumonia. PMH: prostate cancer, lymphoma, HTN, DOE, DM, COPD (not on  home O2), CKD, CAD, pulmonary HTN, HLD Type of Study: Bedside Swallow Evaluation Previous Swallow Assessment: none in chart Diet Prior to this Study: Regular;Thin liquids Temperature Spikes Noted: No Respiratory Status: Nasal cannula History of Recent Intubation: No Behavior/Cognition: Alert;Cooperative;Pleasant mood Oral Cavity Assessment: Within Functional Limits Oral Care Completed by SLP: No Oral Cavity - Dentition: Dentures, not available;Edentulous Vision: Functional for self-feeding Self-Feeding Abilities: Able to feed self Patient Positioning: Upright in chair Baseline Vocal  Quality: Hoarse(mild) Volitional Cough: Strong;Congested Volitional Swallow: Able to elicit    Oral/Motor/Sensory Function Overall Oral Motor/Sensory Function: Within functional limits   Ice Chips Ice chips: Not tested   Thin  Liquid Thin Liquid: Impaired Presentation: Cup;Self Fed;Straw Pharyngeal  Phase Impairments: Cough - Delayed    Nectar Thick Nectar Thick Liquid: Not tested   Honey Thick Honey Thick Liquid: Not tested   Puree Puree: Impaired Presentation: Self Fed;Spoon Pharyngeal Phase Impairments: Cough - Delayed   Solid     Solid: Impaired Presentation: Self Fed Pharyngeal Phase Impairments: Cough - Delayed      Germain Osgood 12/12/2017,3:01 PM  Germain Osgood, M.A. CCC-SLP [phone]

## 2017-12-12 NOTE — Progress Notes (Signed)
[place] for Heparin Indication: atrial fibrillation  Allergies  Allergen Reactions  . Lisinopril Anaphylaxis and Swelling         Patient Measurements: Height: 5\' 6"  (167.6 cm) Weight: 161 lb 9.6 oz (73.3 kg) IBW/kg (Calculated) : 63.8 Heparin Dosing Weight: 73 kg  Vital Signs: Temp: 97.6 F (36.4 C) (08/07 0358) Temp Source: Oral (08/07 0358) BP: 102/71 (08/07 0358)  Labs: Recent Labs    12/10/17 1038 12/10/17 1940 12/11/17 0232 12/11/17 0735 12/12/17 0334  HGB 14.6  --   --  12.6* 12.2*  HCT 38.2*  --   --  35.1* 33.2*  PLT 156  --   --  157 168  HEPARINUNFRC  --   --   --   --  0.29*  CREATININE 2.84*  --  2.84*  --   --   TROPONINI 0.68* 0.83* 0.93* 0.78*  --     Estimated Creatinine Clearance: 18.4 mL/min (A) (by C-G formula based on SCr of 2.84 mg/dL (H)).   Medical History: Past Medical History:  Diagnosis Date  . Allergy   . Arthropathy   . CAD (coronary artery disease)   . Cardiomyopathy ([place])   . Chronic kidney disease   . Colon polyp   . COPD (chronic obstructive pulmonary disease) ([place])   . Diabetes ([place])   . Diabetic neuropathy ([place])   . DOE (dyspnea on exertion)   . Heart murmur   . Hypercholesterolemia   . Hypertension    pulmonary  . LVH (left ventricular hypertrophy)   . Lymphoma ([place])   . Mitral regurgitation   . Prostate cancer ([place])   . Pulmonary hypertension ([place])   . Tricuspid regurgitation    Assessment: 83 yr old male to begin IV heparin for atrial fibrillation. Initial heparin level slightly low at 0.29. CBC stable. No bleed or IV line issues per discussion with RN.  Goal of Therapy:  Heparin level 0.3-0.7 units/ml Monitor platelets by anticoagulation protocol: Yes   Plan:  Increase heparin to 1150 units/hr 8h heparin level Monitor daily heparin level and CBC, s/sx bleeding  Elicia Lamp, PharmD, BCPS Clinical Pharmacist 12/12/2017 5:41 AM

## 2017-12-13 DIAGNOSIS — I5043 Acute on chronic combined systolic (congestive) and diastolic (congestive) heart failure: Secondary | ICD-10-CM

## 2017-12-13 LAB — CBC
HCT: 33.6 % — ABNORMAL LOW (ref 39.0–52.0)
HEMOGLOBIN: 12.4 g/dL — AB (ref 13.0–17.0)
MCH: 27.9 pg (ref 26.0–34.0)
MCHC: 36.9 g/dL — AB (ref 30.0–36.0)
MCV: 75.7 fL — AB (ref 78.0–100.0)
PLATELETS: 190 10*3/uL (ref 150–400)
RBC: 4.44 MIL/uL (ref 4.22–5.81)
RDW: 14.1 % (ref 11.5–15.5)
WBC: 12.4 10*3/uL — ABNORMAL HIGH (ref 4.0–10.5)

## 2017-12-13 LAB — BASIC METABOLIC PANEL
Anion gap: 15 (ref 5–15)
BUN: 94 mg/dL — ABNORMAL HIGH (ref 8–23)
CALCIUM: 9.5 mg/dL (ref 8.9–10.3)
CO2: 23 mmol/L (ref 22–32)
CREATININE: 2.68 mg/dL — AB (ref 0.61–1.24)
Chloride: 100 mmol/L (ref 98–111)
GFR calc non Af Amer: 21 mL/min — ABNORMAL LOW (ref >60)
GFR, EST AFRICAN AMERICAN: 24 mL/min — AB (ref >60)
Glucose, Bld: 100 mg/dL — ABNORMAL HIGH (ref 70–99)
Potassium: 4.5 mmol/L (ref 3.5–5.1)
Sodium: 138 mmol/L (ref 135–145)

## 2017-12-13 LAB — HEPARIN LEVEL (UNFRACTIONATED): HEPARIN UNFRACTIONATED: 0.63 [IU]/mL (ref 0.30–0.70)

## 2017-12-13 LAB — CULTURE, RESPIRATORY W GRAM STAIN

## 2017-12-13 LAB — GLUCOSE, CAPILLARY
Glucose-Capillary: 86 mg/dL (ref 70–99)
Glucose-Capillary: 127 mg/dL — ABNORMAL HIGH (ref 70–99)
Glucose-Capillary: 113 mg/dL — ABNORMAL HIGH (ref 70–99)
Glucose-Capillary: 94 mg/dL (ref 70–99)

## 2017-12-13 LAB — CULTURE, RESPIRATORY: CULTURE: NORMAL

## 2017-12-13 MED ORDER — APIXABAN 2.5 MG PO TABS
2.5000 mg | ORAL_TABLET | Freq: Two times a day (BID) | ORAL | Status: DC
  Administered 2017-12-13 – 2017-12-14 (×3): 2.5 mg via ORAL
  Filled 2017-12-13 (×3): qty 1

## 2017-12-13 MED ORDER — AMIODARONE HCL 200 MG PO TABS
200.0000 mg | ORAL_TABLET | Freq: Every day | ORAL | Status: DC
  Administered 2017-12-13: 200 mg via ORAL
  Filled 2017-12-13: qty 1

## 2017-12-13 MED ORDER — AMOXICILLIN-POT CLAVULANATE 875-125 MG PO TABS: 1.0000 | ORAL_TABLET | Freq: Two times a day (BID) | ORAL | 0 refills | Status: AC

## 2017-12-13 MED ORDER — HYDRALAZINE HCL 10 MG PO TABS
10.0000 mg | ORAL_TABLET | Freq: Three times a day (TID) | ORAL | Status: DC
  Administered 2017-12-13 – 2017-12-14 (×3): 10 mg via ORAL
  Filled 2017-12-13 (×3): qty 1

## 2017-12-13 MED ORDER — APIXABAN 2.5 MG PO TABS: 2.5000 mg | ORAL_TABLET | Freq: Two times a day (BID) | ORAL | 0 refills | Status: DC

## 2017-12-13 MED ORDER — AMIODARONE HCL 200 MG PO TABS: 200.0000 mg | ORAL_TABLET | Freq: Every day | ORAL | 0 refills | Status: DC

## 2017-12-13 MED ORDER — AZITHROMYCIN 250 MG PO TABS: 250.0000 mg | ORAL_TABLET | Freq: Every day | ORAL | 0 refills | Status: DC

## 2017-12-13 NOTE — NC FL2 (Signed)
[place] LEVEL OF CARE SCREENING TOOL     IDENTIFICATION  Patient Name: Eric Herring Birthdate: 08-13-1934 Sex: male Admission Date (Current Location): 12/10/2017  [facility] and [place] Number:  Herbalist and Address:  [facility], [place] [address] [place], [place] [address]      Provider Number: [id number]  Attending Physician Name and Address:  Patrecia Pour, MD  Relative Name and Phone Number:       Current Level of Care: Hospital Recommended Level of Care: [place] Prior Approval Number:    Date Approved/Denied:   PASRR Number: [id number]  Discharge Plan: SNF    Current Diagnoses: Patient Active Problem List   Diagnosis Date Noted  . Acute on chronic combined systolic and diastolic CHF (congestive heart failure) ([place])   . Paroxysmal atrial fibrillation (HCC)   . Acute on chronic renal insufficiency 12/11/2017  . Bilateral lower extremity edema 12/11/2017  . Systolic congestive heart failure ([place]) 12/11/2017  . Dyspnea and respiratory abnormalities 12/10/2017  . Acute on chronic respiratory failure with hypoxia ([place]) 12/10/2017  . Dilated cardiomyopathy ([place]) 11/20/2017  . Pulmonary hypertension, unspecified ([place]) 11/20/2017  . Mitral regurgitation 11/20/2017  . Congestive heart failure ([place]) 11/20/2017  . Arthritis pain, hand 09/07/2017  . Type 2 diabetes mellitus with neurological complications ([place]) 93/79/0240  . Chronic respiratory failure ([place]) 06/22/2016  . Tachycardia 11/04/2014  . Edema 10/08/2014  . COPD (chronic obstructive pulmonary disease) ([place]) 07/30/2012  . Bronchiectasis ([place]) 07/30/2012    Orientation RESPIRATION BLADDER Height & Weight     Self, Place  O2(4L [place]) Continent Weight: 166 lb 14.2 oz (75.7 kg) Height:  5\' 6"  (167.6 cm)  BEHAVIORAL SYMPTOMS/MOOD NEUROLOGICAL BOWEL NUTRITION STATUS      Continent Diet(carb modified)  AMBULATORY STATUS COMMUNICATION OF NEEDS Skin    Limited Assist Verbally Normal                       Personal Care Assistance Level of Assistance  Bathing, Dressing Bathing Assistance: Limited assistance   Dressing Assistance: Limited assistance     Functional Limitations Info             SPECIAL CARE FACTORS FREQUENCY  PT (By licensed PT), OT (By licensed OT)     PT Frequency: 5/wk OT Frequency: 5/wk            Contractures      Additional Factors Info  Code Status, Allergies Code Status Info: DNR Allergies Info: Lisinopril           Current Medications (12/13/2017):  This is the current hospital active medication list Current Facility-Administered Medications  Medication Dose Route Frequency Provider Last Rate Last Dose  . 0.9 %  sodium chloride infusion   Intravenous PRN Blanchie Dessert, MD 10 mL/hr at 12/13/17 1310 250 mL at 12/13/17 1310  . albuterol (PROVENTIL) (2.5 MG/3ML) 0.083% nebulizer solution 2.5 mg  2.5 mg Nebulization Q3H PRN Patrecia Pour, MD      . amiodarone (PACERONE) tablet 200 mg  200 mg Oral Daily Nahser, Wonda Cheng, MD      . apixaban Arne Cleveland) tablet 2.5 mg  2.5 mg Oral BID Vance Gather B, MD   2.5 mg at 12/13/17 1019  . aspirin chewable tablet 81 mg  81 mg Oral q1800 Irene Pap N, DO   81 mg at 12/12/17 1705  . azithromycin (ZITHROMAX) 500 mg in sodium chloride 0.9 %  250 mL IVPB  500 mg Intravenous Q24H Irene Pap N, DO 250 mL/hr at 12/13/17 1412 500 mg at 12/13/17 1412  . cefTRIAXone (ROCEPHIN) 1 g in sodium chloride 0.9 % 100 mL IVPB  1 g Intravenous Q24H Hall, Carole N, DO 200 mL/hr at 12/13/17 1311 1 g at 12/13/17 1311  . chlorpheniramine-HYDROcodone (TUSSIONEX) 10-8 MG/5ML suspension 5 mL  5 mL Oral Q12H PRN Patrecia Pour, MD   5 mL at 12/12/17 2225  . feeding supplement (ENSURE ENLIVE) (ENSURE ENLIVE) liquid 237 mL  237 mL Oral BID BM Patrecia Pour, MD   237 mL at 12/13/17 1014  . fluticasone furoate-vilanterol (BREO ELLIPTA) 100-25 MCG/INH 1 puff  1 puff Inhalation Q2000  Irene Pap N, DO   1 puff at 12/12/17 2151  . hydrALAZINE (APRESOLINE) tablet 10 mg  10 mg Oral Q8H Barrett, Rhonda G, PA-C      . insulin aspart (novoLOG) injection 0-9 Units  0-9 Units Subcutaneous TID WC Irene Pap N, DO   1 Units at 12/13/17 1302  . ipratropium-albuterol (DUONEB) 0.5-2.5 (3) MG/3ML nebulizer solution 3 mL  3 mL Nebulization Q6H Hall, Carole N, DO   3 mL at 12/13/17 1359  . zolpidem (AMBIEN) tablet 5 mg  5 mg Oral Once Schorr, Rhetta Mura, NP         Discharge Medications: Please see discharge summary for a list of discharge medications.  Relevant Imaging Results:  Relevant Lab Results:   Additional Information SS#: [id number]  Jorge Ny, LCSW

## 2017-12-13 NOTE — Care Management Important Message (Signed)
Important Message  Patient Details  Name: Eric Herring MRN: [id number] Date of Birth: [birth year]   Medicare Important Message Given:  Yes    Tanza Pellot 12/13/2017, 1:40 PM

## 2017-12-13 NOTE — Progress Notes (Signed)
Physical Therapy Treatment Patient Details Name: Eric Herring MRN: [id number] DOB: March 01, 1935 Today's Date: 12/13/2017    History of Present Illness 83 y.o. male with a history of COPD on home oxygen, intermittent CPAP, chronic HFrEF, pulmonary HTN, stage IV CKD, HTN, hyperlipidemia, prostate CA, lymphoma who presented to [facility] with 4 days of shortness of breath. He was hypoxic on room air, BNP, troponins, and LFTs elevated. CXR suggestive of RML and LLL infiltrates, CT chest demonstrated bronchial wall thickening and opacities consistent with multilobar bronchopneumonia versus aspiration pneumonia/pneumonitis    PT Comments    Pt reports being quite independent at his baseline, no need for O2, took care of his wife (does all household tasks); During PT session today pt is extremely dyspneic, he is able to walk ~ 15', desats to 81% on 3L with HR 110,  desats to 71% sitting EOB on RA; pt is unable to continue amb, he is able to recover to 90s with standing rest and pursed lip breathing; pt dtr is very concerned about him going home at current status; pt is not agreeable to SNF at this time, would benefit from  [place]  At d/c if not SNF; PT will continue to follow;    Follow Up Recommendations  Home health PT;SNF(vs--pending progress)     Equipment Recommendations  3in1 (PT)    Recommendations for Other Services       Precautions / Restrictions Precautions Precautions: Fall Precaution Comments: monitor sats Restrictions Weight Bearing Restrictions: No    Mobility  Bed Mobility Overal bed mobility: Modified Independent             General bed mobility comments: HOB elevated  Transfers Overall transfer level: Needs assistance Equipment used: None Transfers: Sit to/from Stand Sit to Stand: Supervision;Min guard         General transfer comment: for safety, incr time needed d/t dyspnea, fatigued from sitting up; SpO2= 71% on RA with sitting  EOB  Ambulation/Gait Ambulation/Gait assistance: Min guard Gait Distance (Feet): (10' more) Assistive device: None;IV Pole Gait Pattern/deviations: Step-through pattern;Decreased stride length;Drifts right/left     General Gait Details: distance limited by dyspnea and decr sats; pt desats to 81% on 3L, he is able to recover with pursed lip breathing and  incr time/standing break, unable to continue (d/t dyspnea 3-4/4), HR max 110   Stairs             Wheelchair Mobility    Modified Rankin (Stroke Patients Only)       Balance   Sitting-balance support: Feet supported Sitting balance-Leahy Scale: Normal     Standing balance support: No upper extremity supported;During functional activity Standing balance-Leahy Scale: Good(does have LOB with mod challenges )                              Cognition Arousal/Alertness: Awake/alert Behavior During Therapy: WFL for tasks assessed/performed Overall Cognitive Status: Within Functional Limits for tasks assessed                                        Exercises      General Comments        Pertinent Vitals/Pain Pain Assessment: No/denies pain    Home Living  Prior Function            PT Goals (current goals can now be found in the care plan section) Acute Rehab PT Goals Patient Stated Goal: go home  PT Goal Formulation: With patient Time For Goal Achievement: 12/26/17 Potential to Achieve Goals: Good Progress towards PT goals: Progressing toward goals    Frequency    Min 3X/week      PT Plan Current plan remains appropriate    Co-evaluation              AM-PAC PT "6 Clicks" Daily Activity  Outcome Measure  Difficulty turning over in bed (including adjusting bedclothes, sheets and blankets)?: None Difficulty moving from lying on back to sitting on the side of the bed? : None Difficulty sitting down on and standing up from a chair with  arms (e.g., wheelchair, bedside commode, etc,.)?: A Little Help needed moving to and from a bed to chair (including a wheelchair)?: A Little Help needed walking in hospital room?: A Little Help needed climbing 3-5 steps with a railing? : A Little 6 Click Score: 20    End of Session Equipment Utilized During Treatment: Gait belt;Oxygen Activity Tolerance: Patient limited by fatigue;Other (comment)(dyspnea) Patient left: in bed;with call bell/phone within reach;with family/visitor present   PT Visit Diagnosis: Difficulty in walking, not elsewhere classified (R26.2)     Time: 8144-8185 PT Time Calculation (min) (ACUTE ONLY): 53 min  Charges:  $Gait Training: 23-37 mins $Self Care/Home Management: 23-37                     Kenyon Ana, PT Pager: [phone] 12/13/2017    Kenyon Ana 12/13/2017, 3:21 PM

## 2017-12-13 NOTE — Progress Notes (Signed)
PROGRESS NOTE  Eric Herring  OZY:[id number] DOB: [birth year] DOA: 12/10/2017 PCP: Shelda Pal, DO   Brief Narrative: Eric Herring is an 83 y.o. male with a history of COPD on home oxygen, intermittent CPAP, chronic HFrEF, pulmonary HTN, stage IV CKD, HTN, hyperlipidemia, prostate CA, lymphoma who presented to [facility] with 4 days of shortness of breath. He was hypoxic on room air, BNP, troponins, and LFTs elevated. CXR suggestive of RML and LLL infiltrates, so antibiotics started. He required BiPAP and was transferred to [facility] SDU. CT chest demonstrated bronchial wall thickening and opacities consistent with multilobar bronchopneumonia versus aspiration pneumonia/pneumonitis. He was admitted with IV antibiotics and quickly weaned from BiPAP to supplemental oxygen. Developed atrial fibrillation for which heparin was started and amiodarone infusion started with conversion to NSR and now having some PVCs but rate well controlled. Plan is to convert to low dose eliquis and po amiodarone with cardiology and pulmonology follow up.   Assessment & Plan: Active Problems:   COPD (chronic obstructive pulmonary disease) (HCC)   Bronchiectasis (HCC)   Type 2 diabetes mellitus with neurological complications (HCC)   Dilated cardiomyopathy (HCC)   Pulmonary hypertension, unspecified (HCC)   Acute on chronic respiratory failure with hypoxia (HCC)   Paroxysmal atrial fibrillation (HCC)   Acute on chronic combined systolic and diastolic CHF (congestive heart failure) (HCC)  Acute on chronic hypoxic respiratory failure: due to multilobar pneumonia on COPD. RVP negative.  - Continue supplemental oxygen. Aim for SpO2 88-95% to minimize CO2 retention, discussed with RN. - Continue ceftriaxone and azithromycin for now as well as home bronchodilators (scheduled and prn). Monitor blood and sputum culture.  - Pulmonary toilet TID. - No indication for steroids at this time. - Monitor leukocytosis in AM -  Noted to have elevated D-dimer, though CT chest findings strongly suggest alternative primary cause of dyspnea/hypoxia and renal impairment does not allow for angiography. V/Q scan likely to be abnormal due to ventilation defects. Will defer further work up at this time. In any case, the patient has been put on anticoagulation. - Due to possible aspiration: SLP evaluation determined the patient is at mild aspiration risk.  Troponin elevation: Due to demand ischemia in setting of respiratory failure and stage IV CKD. ECG showed inverted T waves, RBBB though these don't seem terribly different from ECG 7/16. No CP. - Trending downward (max 0.93) with no chest pain. Appreciate cardiology assistance, no plans for ischemic evaluation. Followed by Dr. Agustin Cree. - On eliquis now, hold statin with transaminitis.  Chronic HFrEF: Based on recent echocardiogram. BNP elevated at 1224. Does not appear volume up, no pulmonary edema noted.  - Appreciate cardiology recommendations.  - Not on ACE/ARB due to CKD, not on beta blocker due to severe COPD. Recently started on hydralazine with plan to possibly add nitrate. Will add hold parameters to avoid hypotension.   Paroxysmal AFib with RVR: Back in AFib, rate controlled. BP would not support BB. - With LV dysfunction, not an ideal candidate for CCB, cardiology started amiodarone, will likely transition to po today. - CHA2DS2-VASc at least 5: Starting reduced dose eliquis. Pharmacy education has been provided. - Maintain K > 4 and Mg > 2 - Last TSH recently normal  Transaminitis: Improving. UDS negative. - Hep panel negative.  - Recommend holding statin for now and follow up as outpatient.   AKI on stage IV CKD: Cr above putative baseline of 2.14 on admission and stable.  - Hold lasix as above - Avoid  hypotension and nephrotoxins.  QT prolongation: QTc persistently >500 (533msec 8/6) - Avoid provocative agents as able.  T2DM: HbA1c 6.1%.  - SSI AC  DVT  prophylaxis: Eliquis Code Status: DNR confirmed  Family Communication: Daughter at bedside Disposition Plan: SNF when bed available. Medically ready for discharge.  Consultants:   Cardiology  Procedures:   BiPAP  Antimicrobials:  Ceftriaxone, azithromycin 8/5 >>    Subjective: Feels much better. Breathing better, able to walk around with limited dyspnea. No chest pain or bleeding. Per family, RN, cardiology MD, and PT, the patient was very short of breath with ambulation today. Pt really wants to go home to care for his frail wife, though he agrees that he is not in good enough shape to take care of her while he's this dyspneic/weak.  Objective: Vitals:   12/13/17 1212 12/13/17 1300 12/13/17 1400 12/13/17 1401  BP: 93/66 121/88 113/78   Pulse:  86 88   Resp: 18     Temp: 97.6 F (36.4 C)     TempSrc: Oral     SpO2:   96% 90%  Weight:      Height:        Intake/Output Summary (Last 24 hours) at 12/13/2017 1611 Last data filed at 12/13/2017 1100 Gross per 24 hour  Intake 282 ml  Output 875 ml  Net -593 ml   Filed Weights   12/11/17 0300 12/12/17 0358 12/13/17 0600  Weight: 73.3 kg 73.3 kg 75.7 kg   Gen: 83 y.o. male in no distress Pulm: Nonlabored tachypnea. Diminished without wheezing or crackles. CV: Regular rate and rhythm. +apical murmur, rub, or gallop. No JVD, trace dependent edema. GI: Abdomen soft, non-tender, non-distended, with normoactive bowel sounds.  Ext: Warm, no deformities Skin: No rashes, lesions or ulcers on visualized skin.  Neuro: Alert and oriented. No focal neurological deficits. Psych: Judgement and insight appear fair. Mood euthymic & affect congruent. Behavior is appropriate.     Data Reviewed: I have personally reviewed following labs and imaging studies  CBC: Recent Labs  Lab 12/10/17 1038 12/11/17 0735 12/12/17 0334 12/13/17 0408  WBC 8.8 11.8* 13.1* 12.4*  NEUTROABS 7.7  --   --   --   HGB 14.6 12.6* 12.2* 12.4*  HCT 38.2*  35.1* 33.2* 33.6*  MCV 73.9* 76.8* 76.0* 75.7*  PLT 156 157 168 119   Basic Metabolic Panel: Recent Labs  Lab 12/10/17 1038 12/11/17 0232 12/12/17 0334 12/13/17 0408  NA 139 142 136 138  K 4.9 4.6 4.3 4.5  CL 99 102 98 100  CO2 26 24 22 23   GLUCOSE 176* 167* 145* 100*  BUN 75* 77* 90* 94*  CREATININE 2.84* 2.84* 2.57* 2.68*  CALCIUM 9.9 9.5 9.2 9.5   GFR: Estimated Creatinine Clearance: 19.5 mL/min (A) (by C-G formula based on SCr of 2.68 mg/dL (H)). Liver Function Tests: Recent Labs  Lab 12/10/17 1038 12/11/17 0232  AST 399* 244*  ALT 327* 272*  ALKPHOS 168* 145*  BILITOT 2.8* 2.4*  PROT 8.6* 7.3  ALBUMIN 3.8 3.0*   No results for input(s): LIPASE, AMYLASE in the last 168 hours. No results for input(s): AMMONIA in the last 168 hours. Coagulation Profile: No results for input(s): INR, PROTIME in the last 168 hours. Cardiac Enzymes: Recent Labs  Lab 12/10/17 1038 12/10/17 1940 12/11/17 0232 12/11/17 0735  TROPONINI 0.68* 0.83* 0.93* 0.78*   BNP (last 3 results) Recent Labs    11/29/17 1155  PROBNP 4,831*   HbA1C: Recent Labs  12/10/17 1940  HGBA1C 6.1*   CBG: Recent Labs  Lab 12/12/17 1138 12/12/17 1651 12/12/17 2140 12/13/17 0813 12/13/17 1241  GLUCAP 125* 157* 94 86 127*   Lipid Profile: No results for input(s): CHOL, HDL, LDLCALC, TRIG, CHOLHDL, LDLDIRECT in the last 72 hours. Thyroid Function Tests: No results for input(s): TSH, T4TOTAL, FREET4, T3FREE, THYROIDAB in the last 72 hours. Anemia Panel: No results for input(s): VITAMINB12, FOLATE, FERRITIN, TIBC, IRON, RETICCTPCT in the last 72 hours. Urine analysis: No results found for: COLORURINE, APPEARANCEUR, LABSPEC, [place], GLUCOSEU, [place], [place], [place], [place], [place], NITRITE, LEUKOCYTESUR Recent Results (from the past 240 hour(s))  MRSA PCR Screening     Status: None   Collection Time: 12/10/17  8:52 PM  Result Value Ref Range Status   MRSA by PCR NEGATIVE  NEGATIVE Final    Comment:        The GeneXpert MRSA Assay (FDA approved for NASAL specimens only), is one component of a comprehensive MRSA colonization surveillance program. It is not intended to diagnose MRSA infection nor to guide or monitor treatment for MRSA infections. Performed at [facility], [place] [address], [place], [place] [address]   Respiratory Panel by PCR     Status: None   Collection Time: 12/10/17  8:52 PM  Result Value Ref Range Status   Adenovirus NOT DETECTED NOT DETECTED Final   Coronavirus 229E NOT DETECTED NOT DETECTED Final   Coronavirus HKU1 NOT DETECTED NOT DETECTED Final   Coronavirus NL63 NOT DETECTED NOT DETECTED Final   Coronavirus OC43 NOT DETECTED NOT DETECTED Final   Metapneumovirus NOT DETECTED NOT DETECTED Final   Rhinovirus / Enterovirus NOT DETECTED NOT DETECTED Final   Influenza A NOT DETECTED NOT DETECTED Final   Influenza B NOT DETECTED NOT DETECTED Final   Parainfluenza Virus 1 NOT DETECTED NOT DETECTED Final   Parainfluenza Virus 2 NOT DETECTED NOT DETECTED Final   Parainfluenza Virus 3 NOT DETECTED NOT DETECTED Final   Parainfluenza Virus 4 NOT DETECTED NOT DETECTED Final   Respiratory Syncytial Virus NOT DETECTED NOT DETECTED Final   Bordetella pertussis NOT DETECTED NOT DETECTED Final   Chlamydophila pneumoniae NOT DETECTED NOT DETECTED Final   Mycoplasma pneumoniae NOT DETECTED NOT DETECTED Final    Comment: Performed at [facility], [place] [address], [place], [place] [address]  Expectorated sputum assessment w rflx to resp cult     Status: None   Collection Time: 12/11/17  6:22 AM  Result Value Ref Range Status   Specimen Description EXPECTORATED SPUTUM  Final   Special Requests NONE  Final   Sputum evaluation   Final    THIS SPECIMEN IS ACCEPTABLE FOR SPUTUM CULTURE Performed at [facility], [place] [address], [place], [place] [address]    Report Status 12/11/2017 FINAL  Final  Culture, respiratory     Status:  None   Collection Time: 12/11/17  6:22 AM  Result Value Ref Range Status   Specimen Description EXPECTORATED SPUTUM  Final   Special Requests NONE Reflexed from [id number]  Final   Gram Stain   Final    ABUNDANT WBC PRESENT, PREDOMINANTLY PMN FEW GRAM POSITIVE COCCI FEW GRAM POSITIVE RODS    Culture   Final    Consistent with normal respiratory flora. Performed at [facility], [place] [address], [place],  [address]    Report Status 12/13/2017 FINAL  Final      Radiology Studies: No results found.  Scheduled Meds: .  amiodarone  200 mg Oral Daily  . apixaban  2.5 mg Oral BID  . aspirin  81 mg Oral q1800  . feeding supplement (ENSURE ENLIVE)  237 mL Oral BID BM  . fluticasone furoate-vilanterol  1 puff Inhalation Q2000  . hydrALAZINE  10 mg Oral Q8H  . insulin aspart  0-9 Units Subcutaneous TID WC  . ipratropium-albuterol  3 mL Nebulization Q6H  . zolpidem  5 mg Oral Once   Continuous Infusions: . sodium chloride 250 mL (12/13/17 1310)  . azithromycin 500 mg (12/13/17 1412)  . cefTRIAXone (ROCEPHIN)  IV 1 g (12/13/17 1311)     LOS: 3 days   Time spent: 35 minutes.  Patrecia Pour, MD [facility] [email or web address] Password [email or web address] 12/13/2017, 4:11 PM

## 2017-12-13 NOTE — Progress Notes (Signed)
Pt resting comfortably on 3lpm- no BIPAP needed at this time.

## 2017-12-13 NOTE — Care Management (Signed)
12-13-17  BENEFITS CHECK:  S/W  DEBBIE  @ [employer] # [phone]  1. ELIQUIS  2.5 MG  BID     AND    ELIQUIS  5 MG BID COVER- YES CO-PAY- $ 45.00 TIER- 3 DRUG PRIOR APPROVAL- NO   2. XARELTO 15 MG BID  AND   XARELTO 20 MG DAILY COVER- YES CO-PAY- $ 45.00 TIER- 3 DRUG PRIOR APPROVAL- NO  PREFERRED PHARMACY :  YES  [facility]   [facility]  M/O  90 DAY SUPPLY  $ 125.00

## 2017-12-13 NOTE — Progress Notes (Addendum)
#   2. Jamelle Haring  DEBBIE @ [place] # [phone]   1. ELIQUIS 2.5 MG BID  COVER- YES  CO-PAY- $ 45.00  TIER- 3 DRUG  PRIOR APPROVAL- NO   2. ELIQUIS 5 MG BID  COVER- YES  CO-PAY- $ 45.00  TIER- 3 DRUG  PRIOR APPROVAL- NO  PREFERRED PHARMACY : YES  [facility]  [facility] M/O -FOR 90 DAY SUPPLY $ 125.00

## 2017-12-13 NOTE — Progress Notes (Addendum)
Progress Note  Patient Name: Eric Herring Date of Encounter: 12/13/2017  Primary Cardiologist: Jenne Campus, MD   Subjective   Eric Herring is a 83 y.o. male with a hx of dilated cardiomyopathy with EF 30%, COPD on home oxygen, intermittent CPAP, stage IV CKD, DM type II, neuropathy, HLD, hypertension, pulmonary hypertension, MR, prostate cancer and lymphoma who was admitted 08/05 for SOB and cough. Cards asked to evaluate for decreased EF, afib and elevated troponin by Dr. Bonner Puna  08/08: Pt says has nebs at home but not home O2.  Feels his breathing is ok, would walk if someone was with him.  Not aware of the A fib at all.   Inpatient Medications    Scheduled Meds: . apixaban  2.5 mg Oral BID  . aspirin  81 mg Oral q1800  . feeding supplement (ENSURE ENLIVE)  237 mL Oral BID BM  . fluticasone furoate-vilanterol  1 puff Inhalation Q2000  . hydrALAZINE  10 mg Oral QID  . insulin aspart  0-9 Units Subcutaneous TID WC  . ipratropium-albuterol  3 mL Nebulization Q6H  . sodium chloride HYPERTONIC  4 mL Nebulization TID  . zolpidem  5 mg Oral Once   Continuous Infusions: . sodium chloride 10 mL/hr at 12/11/17 0505  . amiodarone 30 mg/hr (12/13/17 0643)  . azithromycin 500 mg (12/12/17 1416)  . cefTRIAXone (ROCEPHIN)  IV Stopped (12/12/17 1315)   PRN Meds: sodium chloride, albuterol, chlorpheniramine-HYDROcodone   Vital Signs    Vitals:   12/13/17 0600 12/13/17 0815 12/13/17 0940 12/13/17 1012  BP:  101/81  121/90  Pulse:      Resp:  18    Temp:  97.6 F (36.4 C)    TempSrc:  Oral    SpO2:   94%   Weight: 75.7 kg     Height:        Intake/Output Summary (Last 24 hours) at 12/13/2017 1155 Last data filed at 12/13/2017 0900 Gross per 24 hour  Intake 1089.59 ml  Output 875 ml  Net 214.59 ml   Filed Weights   12/11/17 0300 12/12/17 0358 12/13/17 0600  Weight: 73.3 kg 73.3 kg 75.7 kg    Telemetry    NSR, sinus brady at times, mostly Atrial fib with PVCs,  pairs and salvos - Personally Reviewed  ECG    08/06, Atrial fib, RBBB is old- Personally Reviewed  Physical Exam   General: frail, elderly, chronically ill-appearing, male, in no acute distress Head: Eyes PERRLA, No xanthomas.   Normocephalic and atraumatic  Lungs:  Decreased BS generally.  Breath sounds on the right greatly diminished. Heart: Irreg R&R, S1 S2, soft SEM, no rub/gallop noted.  Pulses are 2+ & equal. JVD 10-11 cm. Abdomen: Bowel sounds are present, abdomen soft and non-tender without masses or  hernias noted. Msk: Normal strength and tone for age. Extremities: No clubbing, cyanosis or edema.    Skin:  No rashes or lesions noted. Neuro: Alert and oriented X 3. Psych:  Good affect, responds appropriately  Labs    Chemistry Recent Labs  Lab 12/10/17 1038 12/11/17 0232 12/12/17 0334 12/13/17 0408  NA 139 142 136 138  K 4.9 4.6 4.3 4.5  CL 99 102 98 100  CO2 26 24 22 23   GLUCOSE 176* 167* 145* 100*  BUN 75* 77* 90* 94*  CREATININE 2.84* 2.84* 2.57* 2.68*  CALCIUM 9.9 9.5 9.2 9.5  PROT 8.6* 7.3  --   --   ALBUMIN 3.8 3.0*  --   --  AST 399* 244*  --   --   ALT 327* 272*  --   --   ALKPHOS 168* 145*  --   --   BILITOT 2.8* 2.4*  --   --   GFRNONAA 19* 19* 22* 21*  GFRAA 22* 22* 25* 24*  ANIONGAP 14 16* 16* 15     Hematology Recent Labs  Lab 12/11/17 0735 12/12/17 0334 12/13/17 0408  WBC 11.8* 13.1* 12.4*  RBC 4.57 4.37 4.44  HGB 12.6* 12.2* 12.4*  HCT 35.1* 33.2* 33.6*  MCV 76.8* 76.0* 75.7*  MCH 27.6 27.9 27.9  MCHC 35.9 36.7* 36.9*  RDW 14.2 14.3 14.1  PLT 157 168 190    Cardiac Enzymes Recent Labs  Lab 12/10/17 1038 12/10/17 1940 12/11/17 0232 12/11/17 0735  TROPONINI 0.68* 0.83* 0.93* 0.78*      BNP Recent Labs  Lab 12/10/17 1038  BNP 1,224.9*     DDimer  Recent Labs  Lab 12/10/17 1940  DDIMER 1.86*     Radiology    No results found.  Cardiac Studies    none this admit  Patient Profile     83 y.o. male  with acute on chronic combined CHF, COPD, PAF   Assessment & Plan    1.   A/C combined CHF:   - weight is up slightly - not currently on diuretics, but BUN/Cr are trending up - no po intake recorded so I/O are incomplete - JVD noted but he may be intravascularly dry - hydralazine held half the time due to low BP, will decrease dose to TID to see if can get all doses  2. PAF :    - in/out SR, sinus brady and Afib, some ventricular ectopy noted as well.  - still on amio IV, discuss changing to po w/ MD - low HR precludes CCB - now on Eliquis  3. Elevated troponin - peak 0.93 - not a cath candidate - sx may have been 2nd resp issues or rapid A fib - currently asymptomatic  For questions or updates, please contact [facility] Please consult [email or web address] for contact info under Cardiology/STEMI.      Signed, Rosaria Ferries, PA-C  12/13/2017, 11:55 AM    Attending Note:   The patient was seen and examined.  Agree with assessment and plan as noted above.  Changes made to the above note as needed.  Patient seen and independently examined with Rosaria Ferries, PA .   We discussed all aspects of the encounter. I agree with the assessment and plan as stated above.  1.   Acute on chronic combined systolic and diastolic congestive heart failure: The patient remained short of breath but I suspect this is because of his pneumonia.  His hands and outs are not accurate.  I suspect he may be close to being euvolemic. Currently not on diuretics.  His BUN and creatinine continue to gradually climb. BP remains low / normal  2.  Paroxysmal atrial fibrillation: The patient is currently in atrial fibrillation.  Been on IV   amiodarone.  Okay to transition to p.o. amiodarone 200  mg a day.  3.  Pneumonia: He has markedly diminished breath sounds on his right side.  This is likely due to his pneumonia.  Continue antibiotics.  4.  Troponin elevation: He is not a candidate for heart  catheterization.  Fortunately, he is not having any episodes of chest pain.  5.  Generalized weakness: Patient will walk with physical therapy today.  His  daughter states that he looks fairly weak.  The patient insists that he feels fine although he does look fairly weak and frail to me.   I have spent a total of 40 minutes with patient reviewing hospital  notes , telemetry, EKGs, labs and examining patient as well as establishing an assessment and plan that was discussed with the patient. > 50% of time was spent in direct patient care.    Thayer Headings, Brooke Bonito., MD, [facility] 12/13/2017, 3:11 PM [address]  [place] Pager [phone]

## 2017-12-13 NOTE — Discharge Summary (Addendum)
Physician Discharge Summary  Eric Herring TDD:[id number] DOB: 10-Jul-1934 DOA: 12/10/2017  PCP: Shelda Pal, DO  Admit date: 12/10/2017 Discharge date: 12/14/2017  Admitted From: Home Disposition: SNF  Recommendations for Outpatient Follow-up:  1. Follow up with PCP in 1-2 weeks 2. Follow up with cardiology, Dr. Delaine Lame in the next 2 weeks 3. Follow up with [place] pulmonology in the next 2 weeks  Home Health: None Equipment/Devices: Oxygen Discharge Condition: Stable CODE STATUS: DNR Diet recommendation: Heart healthy, carb-modified  Brief/Interim Summary: Eric Herring is an 83 y.o. male with a history of COPD on home oxygen, intermittent CPAP, chronic HFrEF, pulmonary HTN, stage IV CKD, HTN, hyperlipidemia, prostate CA, DLBCL who presented to [facility] with 4 days of shortness of breath. He was hypoxic on room air, BNP, troponins, and LFTs elevated. CXR suggestive of RML and LLL infiltrates, so antibiotics started. He required BiPAP and was transferred to [facility] SDU. CT chest demonstrated bronchial wall thickening and opacities consistent with multilobar bronchopneumonia versus aspiration pneumonia/pneumonitis. He was admitted with IV antibiotics and quickly weaned from BiPAP to supplemental oxygen. Developed atrial fibrillation for which heparin was started and amiodarone infusion started with conversion to NSR and now having some PVCs but rate well controlled. He was converted to low dose eliquis and po amiodarone per cardiology. Therapy evaluations have recommended short term rehabilitation following discharge, to which the patient reluctantly agrees. Will need cardiology and pulmonology follow up.   Discharge Diagnoses:  Active Problems:   COPD (chronic obstructive pulmonary disease) (HCC)   Bronchiectasis (HCC)   Type 2 diabetes mellitus with neurological complications (HCC)   Dilated cardiomyopathy (HCC)   Pulmonary hypertension, unspecified (HCC)   Acute on chronic  respiratory failure with hypoxia (HCC)   Paroxysmal atrial fibrillation (HCC)   Acute on chronic combined systolic and diastolic CHF (congestive heart failure) (HCC)  Acute on chronic hypoxic respiratory failure: due to multilobar pneumonia on COPD. RVP negative.  - Continue supplemental oxygen, follow up with pulmonology - Transition ceftriaxone, azithromycin to augmentin, azithromycin. Sputum culture normal flora. - Continue home nebs. No wheezing, so holding steroids. - Noted to have elevated D-dimer, though CT chest findings strongly suggest alternative primary cause of dyspnea/hypoxia and renal impairment does not allow for angiography. V/Q scan likely to be abnormal due to ventilation defects. Will defer further work up at this time. - SLP evaluation determined the patient is at mild aspiration risk.  Troponin elevation: Due to demand ischemia in setting of respiratory failure and stage IV CKD. ECG showed inverted T waves, RBBB though these don't seem terribly different from ECG 7/16.  - Trending downward (max 0.93) with no chest pain. Appreciate cardiology assistance, no plans for ischemic evaluation. Followed by Dr. Agustin Cree. - Will hold statin with transaminitis.  Chronic HFrEF: Based on recent echocardiogram. BNP elevated at 1224. Does not appear volume up, no pulmonary edema noted.  - Appreciate cardiology recommendations.  - Not on ACE/ARB due to CKD, not on beta blocker due to severe COPD. Hydralazine initially started but has been held most of the time due to hypotension.   Paroxysmal AFib with RVR:  - With LV dysfunction, not an ideal candidate for CCB, cardiology started amiodarone, will transition to po.  - CHA2DS2-VASc at least 5, started eliquis. CM consulted for affordability.  - Maintain K > 4 and Mg > 2 - Last TSH recently normal  Transaminitis: Improving. UDS negative. - Hep panel negative.  - Recommend holding statin for now and follow up  as outpatient.    AKI on stage IV CKD: Cr above putative baseline of 2.14 on admission and stable.  - Hold lasix as above, improving.  - Avoid hypotension and nephrotoxins.  QT prolongation: QTc persistently >500 (577msec 8/6) - Avoid provocative agents as able.  T2DM: HbA1c 6.1%.  - SSI AC  Discharge Instructions Discharge Instructions    Diet - low sodium heart healthy   Complete by:  As directed    Discharge instructions   Complete by:  As directed    You were hospitalized for pneumonia and will need to continue antibiotics (augmentin and azithromycin) for the next 4 days. You will also require oxygen at home, which will be arranged. An abnormal heart rhythm was detected, called AFib, which increases your risk of stroke. Fortunately the medication we've used to control the rate has been effective, but you will need to continue that medication (amiodarone) and a blood thinner (eliquis) which will lower your risk of stroke. For the time being you will need to NOT take pravastatin. This is because your liver function tests were slightly abnormal and will need to be rechecked prior to restarting this medication. - Follow up with your pulmonologist, cardiologist and primary doctors in the next 2 weeks or so. Or seek medical attention sooner if you develop worsened shortness of breath, chest pain, or fever.   Increase activity slowly   Complete by:  As directed      Allergies as of 12/14/2017      Reactions   Lisinopril Anaphylaxis, Swelling         Medication List    STOP taking these medications   budesonide-formoterol 160-4.5 MCG/ACT inhaler Commonly known as:  SYMBICORT   pravastatin 40 MG tablet Commonly known as:  PRAVACHOL     TAKE these medications   albuterol (2.5 MG/3ML) 0.083% nebulizer solution Commonly known as:  PROVENTIL Take 3 mLs (2.5 mg total) by nebulization 2 (two) times daily.   albuterol 108 (90 Base) MCG/ACT inhaler Commonly known as:  PROVENTIL HFA;VENTOLIN  HFA Inhale 2 puffs into the lungs 2 (two) times daily.   amiodarone 200 MG tablet Commonly known as:  PACERONE Take 1 tablet (200 mg total) by mouth daily.   amoxicillin-clavulanate 875-125 MG tablet Commonly known as:  AUGMENTIN Take 1 tablet by mouth 2 (two) times daily for 4 days.   apixaban 2.5 MG Tabs tablet Commonly known as:  ELIQUIS Take 1 tablet (2.5 mg total) by mouth 2 (two) times daily.   aspirin 81 MG tablet Take 81 mg by mouth daily.   azithromycin 250 MG tablet Commonly known as:  ZITHROMAX Take 1 tablet (250 mg total) by mouth daily.   calcium-vitamin D 500-200 MG-UNIT tablet Take 1 tablet by mouth daily.   fluticasone furoate-vilanterol 100-25 MCG/INH Aepb Commonly known as:  BREO ELLIPTA Inhale 1 puff into the lungs daily.   furosemide 40 MG tablet Commonly known as:  LASIX Take 1 tablet (40 mg total) by mouth daily.   hydrALAZINE 10 MG tablet Commonly known as:  APRESOLINE Take 1 tablet (10 mg total) by mouth 4 (four) times daily.   multivitamin tablet Take 1 tablet by mouth.   potassium chloride 10 MEQ tablet Commonly known as:  K-DUR Take 1 tablet (10 mEq total) by mouth daily.   umeclidinium bromide 62.5 MCG/INH Aepb Commonly known as:  INCRUSE ELLIPTA Inhale 1 puff into the lungs daily.            Durable Medical  Equipment  (From admission, onward)         Start     Ordered   12/13/17 1436  For home use only DME 3 n 1  Once     12/13/17 1435   12/13/17 0905  For home use only DME oxygen  Once    Question Answer Comment  Mode or (Route) Nasal cannula   Liters per Minute 4   Frequency Continuous (stationary and portable oxygen unit needed)   Oxygen delivery system Gas      12/13/17 0905         Follow-up Information    Shelda Pal, DO Follow up.   Specialty:  Family Medicine Contact information: [place] [address] [phone]        Park Liter, MD .    Specialty:  Cardiology Contact information: [place] [address] [phone]        Melvenia Needles, NP. Schedule an appointment as soon as possible for a visit in 2 week(s).   Specialty:  Pulmonary Disease Contact information: [address] [place] [address] [phone]          Allergies  Allergen Reactions  . Lisinopril Anaphylaxis and Swelling         Consultations:  Cardiology  Procedures/Studies: Ct Chest Wo Contrast  Result Date: 12/10/2017 CLINICAL DATA:  83 year old male hypoxic with oxygen saturation 86% on room air. EXAM: CT CHEST WITHOUT CONTRAST TECHNIQUE: Multidetector CT imaging of the chest was performed following the standard protocol without IV contrast. COMPARISON:  Chest CT 11/04/2014. FINDINGS: Cardiovascular: Heart size is borderline enlarged. There is no significant pericardial fluid, thickening or pericardial calcification. There is aortic atherosclerosis, as well as atherosclerosis of the great vessels of the mediastinum and the coronary arteries, including calcified atherosclerotic plaque in the left main, left anterior descending, left circumflex and right coronary arteries. Mediastinum/Nodes: No pathologically enlarged mediastinal or hilar lymph nodes. Please note that accurate exclusion of hilar adenopathy is limited on noncontrast CT scans. Esophagus is unremarkable in appearance. No axillary lymphadenopathy. Lungs/Pleura: Diffuse bronchial wall thickening with moderate centrilobular and paraseptal emphysema. Scattered areas of mild cylindrical bronchiectasis, most evident in the lower lobes of the lungs bilaterally (left greater than right). Extensive mucoid impaction within the left lower lobe where there is also thickening of the peribronchovascular interstitium as well as peribronchovascular airspace consolidation throughout the left lower lobe, concerning for sequela of recent aspiration and/or developing bronchopneumonia.  Irregular areas of airspace consolidation are also noted in a peribronchovascular distribution in the dependent portion of the left upper lobe posteriorly. Right lung appears relatively clear. No pleural effusions. A few scattered small pulmonary nodules are noted in the lungs bilaterally, largest of which is in the periphery of the right middle lobe (axial image 100 of series 4), unchanged. No larger more suspicious appearing pulmonary nodules or masses are noted. Upper Abdomen: Aortic atherosclerosis. Calcification in the inferior vena cava, similar to the prior examination, likely calcified chronic thrombus. Musculoskeletal: There are no aggressive appearing lytic or blastic lesions noted in the visualized portions of the skeleton. IMPRESSION: 1. Scattered areas of bronchiectasis in the lungs bilaterally, most evident in the lower lobes. In the dependent portions of the left lung there is extensive peribronchovascular airspace consolidation, concerning for sequela of recent aspiration or developing multilobar bronchopneumonia. 2. Diffuse bronchial wall thickening with moderate centrilobular and paraseptal emphysema; imaging findings suggestive of underlying COPD. 3. Multiple small  pulmonary nodules measuring 5 mm or less in size, similar to the prior study from 2016, considered definitively benign. 4. Aortic atherosclerosis, in addition to left main and 3 vessel coronary artery disease. 5. Mild cardiomegaly. 6. Additional incidental findings, similar prior studies, as above. Aortic Atherosclerosis (ICD10-I70.0) and Emphysema (ICD10-J43.9). Electronically Signed   By: Vinnie Langton M.D.   On: 12/10/2017 21:59   Dg Chest Port 1 View  Result Date: 12/10/2017 CLINICAL DATA:  Shortness of breath. EXAM: PORTABLE CHEST 1 VIEW COMPARISON:  Chest x-ray dated 09/20/2017. FINDINGS: Heart size and mediastinal contours are stable. Lungs are hyperexpanded. There are new streaky opacities at the LEFT lung base suggesting  pneumonia. No pleural effusion or pneumothorax seen. No acute or suspicious osseous finding. IMPRESSION: 1. New ill-defined/streaky opacities at the LEFT lung base, suspicious for pneumonia. 2. Hyperexpanded lungs indicating COPD. Electronically Signed   By: Franki Cabot M.D.   On: 12/10/2017 11:03     Subjective: Breathing is better, but feels weaker than baseline, agrees to rehabilitation. No chest pain. Had rate elevated to 110's this AM which has resolved, now sustaining in 80's. Breathing better, able to walk around. No chest pain or bleeding.  Discharge Exam: Vitals:   12/14/17 0904 12/14/17 1209  BP:  95/69  Pulse:  87  Resp:  18  Temp:  97.6 F (36.4 C)  SpO2: 97% 98%   General: Pt is alert, awake, not in acute distress Cardiovascular: RRR, S1/S2 +, no rubs, no gallops Respiratory: Clear but diminished Abdominal: Soft, NT, ND, bowel sounds + Extremities: Trace edema, no cyanosis  Labs: BNP (last 3 results) Recent Labs    12/10/17 1038  BNP 0,981.1*   Basic Metabolic Panel: Recent Labs  Lab 12/10/17 1038 12/11/17 0232 12/12/17 0334 12/13/17 0408 12/14/17 0611  NA 139 142 136 138 141  K 4.9 4.6 4.3 4.5 4.9  CL 99 102 98 100 101  CO2 26 24 22 23 27   GLUCOSE 176* 167* 145* 100* 91  BUN 75* 77* 90* 94* 86*  CREATININE 2.84* 2.84* 2.57* 2.68* 2.61*  CALCIUM 9.9 9.5 9.2 9.5 9.8   Liver Function Tests: Recent Labs  Lab 12/10/17 1038 12/11/17 0232  AST 399* 244*  ALT 327* 272*  ALKPHOS 168* 145*  BILITOT 2.8* 2.4*  PROT 8.6* 7.3  ALBUMIN 3.8 3.0*   No results for input(s): LIPASE, AMYLASE in the last 168 hours. No results for input(s): AMMONIA in the last 168 hours. CBC: Recent Labs  Lab 12/10/17 1038 12/11/17 0735 12/12/17 0334 12/13/17 0408 12/14/17 0611  WBC 8.8 11.8* 13.1* 12.4* 11.0*  NEUTROABS 7.7  --   --   --   --   HGB 14.6 12.6* 12.2* 12.4* 13.0  HCT 38.2* 35.1* 33.2* 33.6* 35.3*  MCV 73.9* 76.8* 76.0* 75.7* 75.1*  PLT 156 157 168  190 182   Cardiac Enzymes: Recent Labs  Lab 12/10/17 1038 12/10/17 1940 12/11/17 0232 12/11/17 0735  TROPONINI 0.68* 0.83* 0.93* 0.78*   BNP: Invalid input(s): POCBNP CBG: Recent Labs  Lab 12/13/17 1241 12/13/17 1731 12/13/17 2006 12/14/17 0801 12/14/17 1211  GLUCAP 127* 113* 94 84 118*   D-Dimer No results for input(s): DDIMER in the last 72 hours. Hgb A1c No results for input(s): HGBA1C in the last 72 hours. Lipid Profile No results for input(s): CHOL, HDL, LDLCALC, TRIG, CHOLHDL, LDLDIRECT in the last 72 hours. Thyroid function studies No results for input(s): TSH, T4TOTAL, T3FREE, THYROIDAB in the last 72 hours.  Invalid  input(s): FREET3 Anemia work up No results for input(s): VITAMINB12, FOLATE, FERRITIN, TIBC, IRON, RETICCTPCT in the last 72 hours. Urinalysis No results found for: COLORURINE, APPEARANCEUR, [place], [place], [place], [place], [place], [place], [place], [place], NITRITE, [place]  Microbiology Recent Results (from the past 240 hour(s))  MRSA PCR Screening     Status: None   Collection Time: 12/10/17  8:52 PM  Result Value Ref Range Status   MRSA by PCR NEGATIVE NEGATIVE Final    Comment:        The GeneXpert MRSA Assay (FDA approved for NASAL specimens only), is one component of a comprehensive MRSA colonization surveillance program. It is not intended to diagnose MRSA infection nor to guide or monitor treatment for MRSA infections. Performed at [facility], [place] [address] [place], [place] [address]   Respiratory Panel by PCR     Status: None   Collection Time: 12/10/17  8:52 PM  Result Value Ref Range Status   Adenovirus NOT DETECTED NOT DETECTED Final   Coronavirus 229E NOT DETECTED NOT DETECTED Final   Coronavirus HKU1 NOT DETECTED NOT DETECTED Final   Coronavirus NL63 NOT DETECTED NOT DETECTED Final   Coronavirus OC43 NOT DETECTED NOT DETECTED Final   Metapneumovirus NOT DETECTED NOT DETECTED Final    Rhinovirus / Enterovirus NOT DETECTED NOT DETECTED Final   Influenza A NOT DETECTED NOT DETECTED Final   Influenza B NOT DETECTED NOT DETECTED Final   Parainfluenza Virus 1 NOT DETECTED NOT DETECTED Final   Parainfluenza Virus 2 NOT DETECTED NOT DETECTED Final   Parainfluenza Virus 3 NOT DETECTED NOT DETECTED Final   Parainfluenza Virus 4 NOT DETECTED NOT DETECTED Final   Respiratory Syncytial Virus NOT DETECTED NOT DETECTED Final   Bordetella pertussis NOT DETECTED NOT DETECTED Final   Chlamydophila pneumoniae NOT DETECTED NOT DETECTED Final   Mycoplasma pneumoniae NOT DETECTED NOT DETECTED Final    Comment: Performed at [facility], [place] [address], [place], [place] [address]  Expectorated sputum assessment w rflx to resp cult     Status: None   Collection Time: 12/11/17  6:22 AM  Result Value Ref Range Status   Specimen Description EXPECTORATED SPUTUM  Final   Special Requests NONE  Final   Sputum evaluation   Final    THIS SPECIMEN IS ACCEPTABLE FOR SPUTUM CULTURE Performed at [facility], [place] [address], [place], [place] [address]    Report Status 12/11/2017 FINAL  Final  Culture, respiratory     Status: None   Collection Time: 12/11/17  6:22 AM  Result Value Ref Range Status   Specimen Description EXPECTORATED SPUTUM  Final   Special Requests NONE Reflexed from [id number]  Final   Gram Stain   Final    ABUNDANT WBC PRESENT, PREDOMINANTLY PMN FEW GRAM POSITIVE COCCI FEW GRAM POSITIVE RODS    Culture   Final    Consistent with normal respiratory flora. Performed at [facility], [place] [address], [place], [place] [address]    Report Status 12/13/2017 FINAL  Final    Time coordinating discharge: Approximately 40 minutes  Patrecia Pour, MD  [facility] 12/14/2017, 12:21 PM Pager [phone]

## 2017-12-13 NOTE — Progress Notes (Addendum)
[place] for Heparin/apixaban Indication: atrial fibrillation  Allergies  Allergen Reactions  . Lisinopril Anaphylaxis and Swelling         Patient Measurements: Height: 5\' 6"  (167.6 cm) Weight: 166 lb 14.2 oz (75.7 kg) IBW/kg (Calculated) : 63.8 Heparin Dosing Weight: 73 kg  Vital Signs: Temp: 97.6 F (36.4 C) (08/08 0815) Temp Source: Oral (08/08 0815) BP: 101/81 (08/08 0815) Pulse Rate: 67 (08/08 0314)  Labs: Recent Labs    12/10/17 1940 12/11/17 0232 12/11/17 0735 12/12/17 0334 12/12/17 1359 12/13/17 0408  HGB  --   --  12.6* 12.2*  --  12.4*  HCT  --   --  35.1* 33.2*  --  33.6*  PLT  --   --  157 168  --  190  HEPARINUNFRC  --   --   --  0.29* 0.61 0.63  CREATININE  --  2.84*  --  2.57*  --  2.68*  TROPONINI 0.83* 0.93* 0.78*  --   --   --     Estimated Creatinine Clearance: 19.5 mL/min (A) (by C-G formula based on SCr of 2.68 mg/dL (H)).   Medical History: Past Medical History:  Diagnosis Date  . Allergy   . Arthropathy   . CAD (coronary artery disease)   . Cardiomyopathy ([place])   . Chronic kidney disease   . Colon polyp   . COPD (chronic obstructive pulmonary disease) ([place])   . Diabetes ([place])   . Diabetic neuropathy ([place])   . DOE (dyspnea on exertion)   . Heart murmur   . Hypercholesterolemia   . Hypertension    pulmonary  . LVH (left ventricular hypertrophy)   . Lymphoma ([place])   . Mitral regurgitation   . Prostate cancer ([place])   . Pulmonary hypertension ([place])   . Tricuspid regurgitation    Assessment: 83 yr old male to begin IV heparin for atrial fibrillation. Heparin level therapeutic this AM at 0.63. CBC stable. No bleed or IV line issues per discussion with RN.   Plan is to transition patient to oral apixaban. Age >68, Wt>60kg, scr>1.5. We will use reduced dose apixaban.   Goal of Therapy:  Heparin level 0.3-0.7 units/ml Monitor platelets by anticoagulation protocol: Yes   Plan:  Dc  heparin Apixaban 2.5mg  PO BID Monitor for bleeding   Onnie Boer, PharmD, BCIDP, AAHIVP, CPP Infectious Disease Pharmacist Pager: [phone] 12/13/2017 9:40 AM

## 2017-12-13 NOTE — Discharge Instructions (Signed)

## 2017-12-13 NOTE — Progress Notes (Signed)
CSW informed that pt now being recommended for SNF and is agreeable  CSW initiated bed search- family interested in [facility]  [place] initiated for anticipated DC tomorrow  CSW will continue to follow  Jorge Ny, [place] Social Worker [phone]

## 2017-12-13 NOTE — Progress Notes (Signed)
Occupational Therapy Treatment Patient Details Name: Eric Herring MRN: [id number] DOB: Mar 01, 1935 Today's Date: 12/13/2017    History of present illness 83 y.o. male with a history of COPD on home oxygen, intermittent CPAP, chronic HFrEF, pulmonary HTN, stage IV CKD, HTN, hyperlipidemia, prostate CA, lymphoma who presented to [facility] with 4 days of shortness of breath. He was hypoxic on room air, BNP, troponins, and LFTs elevated. CXR suggestive of RML and LLL infiltrates, CT chest demonstrated bronchial wall thickening and opacities consistent with multilobar bronchopneumonia versus aspiration pneumonia/pneumonitis   OT comments  Pt able to complete self care and ambulate @ 20 ft in room on 3L with several rest breaks. Pt appears more dyspniec during today's session. Difficult to obtain reliable O2 sat during activity. At rest SpO2 96 on 3L. Pt states he "can't get enough air". Will continue to follow acutely to facilitate DC to most appropriate venue of care.   Follow Up Recommendations  Home health OT(vs SNF pending progress))    Equipment Recommendations  3 in 1 bedside commode    Recommendations for Other Services      Precautions / Restrictions Precautions Precautions: Fall Precaution Comments: monitor sats Restrictions Weight Bearing Restrictions: No       Mobility Bed Mobility Overal bed mobility: Modified Independent             General bed mobility comments: HOB elevated  Transfers Overall transfer level: Needs assistance Equipment used: None Transfers: Sit to/from Stand Sit to Stand: Supervision             Balance   Sitting-balance support: Feet supported Sitting balance-Leahy Scale: Normal     Standing balance support: No upper extremity supported;During functional activity Standing balance-Leahy Scale: Good                             ADL either performed or assessed with clinical judgement   ADL      Completed ADL while  educating pt on energy conservation strategies. Handout issued and reviewed. Pt also given written information on the affect of weather and pollution/pollen on COPD as pt loves to be outside. 2/4 dyspnea. Unable to obtain O2 sat.                                          Vision       Perception     Praxis      Cognition Arousal/Alertness: Awake/alert Behavior During Therapy: WFL for tasks assessed/performed Overall Cognitive Status: Within Functional Limits for tasks assessed                                          Exercises Exercises: Other exercises Other Exercises Other Exercises: pursed liip breathing   Shoulder Instructions       General Comments      Pertinent Vitals/ Pain       Pain Assessment: No/denies pain  Home Living                                          Prior Functioning/Environment  Frequency  Min 2X/week        Progress Toward Goals  OT Goals(current goals can now be found in the care plan section)  Progress towards OT goals: Progressing toward goals  Acute Rehab OT Goals Patient Stated Goal: go home  OT Goal Formulation: With patient Time For Goal Achievement: 12/26/17 Potential to Achieve Goals: Good ADL Goals Additional ADL Goal #1: Pt will independently verbalize 3 strategies to reduce risk of falls Additional ADL Goal #2: Pt will independently verbalize 3 energy conservation strategies  Plan Discharge plan needs to be updated    Co-evaluation                 AM-PAC PT "6 Clicks" Daily Activity     Outcome Measure   Help from another person eating meals?: None Help from another person taking care of personal grooming?: None Help from another person toileting, which includes using toliet, bedpan, or urinal?: A Little Help from another person bathing (including washing, rinsing, drying)?: None Help from another person to put on and taking off regular  upper body clothing?: None Help from another person to put on and taking off regular lower body clothing?: None 6 Click Score: 23    End of Session Equipment Utilized During Treatment: Oxygen(3L)  OT Visit Diagnosis: Muscle weakness (generalized) (M62.81)   Activity Tolerance Patient tolerated treatment well   Patient Left in bed;with call bell/phone within reach   Nurse Communication Mobility status        Time: 2500-3704 OT Time Calculation (min): 33 min  Charges: OT General Charges $OT Visit: 1 Visit OT Treatments $Self Care/Home Management : 23-37 mins  Maurie Boettcher, OT/L  OT Clinical Specialist [phone]    [facility] 12/13/2017, 5:17 PM

## 2017-12-14 LAB — BASIC METABOLIC PANEL
Anion gap: 13 (ref 5–15)
BUN: 86 mg/dL — AB (ref 8–23)
CHLORIDE: 101 mmol/L (ref 98–111)
CO2: 27 mmol/L (ref 22–32)
CREATININE: 2.61 mg/dL — AB (ref 0.61–1.24)
Calcium: 9.8 mg/dL (ref 8.9–10.3)
GFR calc Af Amer: 25 mL/min — ABNORMAL LOW (ref >60)
GFR calc non Af Amer: 21 mL/min — ABNORMAL LOW (ref >60)
Glucose, Bld: 91 mg/dL (ref 70–99)
Potassium: 4.9 mmol/L (ref 3.5–5.1)
SODIUM: 141 mmol/L (ref 135–145)

## 2017-12-14 LAB — CBC
HCT: 35.3 % — ABNORMAL LOW (ref 39.0–52.0)
HEMOGLOBIN: 13 g/dL (ref 13.0–17.0)
MCH: 27.7 pg (ref 26.0–34.0)
MCHC: 36.8 g/dL — AB (ref 30.0–36.0)
MCV: 75.1 fL — ABNORMAL LOW (ref 78.0–100.0)
Platelets: 182 10*3/uL (ref 150–400)
RBC: 4.7 MIL/uL (ref 4.22–5.81)
RDW: 14.3 % (ref 11.5–15.5)
WBC: 11 10*3/uL — ABNORMAL HIGH (ref 4.0–10.5)

## 2017-12-14 LAB — GLUCOSE, CAPILLARY
GLUCOSE-CAPILLARY: 84 mg/dL (ref 70–99)
Glucose-Capillary: 118 mg/dL — ABNORMAL HIGH (ref 70–99)

## 2017-12-14 MED ORDER — AMIODARONE HCL 200 MG PO TABS
400.0000 mg | ORAL_TABLET | Freq: Every day | ORAL | Status: DC
  Administered 2017-12-14: 400 mg via ORAL
  Filled 2017-12-14: qty 2

## 2017-12-14 NOTE — Clinical Social Work Note (Addendum)
Clinical Social Work Assessment  Patient Details  Name: Eric Herring MRN: [id number] Date of Birth: May 11, 1934  Date of referral:  12/13/17               Reason for consult:  Facility Placement                Permission sought to share information with:  Facility Sport and exercise psychologist, Family Supports Permission granted to share information::  Yes, Verbal Permission Granted  Name::     Farley Ly  Agency::  SNF  Relationship::  wife, son  Contact Information:     Housing/Transportation Living arrangements for the past 2 months:  Single Family Home Source of Information:  Patient, Adult Children, Spouse Patient Interpreter Needed:  None Criminal Activity/Legal Involvement Pertinent to Current Situation/Hospitalization:  No - Comment as needed Significant Relationships:  Adult Children, Spouse Lives with:  Spouse Do you feel safe going back to the place where you live?  No Need for family participation in patient care:  No (Coment)  Care giving concerns:  Pt lives at home with wife who is ill- pt is normally the caregiver for wife at home.  Pt now with increased weakness and would not be safe to take care of himself or his wife.   Social Worker assessment / plan:  CSW spoke with pt concerning SNF placement- pt is agreeable to short term stay.  CSW then spoke with pt wife and son over the phone and informed of recommendation for SNF and SNF referral process/ need for authorization from insurance.  Employment status:  Retired Nurse, adult PT Recommendations:  [place] / Referral to community resources:  [place]  Patient/Family's Response to care:  Family and pt agreeable to SNF placement- acknowledges that going home would not be safe at this time.  Patient/Family's Understanding of and Emotional Response to Diagnosis, Current Treatment, and Prognosis:  Pt and family seem to have good understanding of  pt condition and limitations- hopeful that rehab stay will be very short so he can return home with wife soon.  Emotional Assessment Appearance:  Appears stated age Attitude/Demeanor/Rapport:    Affect (typically observed):  Appropriate Orientation:  Oriented to Self, Oriented to Place Alcohol / Substance use:  Not Applicable Psych involvement (Current and /or in the community):  No (Comment)  Discharge Needs  Concerns to be addressed:  Care Coordination Readmission within the last 30 days:  No Current discharge risk:  Physical Impairment Barriers to Discharge:  Insurance Authorization   Jorge Ny, LCSW 12/14/2017, 9:23 AM

## 2017-12-14 NOTE — Care Management Note (Signed)
Case Management Note  Patient Details  Name: Eric Herring MRN: [id number] Date of Birth: 03-Jan-1935  Subjective/Objective:    From home with spouse, presents with acute/chronic resp failure secondary to CAP, increased trops, tachy, aki, chf .  8/8 Tomi Bamberger RN, BSN- NCM gave patient the Eliquis 30 day savings coupon, and informed him and daughter of co pmt amt of 45.0 for refills.  Patient may also need home oxygen per RN, NCM awaiting ambulatory saturations. Patient is now interested in going to SNF at dc.                                Action/Plan: DC to SNF when medically ready.  Expected Discharge Date:  12/14/17               Expected Discharge Plan:  Skilled Nursing Facility  In-House Referral:  Clinical Social Work  Discharge planning Services  CM Consult  Post Acute Care Choice:    Choice offered to:     DME Arranged:    DME Agency:     HH Arranged:    [place] Agency:     Status of Service:  Completed, signed off  If discussed at [employer], dates discussed:    Additional Comments:  Zenon Mayo, RN 12/14/2017, 12:24 PM

## 2017-12-14 NOTE — Clinical Social Work Placement (Signed)
   CLINICAL SOCIAL WORK PLACEMENT  NOTE  Date:  12/14/2017  Patient Details  Name: Eric Herring MRN: [id number] Date of Birth: 06-Mar-1935  Clinical Social Work is seeking post-discharge placement for this patient at the [place] level of care (*CSW will initial, date and re-position this form in  chart as items are completed):  Yes   Patient/family provided with [place] Work Department's list of facilities offering this level of care within the geographic area requested by the patient (or if unable, by the patient's family).  Yes   Patient/family informed of their freedom to choose among providers that offer the needed level of care, that participate in Medicare, Medicaid or managed care program needed by the patient, have an available bed and are willing to accept the patient.  Yes   Patient/family informed of [facility]'s ownership interest in [facility] and [facility], as well as of the fact that they are under no obligation to receive care at these facilities.  PASRR submitted to EDS on 12/13/17     PASRR number received on 12/13/17     Existing PASRR number confirmed on       FL2 transmitted to all facilities in geographic area requested by pt/family on 12/13/17     FL2 transmitted to all facilities within larger geographic area on       Patient informed that his/her managed care company has contracts with or will negotiate with certain facilities, including the following:        Yes   Patient/family informed of bed offers received.  Patient chooses bed at [facility]     Physician recommends and patient chooses bed at      Patient to be transferred to [facility] on 12/14/17.  Patient to be transferred to facility by ptar     Patient family notified on 12/14/17 of transfer.  Name of family member notified:  hattie     PHYSICIAN Please sign FL2, Please sign DNR     Additional Comment:      _______________________________________________ Jorge Ny, LCSW 12/14/2017, 1:38 PM

## 2017-12-14 NOTE — Progress Notes (Signed)
[address] [place] received- # [phone]  Patient will discharge to [facility] SNF Anticipated discharge date: 8/9 Family notified:wife, dtr at bedside Transportation by [facility]- scheduled for 2:30pm  CSW signing off.  Jorge Ny, LCSW Clinical Social Worker [phone]

## 2017-12-17 ENCOUNTER — Non-Acute Institutional Stay (SKILLED_NURSING_FACILITY): Payer: Medicare HMO | Admitting: Internal Medicine

## 2017-12-17 ENCOUNTER — Telehealth: Payer: Self-pay

## 2017-12-17 DIAGNOSIS — I5022 Chronic systolic (congestive) heart failure: Secondary | ICD-10-CM | POA: Diagnosis not present

## 2017-12-17 DIAGNOSIS — J181 Lobar pneumonia, unspecified organism: Secondary | ICD-10-CM | POA: Diagnosis not present

## 2017-12-17 DIAGNOSIS — R9431 Abnormal electrocardiogram [ECG] [EKG]: Secondary | ICD-10-CM

## 2017-12-17 DIAGNOSIS — E876 Hypokalemia: Secondary | ICD-10-CM

## 2017-12-17 DIAGNOSIS — J189 Pneumonia, unspecified organism: Secondary | ICD-10-CM

## 2017-12-17 DIAGNOSIS — J9621 Acute and chronic respiratory failure with hypoxia: Secondary | ICD-10-CM | POA: Diagnosis not present

## 2017-12-17 DIAGNOSIS — I480 Paroxysmal atrial fibrillation: Secondary | ICD-10-CM

## 2017-12-17 DIAGNOSIS — E1149 Type 2 diabetes mellitus with other diabetic neurological complication: Secondary | ICD-10-CM

## 2017-12-17 DIAGNOSIS — M810 Age-related osteoporosis without current pathological fracture: Secondary | ICD-10-CM

## 2017-12-17 DIAGNOSIS — R740 Nonspecific elevation of levels of transaminase and lactic acid dehydrogenase [LDH]: Secondary | ICD-10-CM

## 2017-12-17 DIAGNOSIS — N189 Chronic kidney disease, unspecified: Secondary | ICD-10-CM

## 2017-12-17 DIAGNOSIS — I110 Hypertensive heart disease with heart failure: Secondary | ICD-10-CM

## 2017-12-17 DIAGNOSIS — R7401 Elevation of levels of liver transaminase levels: Secondary | ICD-10-CM

## 2017-12-17 DIAGNOSIS — N179 Acute kidney failure, unspecified: Secondary | ICD-10-CM

## 2017-12-17 NOTE — Telephone Encounter (Signed)
TCM hospital follow up call made to patient. Discharged to SNF per wife. States they will call and schedule follow up call with Dr. [name] once he is discharged.

## 2017-12-19 ENCOUNTER — Non-Acute Institutional Stay (SKILLED_NURSING_FACILITY): Payer: Medicare HMO | Admitting: Internal Medicine

## 2017-12-19 ENCOUNTER — Encounter: Payer: Self-pay | Admitting: Internal Medicine

## 2017-12-19 DIAGNOSIS — J189 Pneumonia, unspecified organism: Secondary | ICD-10-CM

## 2017-12-19 DIAGNOSIS — I110 Hypertensive heart disease with heart failure: Secondary | ICD-10-CM

## 2017-12-19 DIAGNOSIS — N189 Chronic kidney disease, unspecified: Secondary | ICD-10-CM

## 2017-12-19 DIAGNOSIS — J181 Lobar pneumonia, unspecified organism: Secondary | ICD-10-CM | POA: Diagnosis not present

## 2017-12-19 DIAGNOSIS — E876 Hypokalemia: Secondary | ICD-10-CM

## 2017-12-19 DIAGNOSIS — N179 Acute kidney failure, unspecified: Secondary | ICD-10-CM

## 2017-12-19 DIAGNOSIS — R7401 Elevation of levels of liver transaminase levels: Secondary | ICD-10-CM

## 2017-12-19 DIAGNOSIS — J9621 Acute and chronic respiratory failure with hypoxia: Secondary | ICD-10-CM

## 2017-12-19 DIAGNOSIS — R740 Nonspecific elevation of levels of transaminase and lactic acid dehydrogenase [LDH]: Secondary | ICD-10-CM

## 2017-12-19 DIAGNOSIS — J432 Centrilobular emphysema: Secondary | ICD-10-CM | POA: Diagnosis not present

## 2017-12-19 DIAGNOSIS — I5022 Chronic systolic (congestive) heart failure: Secondary | ICD-10-CM | POA: Diagnosis not present

## 2017-12-19 DIAGNOSIS — I480 Paroxysmal atrial fibrillation: Secondary | ICD-10-CM

## 2017-12-19 DIAGNOSIS — R9431 Abnormal electrocardiogram [ECG] [EKG]: Secondary | ICD-10-CM

## 2017-12-19 DIAGNOSIS — E1149 Type 2 diabetes mellitus with other diabetic neurological complication: Secondary | ICD-10-CM

## 2017-12-19 DIAGNOSIS — M810 Age-related osteoporosis without current pathological fracture: Secondary | ICD-10-CM

## 2017-12-19 NOTE — Progress Notes (Signed)
:  Location:  [place] Room Number: [phone] Place of Service:  SNF (31)  Eric Herring. Eric Coil, MD  Patient Care Team: Shelda Pal, DO as PCP - General (Family Medicine) Park Liter, MD as PCP - Cardiology (Cardiology)  Extended Emergency Contact Information Primary Emergency Contact: Herring,Eric  Johnnette Litter of [place] Phone: [phone] Relation: Spouse Secondary Emergency Contact: Herring, [place], Eric [id number] Johnnette Litter of [place] Mobile Phone: [phone] Relation: Son     Allergies: Lisinopril  Chief Complaint  Patient presents with  . New Admit To SNF    Admit to [facility]    HPI: Patient is 83 y.o. male  With COPD on home O2, intermittent CPAP, chronic heart failure with preserved ejection fraction, pulmonary hypertension, stage IV kidney disease, hypertension, hyperlipidemia, prostate cancer, DLBCL who presented to [facility] 4 days shortness of breath.  He was hypoxic on room air, BMP troponins and LFTs were elevated and chest x-ray suggestive of right middle lobe and left lower lobe infiltrate.  Patient was admitted to [facility] from 8/5-9 where he was treated with antibiotics and BiPAP in a stepdown unit.  CT chest demonstrated opacities consistent with multi lobar mild bronchopneumonia versus aspiration pneumonia.  He was able to be quickly weaned from BiPAP to supplemental oxygen.  Patient did develop atrial fibrillation for which heparin was started and amiodarone infusion with conversion to normal sinus rhythm, having some PVCs but rate well controlled.  He was converted to low-dose Eliquis and p.o. amiodarone per cardiology.  Is admitted to skilled nursing facility for OT/PT.  While at skilled nursing facility patient will be followed for hypertension treated with Lasix and hydralazine, hypokalemia treated with potassium and osteoporosis treated with calcium plus D. Past  Medical History:  Diagnosis Date  . Allergy   . Arthropathy   . CAD (coronary artery disease)   . Cardiomyopathy ([place])   . Chronic kidney disease   . Colon polyp   . COPD (chronic obstructive pulmonary disease) ([place])   . Diabetes ([place])   . Diabetic neuropathy ([place])   . DOE (dyspnea on exertion)   . Heart murmur   . Hypercholesterolemia   . Hypertension    pulmonary  . LVH (left ventricular hypertrophy)   . Lymphoma ([place])   . Mitral regurgitation   . Prostate cancer ([place])   . Pulmonary hypertension ([place])   . Tricuspid regurgitation     Past Surgical History:  Procedure Laterality Date  . HEMORROIDECTOMY     pt does not remember year    Allergies as of 12/17/2017      Reactions   Lisinopril Anaphylaxis, Swelling         Medication List        Accurate as of 12/17/17 11:59 PM. Always use your most recent med list.          albuterol (2.5 MG/3ML) 0.083% nebulizer solution Commonly known as:  PROVENTIL Take 3 mLs (2.5 mg total) by nebulization 2 (two) times daily.   albuterol 108 (90 Base) MCG/ACT inhaler Commonly known as:  PROVENTIL HFA;VENTOLIN HFA Inhale 2 puffs into the lungs 2 (two) times daily.   amiodarone 200 MG tablet Commonly known as:  PACERONE Take 1 tablet (200 mg total) by mouth daily.   amoxicillin-clavulanate 875-125 MG tablet Commonly known as:  AUGMENTIN Take 1 tablet by mouth 2 (two) times daily  for 4 days.   apixaban 2.5 MG Tabs tablet Commonly known as:  ELIQUIS Take 1 tablet (2.5 mg total) by mouth 2 (two) times daily.   aspirin 81 MG tablet Take 81 mg by mouth daily.   azithromycin 250 MG tablet Commonly known as:  ZITHROMAX Take 1 tablet (250 mg total) by mouth daily.   calcium-vitamin D 500-200 MG-UNIT tablet Take 1 tablet by mouth daily.   fluticasone furoate-vilanterol 100-25 MCG/INH Aepb Commonly known as:  BREO ELLIPTA Inhale 1 puff into the lungs daily.   furosemide 40 MG tablet Commonly known as:  LASIX Take 1  tablet (40 mg total) by mouth daily.   hydrALAZINE 10 MG tablet Commonly known as:  APRESOLINE Take 1 tablet (10 mg total) by mouth 4 (four) times daily.   multivitamin tablet Take 1 tablet by mouth.   potassium chloride 10 MEQ tablet Commonly known as:  K-DUR Take 1 tablet (10 mEq total) by mouth daily.   umeclidinium bromide 62.5 MCG/INH Aepb Commonly known as:  INCRUSE ELLIPTA Inhale 1 puff into the lungs daily.       No orders of the defined types were placed in this encounter.   Immunization History  Administered Date(s) Administered  . Influenza Split 02/15/2013  . Influenza Whole 03/01/2012  . Influenza, High Dose Seasonal PF 02/20/2016  . Influenza,inj,Quad PF,6+ Mos 01/07/2015  . Influenza-Unspecified 02/05/2014    Social History   Tobacco Use  . Smoking status: Former Smoker    Packs/day: 1.00    Years: 25.00    Pack years: 25.00    Types: Cigarettes    Last attempt to quit: 11/30/2007    Years since quitting: 10.0  . Smokeless tobacco: Never Used  Substance Use Topics  . Alcohol use: No    Family history is   Family History  Problem Relation Age of Onset  . COPD Father       Review of Systems  DATA OBTAINED: from patient, nurse GENERAL:  no fevers, fatigue, appetite changes SKIN: No itching, or rash EYES: No eye pain, redness, discharge EARS: No earache, tinnitus, change in hearing NOSE: No congestion, drainage or bleeding  MOUTH/THROAT: No mouth or tooth pain, No sore throat RESPIRATORY: No cough, wheezing, SOB CARDIAC: No chest pain, palpitations, lower extremity edema  GI: No abdominal pain, No N/V/D or constipation, No heartburn or reflux  GU: No dysuria, frequency or urgency, or incontinence  MUSCULOSKELETAL: No unrelieved bone/joint pain NEUROLOGIC: No headache, dizziness or focal weakness PSYCHIATRIC: No c/o anxiety or sadness   Vitals:   12/19/17 1424  BP: 103/64  Pulse: 89  Resp: 18  Temp: 97.9 F (36.6 C)    SpO2  Readings from Last 1 Encounters:  12/14/17 96%   Body mass index is 25.99 kg/m.     Physical Exam  GENERAL APPEARANCE: Alert, conversant,  No acute distress.  SKIN: No diaphoresis rash HEAD: Normocephalic, atraumatic  EYES: Conjunctiva/lids clear. Pupils round, reactive. EOMs intact.  EARS: External exam WNL, canals clear. Hearing grossly normal.  NOSE: No deformity or discharge.  MOUTH/THROAT: Lips w/o lesions  RESPIRATORY: Breathing is even, unlabored. Lung sounds are clear   CARDIOVASCULAR: Heart RRR no murmurs, rubs or gallops. No peripheral edema.   GASTROINTESTINAL: Abdomen is soft, non-tender, not distended w/ normal bowel sounds. GENITOURINARY: Bladder non tender, not distended  MUSCULOSKELETAL: No abnormal joints or musculature NEUROLOGIC:  Cranial nerves 2-12 grossly intact. Moves all extremities  PSYCHIATRIC: Mood and affect appropriate to situation, no behavioral  issues  Patient Active Problem List   Diagnosis Date Noted  . Acute on chronic combined systolic and diastolic CHF (congestive heart failure) ([place])   . Paroxysmal atrial fibrillation ([facility])   . Acute on chronic renal insufficiency 12/11/2017  . Bilateral lower extremity edema 12/11/2017  . Systolic congestive heart failure ([place]) 12/11/2017  . Dyspnea and respiratory abnormalities 12/10/2017  . Acute on chronic respiratory failure with hypoxia ([place]) 12/10/2017  . Dilated cardiomyopathy ([place]) 11/20/2017  . Pulmonary hypertension, unspecified ([place]) 11/20/2017  . Mitral regurgitation 11/20/2017  . Congestive heart failure ([place]) 11/20/2017  . Arthritis pain, hand 09/07/2017  . Type 2 diabetes mellitus with neurological complications ([place]) 14/78/2956  . Chronic respiratory failure ([place]) 06/22/2016  . Tachycardia 11/04/2014  . Edema 10/08/2014  . COPD (chronic obstructive pulmonary disease) ([place]) 07/30/2012  . Bronchiectasis ([place]) 07/30/2012      Labs reviewed: Basic Metabolic Panel:    Component  Value Date/Time   NA 141 12/14/2017 0611   NA 142 11/29/2017 1155   K 4.9 12/14/2017 0611   CL 101 12/14/2017 0611   CO2 27 12/14/2017 0611   GLUCOSE 91 12/14/2017 0611   BUN 86 (H) 12/14/2017 0611   BUN 29 (H) 11/29/2017 1155   CREATININE 2.61 (H) 12/14/2017 0611   CREATININE 1.40 (H) 10/08/2014 0959   CALCIUM 9.8 12/14/2017 0611   PROT 7.3 12/11/2017 0232   ALBUMIN 3.0 (L) 12/11/2017 0232   AST 244 (H) 12/11/2017 0232   ALT 272 (H) 12/11/2017 0232   ALKPHOS 145 (H) 12/11/2017 0232   BILITOT 2.4 (H) 12/11/2017 0232   GFRNONAA 21 (L) 12/14/2017 0611   GFRNONAA 48 (L) 10/08/2014 0959   GFRAA 25 (L) 12/14/2017 0611   GFRAA 55 (L) 10/08/2014 0959    Recent Labs    12/12/17 0334 12/13/17 0408 12/14/17 0611  NA 136 138 141  K 4.3 4.5 4.9  CL 98 100 101  CO2 22 23 27   GLUCOSE 145* 100* 91  BUN 90* 94* 86*  CREATININE 2.57* 2.68* 2.61*  CALCIUM 9.2 9.5 9.8   Liver Function Tests: Recent Labs    10/18/17 1053 12/10/17 1038 12/11/17 0232  AST 30 399* 244*  ALT 32 327* 272*  ALKPHOS 86 168* 145*  BILITOT 0.8 2.8* 2.4*  PROT 7.1 8.6* 7.3  ALBUMIN 4.1 3.8 3.0*   No results for input(s): LIPASE, AMYLASE in the last 8760 hours. No results for input(s): AMMONIA in the last 8760 hours. CBC: Recent Labs    12/10/17 1038  12/12/17 0334 12/13/17 0408 12/14/17 0611  WBC 8.8   < > 13.1* 12.4* 11.0*  NEUTROABS 7.7  --   --   --   --   HGB 14.6   < > 12.2* 12.4* 13.0  HCT 38.2*   < > 33.2* 33.6* 35.3*  MCV 73.9*   < > 76.0* 75.7* 75.1*  PLT 156   < > 168 190 182   < > = values in this interval not displayed.   Lipid No results for input(s): CHOL, HDL, LDLCALC, TRIG in the last 8760 hours.  Cardiac Enzymes: Recent Labs    12/10/17 1940 12/11/17 0232 12/11/17 0735  TROPONINI 0.83* 0.93* 0.78*   BNP: Recent Labs    12/10/17 1038  BNP 1,224.9*   No results found for: [facility] Main Lab Results  Component Value Date   HGBA1C 6.1 (H) 12/10/2017   Lab Results    Component Value Date   TSH 2.67 10/18/2017   Lab  Results  Component Value Date   VITAMINB12 805 10/18/2017   No results found for: FOLATE No results found for: IRON, TIBC, FERRITIN  Imaging and Procedures obtained prior to SNF admission: Ct Chest Wo Contrast  Result Date: 12/10/2017 CLINICAL DATA:  83 year old male hypoxic with oxygen saturation 86% on room air. EXAM: CT CHEST WITHOUT CONTRAST TECHNIQUE: Multidetector CT imaging of the chest was performed following the standard protocol without IV contrast. COMPARISON:  Chest CT 11/04/2014. FINDINGS: Cardiovascular: Heart size is borderline enlarged. There is no significant pericardial fluid, thickening or pericardial calcification. There is aortic atherosclerosis, as well as atherosclerosis of the great vessels of the mediastinum and the coronary arteries, including calcified atherosclerotic plaque in the left main, left anterior descending, left circumflex and right coronary arteries. Mediastinum/Nodes: No pathologically enlarged mediastinal or hilar lymph nodes. Please note that accurate exclusion of hilar adenopathy is limited on noncontrast CT scans. Esophagus is unremarkable in appearance. No axillary lymphadenopathy. Lungs/Pleura: Diffuse bronchial wall thickening with moderate centrilobular and paraseptal emphysema. Scattered areas of mild cylindrical bronchiectasis, most evident in the lower lobes of the lungs bilaterally (left greater than right). Extensive mucoid impaction within the left lower lobe where there is also thickening of the peribronchovascular interstitium as well as peribronchovascular airspace consolidation throughout the left lower lobe, concerning for sequela of recent aspiration and/or developing bronchopneumonia. Irregular areas of airspace consolidation are also noted in a peribronchovascular distribution in the dependent portion of the left upper lobe posteriorly. Right lung appears relatively clear. No pleural  effusions. A few scattered small pulmonary nodules are noted in the lungs bilaterally, largest of which is in the periphery of the right middle lobe (axial image 100 of series 4), unchanged. No larger more suspicious appearing pulmonary nodules or masses are noted. Upper Abdomen: Aortic atherosclerosis. Calcification in the inferior vena cava, similar to the prior examination, likely calcified chronic thrombus. Musculoskeletal: There are no aggressive appearing lytic or blastic lesions noted in the visualized portions of the skeleton. IMPRESSION: 1. Scattered areas of bronchiectasis in the lungs bilaterally, most evident in the lower lobes. In the dependent portions of the left lung there is extensive peribronchovascular airspace consolidation, concerning for sequela of recent aspiration or developing multilobar bronchopneumonia. 2. Diffuse bronchial wall thickening with moderate centrilobular and paraseptal emphysema; imaging findings suggestive of underlying COPD. 3. Multiple small pulmonary nodules measuring 5 mm or less in size, similar to the prior study from 2016, considered definitively benign. 4. Aortic atherosclerosis, in addition to left main and 3 vessel coronary artery disease. 5. Mild cardiomegaly. 6. Additional incidental findings, similar prior studies, as above. Aortic Atherosclerosis (ICD10-I70.0) and Emphysema (ICD10-J43.9). Electronically Signed   By: Vinnie Langton M.D.   On: 12/10/2017 21:59   Dg Chest Port 1 View  Result Date: 12/10/2017 CLINICAL DATA:  Shortness of breath. EXAM: PORTABLE CHEST 1 VIEW COMPARISON:  Chest x-ray dated 09/20/2017. FINDINGS: Heart size and mediastinal contours are stable. Lungs are hyperexpanded. There are new streaky opacities at the LEFT lung base suggesting pneumonia. No pleural effusion or pneumothorax seen. No acute or suspicious osseous finding. IMPRESSION: 1. New ill-defined/streaky opacities at the LEFT lung base, suspicious for pneumonia. 2.  Hyperexpanded lungs indicating COPD. Electronically Signed   By: Franki Cabot M.D.   On: 12/10/2017 11:03     Not all labs, radiology exams or other studies done during hospitalization come through on my EPIC note; however they are reviewed by me.    Assessment and Plan  Acute on chronic  respiratory failure/multi lobar pneumonia/COPD- treated with IV antibiotics for right middle lobe left lower lobe infiltrate; treated with BiPAP and able to wean down to OT fairly quickly; Rocephin and Zithromax was transitioned to Augmentin and Zithromax. SNF -OT/PT; continue Augmentin twice daily for 4 more days and Zithromax 250 mg daily for 4 more days  Chronic HF R EF/elevated liver functions- BNP was 1224 but no pulmonary edema noted no apparent volume overload; holding statins due to elevated liver functions, not on ACE/ARB due to CKD not on beta-blocker due to severe COPD SNF -10 you Lasix 40 mg daily  Paroxysmal atrial fib with RVR-treated with IV amiodarone and transition to normal sinus rhythm and placed on p.o. amiodarone; Eliquis was started; maintain K greater than 4 and mag greater than 2; last TSH was normal SNF -continue amiodarone 200 mg daily, Eliquis 2.5 mg twice daily  Acute kidney injury on chronic kidney disease stage IV-baseline said to be 2.14; avoid hypotension and nephrotoxins SNF -patient DC on Lasix 40 mg daily; follow-up BMP  QT prolongation-QTC persistently greater than 500-avoid provocative agents as able  Diabetes mellitus type II-A1c 6.1% SNF -patient will be on diet alone; CBG every morning x1 week:  Hypertension SNF -continue Lasix 40 mg daily and hydralazine 10 mg 4 times daily  Hypokalemia SNF -continue KCl 10 mEq daily; follow-up BMP  Osteoporosis SNF -fractures; continue calcium plus D5 100- 201 p.o. daily   Time spent greater than 45 minutes;> 50% of time with patient was spent reviewing records, labs, tests and studies, counseling and developing plan of  care  Webb Silversmith D. Eric Coil, MD

## 2017-12-19 NOTE — Progress Notes (Signed)
Opened in error

## 2017-12-19 NOTE — Progress Notes (Signed)
Location:  [place] Room Number: [phone] Place of Service:  SNF [phone])  Noah Delaine. Sheppard Coil, MD  Patient Care Team: Shelda Pal, DO as PCP - General (Family Medicine) Park Liter, MD as PCP - Cardiology (Cardiology)  Extended Emergency Contact Information Primary Emergency Contact: Sones,Hattie  [place] Phone: [phone] Relation: Spouse Secondary Emergency Contact: [place], [place], [place] [address] Johnnette Litter of [employer] Phone: [phone] Relation: Son  Allergies  Allergen Reactions  . Lisinopril Anaphylaxis and Swelling         Chief Complaint  Patient presents with  . Discharge Note    Discharge from [facility]    HPI:  83 y.o. male with COPD on home O2, intermittent CPAP, chronic heart failure with reduced ejection fraction, pulmonary hypertension, stage IV kidney disease, hypertension, hyperlipidemia, prostate cancer, DLBCL who presented to [facility] with shortness of breath for 4 days, and hypoxia on room air.  Patient was admitted to [facility] from 8/5-9 with right middle lobe and left lower lobe pneumonia, along with COPD.  Hospital course was complicated by the development of atrial fib which converted to normal sinus rhythm with amiodarone.  Patient was admitted to skilled nursing facility for OT/PT and now desires to go home.    Past Medical History:  Diagnosis Date  . Allergy   . Arthropathy   . CAD (coronary artery disease)   . Cardiomyopathy ([place])   . Chronic kidney disease   . Colon polyp   . COPD (chronic obstructive pulmonary disease) ([place])   . Diabetes ([place])   . Diabetic neuropathy ([place])   . DOE (dyspnea on exertion)   . Heart murmur   . Hypercholesterolemia   . Hypertension    pulmonary  . LVH (left ventricular hypertrophy)   . Lymphoma ([place])   . Mitral regurgitation   . Prostate cancer ([place])   . Pulmonary hypertension ([place])     . Tricuspid regurgitation     Past Surgical History:  Procedure Laterality Date  . HEMORROIDECTOMY     pt does not remember year     reports that he quit smoking about 10 years ago. His smoking use included cigarettes. He has a 25.00 pack-year smoking history. He has never used smokeless tobacco. He reports that he does not drink alcohol or use drugs. Social History   Socioeconomic History  . Marital status: Married    Spouse name: Not on file  . Number of children: Not on file  . Years of education: Not on file  . Highest education level: Not on file  Occupational History  . Not on file  Social Needs  . Financial resource strain: Not on file  . Food insecurity:    Worry: Not on file    Inability: Not on file  . Transportation needs:    Medical: Not on file    Non-medical: Not on file  Tobacco Use  . Smoking status: Former Smoker    Packs/day: 1.00    Years: 25.00    Pack years: 25.00    Types: Cigarettes    Last attempt to quit: 11/30/2007    Years since quitting: 10.0  . Smokeless tobacco: Never Used  Substance and Sexual Activity  . Alcohol use: No  . Drug use: No  . Sexual activity: Not on file  Lifestyle  . Physical activity:  Days per week: Not on file    Minutes per session: Not on file  . Stress: Not on file  Relationships  . Social connections:    Talks on phone: Not on file    Gets together: Not on file    Attends religious service: Not on file    Active member of club or organization: Not on file    Attends meetings of clubs or organizations: Not on file    Relationship status: Not on file  . Intimate partner violence:    Fear of current or ex partner: Not on file    Emotionally abused: Not on file    Physically abused: Not on file    Forced sexual activity: Not on file  Other Topics Concern  . Not on file  Social History Narrative  . Not on file    Pertinent  Health Maintenance Due  Topic Date Due  . FOOT EXAM  08/05/1944  .  OPHTHALMOLOGY EXAM  08/05/1944  . PNA vac Low Risk Adult (1 of 2 - PCV13) 08/04/2001  . INFLUENZA VACCINE  12/06/2017  . HEMOGLOBIN A1C  06/12/2018    Medications: Allergies as of 12/19/2017      Reactions   Lisinopril Anaphylaxis, Swelling         Medication List        Accurate as of 12/19/17 11:59 PM. Always use your most recent med list.          albuterol (2.5 MG/3ML) 0.083% nebulizer solution Commonly known as:  PROVENTIL Take 3 mLs (2.5 mg total) by nebulization 2 (two) times daily.   albuterol 108 (90 Base) MCG/ACT inhaler Commonly known as:  PROVENTIL HFA;VENTOLIN HFA Inhale 2 puffs into the lungs 2 (two) times daily.   amiodarone 200 MG tablet Commonly known as:  PACERONE Take 1 tablet (200 mg total) by mouth daily.   apixaban 2.5 MG Tabs tablet Commonly known as:  ELIQUIS Take 1 tablet (2.5 mg total) by mouth 2 (two) times daily.   aspirin 81 MG tablet Take 81 mg by mouth daily.   calcium-vitamin D 500-200 MG-UNIT tablet Take 1 tablet by mouth daily.   fluticasone furoate-vilanterol 100-25 MCG/INH Aepb Commonly known as:  BREO ELLIPTA Inhale 1 puff into the lungs daily.   furosemide 40 MG tablet Commonly known as:  LASIX Take 1 tablet (40 mg total) by mouth daily.   hydrALAZINE 10 MG tablet Commonly known as:  APRESOLINE Take 1 tablet (10 mg total) by mouth 4 (four) times daily.   multivitamin tablet Take 1 tablet by mouth.   potassium chloride 10 MEQ tablet Commonly known as:  K-DUR Take 1 tablet (10 mEq total) by mouth daily.   umeclidinium bromide 62.5 MCG/INH Aepb Commonly known as:  INCRUSE ELLIPTA Inhale 1 puff into the lungs daily.        Vitals:   12/19/17 1257  BP: 102/69  Pulse: 84  Resp: 16  Temp: (!) 96.8 F (36 C)  Weight: 150 lb 3.2 oz (68.1 kg)  Height: 5\' 6"  (1.676 m)   Body mass index is 24.24 kg/m.  Physical Exam  GENERAL APPEARANCE: Alert, conversant. No acute distress.  HEENT:  Unremarkable. RESPIRATORY: Breathing is even, unlabored. Lung sounds are clear   CARDIOVASCULAR: Heart RRR no murmurs, rubs or gallops. No peripheral edema.  GASTROINTESTINAL: Abdomen is soft, non-tender, not distended w/ normal bowel sounds.  NEUROLOGIC: Cranial nerves 2-12 grossly intact. Moves all extremities   Labs reviewed: Basic Metabolic Panel: Recent  Labs    12/12/17 0334 12/13/17 0408 12/14/17 0611  NA 136 138 141  K 4.3 4.5 4.9  CL 98 100 101  CO2 22 23 27   GLUCOSE 145* 100* 91  BUN 90* 94* 86*  CREATININE 2.57* 2.68* 2.61*  CALCIUM 9.2 9.5 9.8   No results found for: [facility] Liver Function Tests: Recent Labs    10/18/17 1053 12/10/17 1038 12/11/17 0232  AST 30 399* 244*  ALT 32 327* 272*  ALKPHOS 86 168* 145*  BILITOT 0.8 2.8* 2.4*  PROT 7.1 8.6* 7.3  ALBUMIN 4.1 3.8 3.0*   No results for input(s): LIPASE, AMYLASE in the last 8760 hours. No results for input(s): AMMONIA in the last 8760 hours. CBC: Recent Labs    12/10/17 1038  12/12/17 0334 12/13/17 0408 12/14/17 0611  WBC 8.8   < > 13.1* 12.4* 11.0*  NEUTROABS 7.7  --   --   --   --   HGB 14.6   < > 12.2* 12.4* 13.0  HCT 38.2*   < > 33.2* 33.6* 35.3*  MCV 73.9*   < > 76.0* 75.7* 75.1*  PLT 156   < > 168 190 182   < > = values in this interval not displayed.   Lipid No results for input(s): CHOL, HDL, LDLCALC, TRIG in the last 8760 hours. Cardiac Enzymes: Recent Labs    12/10/17 1940 12/11/17 0232 12/11/17 0735  TROPONINI 0.83* 0.93* 0.78*   BNP: Recent Labs    12/10/17 1038  BNP 1,224.9*   CBG: Recent Labs    12/13/17 2006 12/14/17 0801 12/14/17 1211  GLUCAP 94 84 118*    Procedures and Imaging Studies During Stay: Ct Chest Wo Contrast  Result Date: 12/10/2017 CLINICAL DATA:  83 year old male hypoxic with oxygen saturation 86% on room air. EXAM: CT CHEST WITHOUT CONTRAST TECHNIQUE: Multidetector CT imaging of the chest was performed following the standard protocol  without IV contrast. COMPARISON:  Chest CT 11/04/2014. FINDINGS: Cardiovascular: Heart size is borderline enlarged. There is no significant pericardial fluid, thickening or pericardial calcification. There is aortic atherosclerosis, as well as atherosclerosis of the great vessels of the mediastinum and the coronary arteries, including calcified atherosclerotic plaque in the left main, left anterior descending, left circumflex and right coronary arteries. Mediastinum/Nodes: No pathologically enlarged mediastinal or hilar lymph nodes. Please note that accurate exclusion of hilar adenopathy is limited on noncontrast CT scans. Esophagus is unremarkable in appearance. No axillary lymphadenopathy. Lungs/Pleura: Diffuse bronchial wall thickening with moderate centrilobular and paraseptal emphysema. Scattered areas of mild cylindrical bronchiectasis, most evident in the lower lobes of the lungs bilaterally (left greater than right). Extensive mucoid impaction within the left lower lobe where there is also thickening of the peribronchovascular interstitium as well as peribronchovascular airspace consolidation throughout the left lower lobe, concerning for sequela of recent aspiration and/or developing bronchopneumonia. Irregular areas of airspace consolidation are also noted in a peribronchovascular distribution in the dependent portion of the left upper lobe posteriorly. Right lung appears relatively clear. No pleural effusions. A few scattered small pulmonary nodules are noted in the lungs bilaterally, largest of which is in the periphery of the right middle lobe (axial image 100 of series 4), unchanged. No larger more suspicious appearing pulmonary nodules or masses are noted. Upper Abdomen: Aortic atherosclerosis. Calcification in the inferior vena cava, similar to the prior examination, likely calcified chronic thrombus. Musculoskeletal: There are no aggressive appearing lytic or blastic lesions noted in the visualized  portions of the skeleton.  IMPRESSION: 1. Scattered areas of bronchiectasis in the lungs bilaterally, most evident in the lower lobes. In the dependent portions of the left lung there is extensive peribronchovascular airspace consolidation, concerning for sequela of recent aspiration or developing multilobar bronchopneumonia. 2. Diffuse bronchial wall thickening with moderate centrilobular and paraseptal emphysema; imaging findings suggestive of underlying COPD. 3. Multiple small pulmonary nodules measuring 5 mm or less in size, similar to the prior study from 2016, considered definitively benign. 4. Aortic atherosclerosis, in addition to left main and 3 vessel coronary artery disease. 5. Mild cardiomegaly. 6. Additional incidental findings, similar prior studies, as above. Aortic Atherosclerosis (ICD10-I70.0) and Emphysema (ICD10-J43.9). Electronically Signed   By: Vinnie Langton M.D.   On: 12/10/2017 21:59   Dg Chest Port 1 View  Result Date: 12/10/2017 CLINICAL DATA:  Shortness of breath. EXAM: PORTABLE CHEST 1 VIEW COMPARISON:  Chest x-ray dated 09/20/2017. FINDINGS: Heart size and mediastinal contours are stable. Lungs are hyperexpanded. There are new streaky opacities at the LEFT lung base suggesting pneumonia. No pleural effusion or pneumothorax seen. No acute or suspicious osseous finding. IMPRESSION: 1. New ill-defined/streaky opacities at the LEFT lung base, suspicious for pneumonia. 2. Hyperexpanded lungs indicating COPD. Electronically Signed   By: Franki Cabot M.D.   On: 12/10/2017 11:03    Assessment/Plan:   Acute on chronic respiratory failure with hypoxia (HCC)  Centrilobular emphysema (HCC)  Community acquired pneumonia of right middle lobe of lung ([place])  Chronic systolic heart failure (HCC)  Transaminitis  Paroxysmal atrial fibrillation with RVR ([place])  Acute kidney injury superimposed on chronic kidney disease (HCC)  QT prolongation  Hypertensive heart disease with heart  failure (HCC)  Hypokalemia  Age-related osteoporosis without current pathological fracture  Type 2 diabetes mellitus with neurological complications ([place])   Patient is being discharged with the following home health services:  OT/PT/Nursing  Patient is being discharged with the following durable medical equipment:  Stationary and portable gaseous O2 via nasal cannula@2L , rolling walker  Patient has been advised to f/u with their PCP in 1-2 weeks to bring them up to date on their rehab stay.  Social services at facility was responsible for arranging this appointment.  Pt was provided with a 30 day supply of prescriptions for medications and refills must be obtained from their PCP.  For controlled substances, a more limited supply may be provided adequate until PCP appointment only.   Medications have been reconciled/  Time spent greater than 30 minutes;> 50% of time with patient was spent reviewing records, labs, tests and studies, counseling and developing plan of care  Inocencio Homes, MD

## 2017-12-23 ENCOUNTER — Encounter: Payer: Self-pay | Admitting: Internal Medicine

## 2017-12-23 DIAGNOSIS — J189 Pneumonia, unspecified organism: Secondary | ICD-10-CM | POA: Insufficient documentation

## 2017-12-23 DIAGNOSIS — E889 Metabolic disorder, unspecified: Secondary | ICD-10-CM

## 2017-12-23 DIAGNOSIS — I5020 Unspecified systolic (congestive) heart failure: Secondary | ICD-10-CM | POA: Insufficient documentation

## 2017-12-23 DIAGNOSIS — I110 Hypertensive heart disease with heart failure: Secondary | ICD-10-CM | POA: Insufficient documentation

## 2017-12-23 DIAGNOSIS — N189 Chronic kidney disease, unspecified: Secondary | ICD-10-CM

## 2017-12-23 DIAGNOSIS — R9431 Abnormal electrocardiogram [ECG] [EKG]: Secondary | ICD-10-CM | POA: Insufficient documentation

## 2017-12-23 DIAGNOSIS — N179 Acute kidney failure, unspecified: Secondary | ICD-10-CM | POA: Insufficient documentation

## 2017-12-23 DIAGNOSIS — R7401 Elevation of levels of liver transaminase levels: Secondary | ICD-10-CM | POA: Insufficient documentation

## 2017-12-23 DIAGNOSIS — M810 Age-related osteoporosis without current pathological fracture: Secondary | ICD-10-CM | POA: Insufficient documentation

## 2017-12-23 DIAGNOSIS — E876 Hypokalemia: Secondary | ICD-10-CM | POA: Insufficient documentation

## 2017-12-23 DIAGNOSIS — R740 Nonspecific elevation of levels of transaminase and lactic acid dehydrogenase [LDH]: Secondary | ICD-10-CM

## 2017-12-23 DIAGNOSIS — I480 Paroxysmal atrial fibrillation: Secondary | ICD-10-CM | POA: Insufficient documentation

## 2017-12-23 DIAGNOSIS — M898X9 Other specified disorders of bone, unspecified site: Secondary | ICD-10-CM | POA: Insufficient documentation

## 2017-12-23 HISTORY — DX: Other specified disorders of bone, unspecified site: M89.8X9

## 2017-12-23 HISTORY — DX: Metabolic disorder, unspecified: E88.9

## 2017-12-24 ENCOUNTER — Inpatient Hospital Stay: Payer: Medicare HMO | Admitting: Nurse Practitioner

## 2017-12-25 ENCOUNTER — Telehealth: Payer: Self-pay | Admitting: Family Medicine

## 2017-12-25 ENCOUNTER — Telehealth: Payer: Self-pay | Admitting: *Deleted

## 2017-12-25 NOTE — Telephone Encounter (Signed)
Copied from [place] [id number]. Topic: Quick Communication - See Telephone Encounter >> Dec 25, 2017  1:02 PM Rutherford Nail, [place] wrote: CRM for notification. See Telephone encounter for: 12/25/17. Lori with [facility] calling and would like verbal orders for nursing for pneumonia and a-fib management 1x a week for 3 weeks CB#: [phone]

## 2017-12-25 NOTE — Telephone Encounter (Signed)
Left message on patient's home phone per DPR informing him that he is overdue for labs to be drawn. Advised him to have this completed before follow up visit on 01/02/18. Informed him to contact our office with further questions or concerns.

## 2017-12-26 ENCOUNTER — Ambulatory Visit: Payer: Medicare HMO | Admitting: Family Medicine

## 2017-12-26 ENCOUNTER — Telehealth: Payer: Self-pay | Admitting: Family Medicine

## 2017-12-26 NOTE — Telephone Encounter (Signed)
OK 

## 2017-12-26 NOTE — Telephone Encounter (Signed)
Copied from [place] [phone]. Topic: Quick Communication - See Telephone Encounter >> Dec 26, 2017  4:03 PM Burchel, Abbi R wrote: CRM for notification. See Telephone encounter for: 12/26/17.  Ritu ( [place]) requesting v/o for OT 2*2wk  and 1*1wk  Ritu: [phone] ok to leave VM

## 2017-12-26 NOTE — Telephone Encounter (Signed)
Called [facility] informed of verbal PCP ok.

## 2017-12-26 NOTE — Telephone Encounter (Signed)
HHRN informed of ok

## 2017-12-27 ENCOUNTER — Inpatient Hospital Stay: Payer: Medicare HMO | Admitting: Family Medicine

## 2017-12-27 ENCOUNTER — Encounter: Payer: Self-pay | Admitting: Family Medicine

## 2017-12-27 ENCOUNTER — Ambulatory Visit (INDEPENDENT_AMBULATORY_CARE_PROVIDER_SITE_OTHER): Payer: Medicare HMO | Admitting: Family Medicine

## 2017-12-27 VITALS — BP 120/76 | HR 80 | Temp 98.1°F | Ht 66.0 in | Wt 152.2 lb

## 2017-12-27 DIAGNOSIS — J181 Lobar pneumonia, unspecified organism: Secondary | ICD-10-CM

## 2017-12-27 DIAGNOSIS — R740 Nonspecific elevation of levels of transaminase and lactic acid dehydrogenase [LDH]: Secondary | ICD-10-CM | POA: Diagnosis not present

## 2017-12-27 DIAGNOSIS — I700 Atherosclerosis of aorta: Secondary | ICD-10-CM | POA: Diagnosis not present

## 2017-12-27 DIAGNOSIS — R7401 Elevation of levels of liver transaminase levels: Secondary | ICD-10-CM

## 2017-12-27 DIAGNOSIS — J189 Pneumonia, unspecified organism: Secondary | ICD-10-CM

## 2017-12-27 MED ORDER — APIXABAN 2.5 MG PO TABS: 2.5000 mg | ORAL_TABLET | Freq: Two times a day (BID) | ORAL | 0 refills | Status: DC

## 2017-12-27 MED ORDER — AMIODARONE HCL 200 MG PO TABS: 200.0000 mg | ORAL_TABLET | Freq: Every day | ORAL | 0 refills | Status: DC

## 2017-12-27 NOTE — Addendum Note (Signed)
Addended by: Caffie Pinto on: 12/27/2017 03:03 PM   Modules accepted: Orders

## 2017-12-27 NOTE — Progress Notes (Signed)
Pre visit review using our clinic review tool, if applicable. No additional management support is needed unless otherwise documented below in the visit note. 

## 2017-12-27 NOTE — Progress Notes (Signed)
Chief Complaint  Patient presents with  . Hospitalization Follow-up    HPI Eric Herring is a 83 y.o. y.o. male who presents for a transition of care visit.  Pt was discharged from [facility] on 12-14-17.  Within 48 business hours of discharge our office contacted him via telephone to coordinate his care and needs.   Patient was admitted from 8/5-8/9 for community-acquired pneumonia.  He was placed antibiotic therapy and as such finished his course.  He reports feeling much better since discharge.  He stopped using oxygen this morning.  He is no longer short of breath when physically exerting himself.  He denies any fevers or lingering cough.  He was diagnosed with transaminitis and his pravastatin was held while in the hospital.  He is here to have his labs rechecked today as well.  He was told by staff in the hospital that he might not need to be on hydralazine.  This was prescribed by his cardiologist and I will defer to him for further recommendation.   Past Medical History:  Diagnosis Date  . Allergy   . Aortic atherosclerosis ([place])   . Arthropathy   . CAD (coronary artery disease)   . Cardiomyopathy ([name])   . Chronic kidney disease   . Colon polyp   . COPD (chronic obstructive pulmonary disease) ([place])   . Diabetes ([place])   . Diabetic neuropathy ([place])   . Heart murmur   . Hypercholesterolemia   . Hypertension    pulmonary  . LVH (left ventricular hypertrophy)   . Lymphoma ([place])   . Mitral regurgitation   . Prostate cancer ([place])   . Pulmonary hypertension ([place])   . Tricuspid regurgitation    Family History  Problem Relation Age of Onset  . COPD Father    Allergies as of 12/27/2017      Reactions   Lisinopril Anaphylaxis, Swelling         Medication List        Accurate as of 12/27/17 10:08 AM. Always use your most recent med list.          albuterol (2.5 MG/3ML) 0.083% nebulizer solution Commonly known as:  PROVENTIL Take 3 mLs (2.5 mg total) by  nebulization 2 (two) times daily.   albuterol 108 (90 Base) MCG/ACT inhaler Commonly known as:  PROVENTIL HFA;VENTOLIN HFA Inhale 2 puffs into the lungs 2 (two) times daily.   amiodarone 200 MG tablet Commonly known as:  PACERONE Take 1 tablet (200 mg total) by mouth daily.   apixaban 2.5 MG Tabs tablet Commonly known as:  ELIQUIS Take 1 tablet (2.5 mg total) by mouth 2 (two) times daily.   aspirin 81 MG tablet Take 81 mg by mouth daily.   calcium-vitamin D 500-200 MG-UNIT tablet Take 1 tablet by mouth daily.   fluticasone furoate-vilanterol 100-25 MCG/INH Aepb Commonly known as:  BREO ELLIPTA Inhale 1 puff into the lungs daily.   furosemide 40 MG tablet Commonly known as:  LASIX Take 1 tablet (40 mg total) by mouth daily.   hydrALAZINE 10 MG tablet Commonly known as:  APRESOLINE Take 1 tablet (10 mg total) by mouth 4 (four) times daily.   multivitamin tablet Take 1 tablet by mouth.   potassium chloride 10 MEQ tablet Commonly known as:  K-DUR Take 1 tablet (10 mEq total) by mouth daily.   umeclidinium bromide 62.5 MCG/INH Aepb Commonly known as:  INCRUSE ELLIPTA Inhale 1 puff into the lungs daily.  ROS:  Constitutional: No fevers or chills, no weight loss HEENT: No headaches, hearing loss, or runny nose, no sore throat Heart: No chest pain Lungs: No SOB, no cough Abd: No bowel changes, no pain, no N/V GU: No urinary complaints Neuro: No numbness, tingling or weakness Msk: No joint or muscle pain  Objective BP 120/76 (BP Location: Right Arm, Patient Position: Sitting, Cuff Size: Normal)   Pulse 80   Temp 98.1 F (36.7 C) (Oral)   Ht 5\' 6"  (1.676 m)   Wt 152 lb 4 oz (69.1 kg)   SpO2 94%   BMI 24.57 kg/m  General Appearance:  awake, alert, oriented, in no acute distress and well developed, well nourished Skin:  there are no suspicious lesions or rashes of concern Head/face:  NCAT Eyes:  EOMI, PERRLA Ears:  canals and TMs NI  Nose/Sinuses:   negative Mouth/Throat:  Mucosa moist, no lesions; pharynx without erythema, edema or exudate. Neck:  neck- supple, no mass, non-tender and no jvd Lungs: Clear to auscultation.  No rales, rhonchi, or wheezing. Normal effort, no accessory muscle use. Heart:  Heart sounds are normal.  Regular rate and rhythm without murmur, gallop or rub. No bruits. Abdomen:  BS+, soft, NT, ND, no masses or organomegaly Musculoskeletal:  No muscle group atrophy or asymmetry, gait normal Neurologic:  Alert and oriented x 3, gait normal. Psych exam: Nml mood and affect, age appropriate judgment and insight  Community acquired pneumonia of right middle lobe of lung ([place]) - Plan: CBC, Comprehensive metabolic panel  Aortic atherosclerosis (HCC)  Transaminitis - Plan: Comprehensive metabolic panel  Discharge summary and medication list have been reviewed/reconciled.  Labs pending at the time of discharge have been reviewed or are still pending at the time of this visit.  Follow-up labs and appointments have been ordered and/or coordinated appropriately. Educational materials regarding the patient's admitting diagnosis provided.  TRANSITIONAL CARE MANAGEMENT CERTIFICATION:  I certify the following are true:   1. Communication with the patient/care giver was made within 2 business days of discharge.  2. Complexity of Medical decision making is moderate.  3. Face to face visit occurred within 14 days of discharge.    F/u 6 mo. The patient voiced understanding and agreement to the plan.  [place], DO 12/27/17 10:08 AM

## 2017-12-27 NOTE — Patient Instructions (Signed)
Give [place] 2-3 business days to get the results of your labs back.   Keep your appointment with the lung doctor. Ask. Dr. Raliegh Ip about Hydralazine as he is the one who prescribed it.  Let us know if you need anything.

## 2017-12-28 ENCOUNTER — Other Ambulatory Visit (INDEPENDENT_AMBULATORY_CARE_PROVIDER_SITE_OTHER): Payer: Medicare HMO

## 2017-12-28 DIAGNOSIS — R740 Nonspecific elevation of levels of transaminase and lactic acid dehydrogenase [LDH]: Secondary | ICD-10-CM | POA: Diagnosis not present

## 2017-12-28 DIAGNOSIS — I700 Atherosclerosis of aorta: Secondary | ICD-10-CM

## 2017-12-28 DIAGNOSIS — J189 Pneumonia, unspecified organism: Secondary | ICD-10-CM

## 2017-12-28 DIAGNOSIS — R7401 Elevation of levels of liver transaminase levels: Secondary | ICD-10-CM

## 2017-12-28 DIAGNOSIS — J181 Lobar pneumonia, unspecified organism: Secondary | ICD-10-CM

## 2017-12-28 LAB — COMPREHENSIVE METABOLIC PANEL
ALBUMIN: 3.8 g/dL (ref 3.5–5.2)
ALK PHOS: 132 U/L — AB (ref 39–117)
ALT: 65 U/L — ABNORMAL HIGH (ref 0–53)
AST: 42 U/L — ABNORMAL HIGH (ref 0–37)
BUN: 37 mg/dL — AB (ref 6–23)
CO2: 30 mEq/L (ref 19–32)
CREATININE: 2.4 mg/dL — AB (ref 0.40–1.50)
Calcium: 9.9 mg/dL (ref 8.4–10.5)
Chloride: 99 mEq/L (ref 96–112)
GFR: 33.55 mL/min — ABNORMAL LOW (ref >60.00)
Glucose, Bld: 74 mg/dL (ref 70–99)
POTASSIUM: 4.5 meq/L (ref 3.5–5.1)
SODIUM: 135 meq/L (ref 135–145)
TOTAL PROTEIN: 7.7 g/dL (ref 6.0–8.3)
Total Bilirubin: 0.9 mg/dL (ref 0.2–1.2)

## 2017-12-28 LAB — CBC
HCT: 40.2 % (ref 39.0–52.0)
Hemoglobin: 13.6 g/dL (ref 13.0–17.0)
MCHC: 33.8 g/dL (ref 30.0–36.0)
MCV: 82 fl (ref 78.0–100.0)
Platelets: 197 10*3/uL (ref 150.0–400.0)
RBC: 4.9 Mil/uL (ref 4.22–5.81)
RDW: 16.5 % — AB (ref 11.5–15.5)
WBC: 3.9 10*3/uL — AB (ref 4.0–10.5)

## 2017-12-31 ENCOUNTER — Other Ambulatory Visit: Payer: Self-pay | Admitting: Family Medicine

## 2017-12-31 DIAGNOSIS — R740 Nonspecific elevation of levels of transaminase and lactic acid dehydrogenase [LDH]: Principal | ICD-10-CM

## 2017-12-31 DIAGNOSIS — R7401 Elevation of levels of liver transaminase levels: Secondary | ICD-10-CM

## 2018-01-02 ENCOUNTER — Ambulatory Visit: Payer: Medicare HMO | Admitting: Cardiology

## 2018-01-02 ENCOUNTER — Encounter: Payer: Self-pay | Admitting: Cardiology

## 2018-01-02 VITALS — BP 114/66 | HR 98 | Ht 66.0 in | Wt 153.1 lb

## 2018-01-02 DIAGNOSIS — I5022 Chronic systolic (congestive) heart failure: Secondary | ICD-10-CM

## 2018-01-02 DIAGNOSIS — I480 Paroxysmal atrial fibrillation: Secondary | ICD-10-CM

## 2018-01-02 DIAGNOSIS — J432 Centrilobular emphysema: Secondary | ICD-10-CM | POA: Diagnosis not present

## 2018-01-02 NOTE — Progress Notes (Signed)
Cardiology Office Note:    Date:  01/02/2018   ID:  Eric Herring, DOB [birth year], MRN [id number]  PCP:  Shelda Pal, DO  Cardiologist:  Jenne Campus, MD    Referring MD: Shelda Pal*   Chief Complaint  Patient presents with  . Follow-up    1 month follow   I was in the hospital  History of Present Illness:    Eric Herring is a 83 y.o. male with systolic congestive heart failure.  His ejection fraction 30 to 35% recently he had pneumonia he ended up being admitted to the hospital was treated for pneumonia as well as congestive heart failure while in the hospital he had atrial fibrillation converted with amiodarone now he is anticoagulated doing well he said he can do stuff at home and he looks very good denies have any chest pain tightness squeezing pressure burning chest exercise will bring shortness of breath no swelling no proximal nocturnal dyspnea.  Past Medical History:  Diagnosis Date  . Allergy   . Aortic atherosclerosis ([place])   . Arthropathy   . CAD (coronary artery disease)   . Cardiomyopathy ([place])   . Chronic kidney disease   . Colon polyp   . COPD (chronic obstructive pulmonary disease) ([place])   . Diabetes ([place])   . Diabetic neuropathy ([place])   . Heart murmur   . Hypercholesterolemia   . Hypertension    pulmonary  . LVH (left ventricular hypertrophy)   . Lymphoma ([place])   . Mitral regurgitation   . Prostate cancer ([place])   . Pulmonary hypertension ([place])   . Tricuspid regurgitation     Past Surgical History:  Procedure Laterality Date  . HEMORROIDECTOMY     pt does not remember year    Current Medications: Current Meds  Medication Sig  . albuterol (PROVENTIL HFA;VENTOLIN HFA) 108 (90 BASE) MCG/ACT inhaler Inhale 2 puffs into the lungs 2 (two) times daily.  Marland Kitchen albuterol (PROVENTIL) (2.5 MG/3ML) 0.083% nebulizer solution Take 3 mLs (2.5 mg total) by nebulization 2 (two) times daily.  Marland Kitchen amiodarone (PACERONE) 200 MG tablet Take  1 tablet (200 mg total) by mouth daily.  Marland Kitchen apixaban (ELIQUIS) 2.5 MG TABS tablet Take 1 tablet (2.5 mg total) by mouth 2 (two) times daily.  Marland Kitchen aspirin 81 MG tablet Take 81 mg by mouth daily.  . Calcium Carb-Cholecalciferol (CALCIUM-VITAMIN D) 500-200 MG-UNIT tablet Take 1 tablet by mouth daily.   . fluticasone furoate-vilanterol (BREO ELLIPTA) 100-25 MCG/INH AEPB Inhale 1 puff into the lungs daily.  . furosemide (LASIX) 40 MG tablet Take 1 tablet (40 mg total) by mouth daily.  . Multiple Vitamin (MULTIVITAMIN) tablet Take 1 tablet by mouth.   . potassium chloride (K-DUR) 10 MEQ tablet Take 1 tablet (10 mEq total) by mouth daily.  Marland Kitchen umeclidinium bromide (INCRUSE ELLIPTA) 62.5 MCG/INH AEPB Inhale 1 puff into the lungs daily.     Allergies:   Lisinopril   Social History   Socioeconomic History  . Marital status: Married    Spouse name: Not on file  . Number of children: Not on file  . Years of education: Not on file  . Highest education level: Not on file  Occupational History  . Not on file  Social Needs  . Financial resource strain: Not on file  . Food insecurity:    Worry: Not on file    Inability: Not on file  . Transportation needs:    Medical: Not on file  Non-medical: Not on file  Tobacco Use  . Smoking status: Former Smoker    Packs/day: 1.00    Years: 25.00    Pack years: 25.00    Types: Cigarettes    Last attempt to quit: 11/30/2007    Years since quitting: 10.0  . Smokeless tobacco: Never Used  Substance and Sexual Activity  . Alcohol use: No  . Drug use: No  . Sexual activity: Not on file  Lifestyle  . Physical activity:    Days per week: Not on file    Minutes per session: Not on file  . Stress: Not on file  Relationships  . Social connections:    Talks on phone: Not on file    Gets together: Not on file    Attends religious service: Not on file    Active member of club or organization: Not on file    Attends meetings of clubs or organizations: Not on  file    Relationship status: Not on file  Other Topics Concern  . Not on file  Social History Narrative  . Not on file     Family History: The patient's family history includes COPD in his father. ROS:   Please see the history of present illness.    All 14 point review of systems negative except as described per history of present illness  EKGs/Labs/Other Studies Reviewed:      Recent Labs: 10/18/2017: TSH 2.67 11/29/2017: NT-Pro BNP 4,831 12/10/2017: B Natriuretic Peptide 1,224.9 12/28/2017: ALT 65; BUN 37; Creatinine, Ser 2.40; Hemoglobin 13.6; Platelets 197.0; Potassium 4.5; Sodium 135  Recent Lipid Panel No results found for: CHOL, TRIG, HDL, CHOLHDL, VLDL, LDLCALC, LDLDIRECT  Physical Exam:    VS:  BP 114/66 (BP Location: Right Arm)   Pulse 98   Ht 5\' 6"  (1.676 m)   Wt 153 lb 1.9 oz (69.5 kg)   BMI 24.71 kg/m     Wt Readings from Last 3 Encounters:  01/02/18 153 lb 1.9 oz (69.5 kg)  12/27/17 152 lb 4 oz (69.1 kg)  12/19/17 150 lb 3.2 oz (68.1 kg)     GEN:  Well nourished, well developed in no acute distress HEENT: Normal NECK: No JVD; No carotid bruits LYMPHATICS: No lymphadenopathy CARDIAC: RRR, systolic murmur grade 1/6 best heard at the apex., no rubs, no gallops RESPIRATORY:  Clear to auscultation without rales, wheezing or rhonchi  ABDOMEN: Soft, non-tender, non-distended MUSCULOSKELETAL:  No edema; No deformity  SKIN: Warm and dry LOWER EXTREMITIES: no swelling NEUROLOGIC:  Alert and oriented x 3 PSYCHIATRIC:  Normal affect   ASSESSMENT:    1. Paroxysmal atrial fibrillation (HCC)   2. Chronic systolic congestive heart failure ([place])   3. Centrilobular emphysema ([place])    PLAN:    In order of problems listed above:  1. Paroxysmal atrial fibrillation we will do EKG to confirm the rhythm sounds like he is in normal rhythm because his chads 2 vascular equals 3 we will continue with anticoagulation.  We will also continue with amiodarone at least for  now. 2. Chronic systolic congestive heart failure [place] Heart Association 2/3.  On appropriate medications the biggest obstacle is his kidney dysfunction I will ask him to have fasting lipid profile done today. 3. Emphysema that be followed by internal medicine team. 4. Status post pneumonia.  Recovered nicely and actually looks very good.   Medication Adjustments/Labs and Tests Ordered: Current medicines are reviewed at length with the patient today.  Concerns regarding medicines are  outlined above.  No orders of the defined types were placed in this encounter.  Medication changes: No orders of the defined types were placed in this encounter.   Signed, Park Liter, MD, [facility] 01/02/2018 4:27 PM    [place]

## 2018-01-02 NOTE — Patient Instructions (Signed)
Medication Instructions:  Your physician recommends that you continue on your current medications as directed. Please refer to the Current Medication list given to you today.   Labwork: Your physician recommends that you return for lab work today: BMP, ProBNP.  Testing/Procedures: You had an EKG today.   Follow-Up: Your physician wants you to follow-up in: 3 months. You will receive a reminder letter in the mail two months in advance. If you don't receive a letter, please call our office to schedule the follow-up appointment.   If you need a refill on your cardiac medications before your next appointment, please call your pharmacy.   Thank you for choosing [facility]! Robyne Peers, RN [phone]

## 2018-01-03 LAB — BASIC METABOLIC PANEL
BUN/Creatinine Ratio: 14 (ref 10–24)
BUN: 38 mg/dL — AB (ref 8–27)
CO2: 24 mmol/L (ref 20–29)
CREATININE: 2.64 mg/dL — AB (ref 0.76–1.27)
Calcium: 9.7 mg/dL (ref 8.6–10.2)
Chloride: 96 mmol/L (ref 96–106)
GFR calc Af Amer: 25 mL/min/{1.73_m2} — ABNORMAL LOW (ref >59)
GFR calc non Af Amer: 22 mL/min/{1.73_m2} — ABNORMAL LOW (ref >59)
GLUCOSE: 78 mg/dL (ref 65–99)
Potassium: 4.4 mmol/L (ref 3.5–5.2)
Sodium: 137 mmol/L (ref 134–144)

## 2018-01-03 LAB — PRO B NATRIURETIC PEPTIDE: NT-Pro BNP: 3493 pg/mL — ABNORMAL HIGH (ref 0–486)

## 2018-01-10 ENCOUNTER — Encounter: Payer: Self-pay | Admitting: Adult Health

## 2018-01-10 ENCOUNTER — Ambulatory Visit: Payer: Medicare HMO | Admitting: Adult Health

## 2018-01-10 ENCOUNTER — Ambulatory Visit (HOSPITAL_BASED_OUTPATIENT_CLINIC_OR_DEPARTMENT_OTHER)
Admission: RE | Admit: 2018-01-10 | Discharge: 2018-01-10 | Disposition: A | Discharge status: Home / Self Care | Payer: Medicare HMO | Source: Ambulatory Visit | Attending: Adult Health | Admitting: Adult Health

## 2018-01-10 VITALS — BP 118/70 | HR 93 | Ht 67.0 in | Wt 153.0 lb

## 2018-01-10 DIAGNOSIS — J189 Pneumonia, unspecified organism: Secondary | ICD-10-CM

## 2018-01-10 DIAGNOSIS — Z23 Encounter for immunization: Secondary | ICD-10-CM

## 2018-01-10 DIAGNOSIS — J9611 Chronic respiratory failure with hypoxia: Secondary | ICD-10-CM

## 2018-01-10 DIAGNOSIS — J432 Centrilobular emphysema: Secondary | ICD-10-CM | POA: Diagnosis not present

## 2018-01-10 DIAGNOSIS — J181 Lobar pneumonia, unspecified organism: Secondary | ICD-10-CM | POA: Diagnosis not present

## 2018-01-10 NOTE — Patient Instructions (Addendum)
Continue on BREO and INCRUSE daily . Rinse At bedtime  Use.  Continue on Oxygen with activity and At bedtime   Chest xray today .  Flu shot today  Follow up with Dr. Halford Chessman or [place] NP in 2-3 months and As needed   Please contact office for sooner follow up if symptoms do not improve or worsen or seek emergency care

## 2018-01-10 NOTE — Progress Notes (Signed)
@Patient  ID: Eric Herring, male    DOB: 1935-01-11, 83 y.o.   MRN: [id number]    Referring provider: Shelda Pal*  HPI: 83 year old male former smoker followed for COPD and bronchiectasis  Past medical history significant for lymphoma (2002), nasopharyngeal squamous cell carcinoma, prostate cancer 2011 and chronic kidney disease  TEST /Events  Previous pulmonary care with Dr. Verdie Mosher in [facility]. CT chest February 2014 showed resolution of left lower lobe consolidation.  Extensive changes of emphysema with bullae and blebs.  6 mm nodule stable in the right middle lobe bronchiectatic changes in the left lower lobe. Echo 10/2010 showed EF 60%, mild LVH, mild -moderate PHT  He quit smoking in 2009, about 30 Pyrs, was also exposed to sand dust in his occupation.   Spirometry 2014 - FEV1 of 34% (1.02) with FVC of 58% 2.32)  -completed pulm rehab    01/10/2018 Follow up : COPD , PNA , Post hosp follow up  Patient presents for a post hospital follow-up.  Patient was recently admitted for a pneumonia.  Last month.  CT chest showed multi lobar bronchopneumonia.  Viral panel was negative.  He did require initial BiPAP support.  Hospital stay was complicated by A. fib.  He was treated with heparin and amiodarone.  With conversion to normal sinus rhythm.  He was treated with IV antibiotics and discharged on azithromycin and Augmentin.  She was seen by speech therapy and felt to be a mild aspiration risk.  Since discharge patient is feeling better.  He has decreased cough and congestion.  He denies any hemoptysis chest pain orthopnea PND or increased leg swelling.  Patient had lost some weight.  Is starting to gain back a few pounds.  Appetite is starting to pick back up.  He is using Ensure at home. Was restarted on o2 at discharge Using O2 2lm with act and At bedtime  .    Allergies  Allergen Reactions  . Lisinopril Anaphylaxis and Swelling         Immunization History    Administered Date(s) Administered  . Influenza Split 02/15/2013  . Influenza Whole 03/01/2012  . Influenza, High Dose Seasonal PF 02/20/2016  . Influenza,inj,Quad PF,6+ Mos 01/07/2015  . Influenza-Unspecified 02/05/2014    Past Medical History:  Diagnosis Date  . Allergy   . Aortic atherosclerosis ([place])   . Arthropathy   . CAD (coronary artery disease)   . Cardiomyopathy ([place])   . Chronic kidney disease   . Colon polyp   . COPD (chronic obstructive pulmonary disease) ([place])   . Diabetes ([place])   . Diabetic neuropathy ([place])   . Heart murmur   . Hypercholesterolemia   . Hypertension    pulmonary  . LVH (left ventricular hypertrophy)   . Lymphoma ([place])   . Mitral regurgitation   . Prostate cancer ([place])   . Pulmonary hypertension ([place])   . Tricuspid regurgitation     Tobacco History: Social History   Tobacco Use  Smoking Status Former Smoker  . Packs/day: 1.00  . Years: 25.00  . Pack years: 25.00  . Types: Cigarettes  . Last attempt to quit: 11/30/2007  . Years since quitting: 10.1  Smokeless Tobacco Never Used   Counseling given: Not Answered   Outpatient Medications Prior to Visit  Medication Sig Dispense Refill  . albuterol (PROVENTIL HFA;VENTOLIN HFA) 108 (90 BASE) MCG/ACT inhaler Inhale 2 puffs into the lungs 2 (two) times daily.    Marland Kitchen albuterol (PROVENTIL) (2.5 MG/3ML)  0.083% nebulizer solution Take 3 mLs (2.5 mg total) by nebulization 2 (two) times daily. 75 mL 0  . amiodarone (PACERONE) 200 MG tablet Take 1 tablet (200 mg total) by mouth daily. 90 tablet 0  . apixaban (ELIQUIS) 2.5 MG TABS tablet Take 1 tablet (2.5 mg total) by mouth 2 (two) times daily. 180 tablet 0  . aspirin 81 MG tablet Take 81 mg by mouth daily.    . Calcium Carb-Cholecalciferol (CALCIUM-VITAMIN D) 500-200 MG-UNIT tablet Take 1 tablet by mouth daily.     . fluticasone furoate-vilanterol (BREO ELLIPTA) 100-25 MCG/INH AEPB Inhale 1 puff into the lungs daily. 1 each 12  . furosemide  (LASIX) 40 MG tablet Take 1 tablet (40 mg total) by mouth daily. 30 tablet 3  . Multiple Vitamin (MULTIVITAMIN) tablet Take 1 tablet by mouth.     . potassium chloride (K-DUR) 10 MEQ tablet Take 1 tablet (10 mEq total) by mouth daily. 30 tablet 3  . umeclidinium bromide (INCRUSE ELLIPTA) 62.5 MCG/INH AEPB Inhale 1 puff into the lungs daily. 90 each 1  . hydrALAZINE (APRESOLINE) 10 MG tablet Take 1 tablet (10 mg total) by mouth 4 (four) times daily. (Patient not taking: Reported on 01/02/2018) 120 tablet 3   No facility-administered medications prior to visit.      Review of Systems  Constitutional:   No  weight loss, night sweats,  Fevers, chills, +fatigue, or  lassitude.  HEENT:   No headaches,  Difficulty swallowing,  Tooth/dental problems, or  Sore throat,                No sneezing, itching, ear ache, nasal congestion, post nasal drip,   CV:  No chest pain,  Orthopnea, PND, swelling in lower extremities, anasarca, dizziness, palpitations, syncope.   GI  No heartburn, indigestion, abdominal pain, nausea, vomiting, diarrhea, change in bowel habits, loss of appetite, bloody stools.   Resp:    No chest wall deformity  Skin: no rash or lesions.  GU: no dysuria, change in color of urine, no urgency or frequency.  No flank pain, no hematuria   MS:  No joint pain or swelling.  No decreased range of motion.  No back pain.    Physical Exam  BP 118/70 (BP Location: Left Arm, Patient Position: Sitting, Cuff Size: Normal)   Pulse 93   Ht 5\' 7"  (1.702 m)   Wt 153 lb (69.4 kg)   SpO2 97%   BMI 23.96 kg/m   GEN: A/Ox3; pleasant , NAD, frail and elderly    HEENT:  [place]/AT,  EACs-clear, TMs-wnl, NOSE-clear, THROAT-clear, no lesions, no postnasal drip or exudate noted.   NECK:  Supple w/ fair ROM; no JVD; normal carotid impulses w/o bruits; no thyromegaly or nodules palpated; no lymphadenopathy.    RESP decreased breath sounds in the bases. no accessory muscle use, no dullness to  percussion  CARD:  RRR, no m/r/g, no peripheral edema, pulses intact, no cyanosis or clubbing.  GI:   Soft & nt; nml bowel sounds; no organomegaly or masses detected.   Musco: Warm bil, no deformities or joint swelling noted.   Neuro: alert, no focal deficits noted.    Skin: Warm, no lesions or rashes    Lab Results:  CBC    Component Value Date/Time   WBC 3.9 (L) 12/28/2017 1040   RBC 4.90 12/28/2017 1040   HGB 13.6 12/28/2017 1040   HCT 40.2 12/28/2017 1040   PLT 197.0 12/28/2017 1040   MCV 82.0  12/28/2017 1040   MCH 27.7 12/14/2017 0611   MCHC 33.8 12/28/2017 1040   RDW 16.5 (H) 12/28/2017 1040   LYMPHSABS 0.3 12/10/2017 1038   MONOABS 0.7 12/10/2017 1038   EOSABS 0.0 12/10/2017 1038   BASOSABS 0.0 12/10/2017 1038    BMET    Component Value Date/Time   NA 137 01/02/2018 1700   K 4.4 01/02/2018 1700   CL 96 01/02/2018 1700   CO2 24 01/02/2018 1700   GLUCOSE 78 01/02/2018 1700   GLUCOSE 74 12/28/2017 1040   BUN 38 (H) 01/02/2018 1700   CREATININE 2.64 (H) 01/02/2018 1700   CREATININE 1.40 (H) 10/08/2014 0959   CALCIUM 9.7 01/02/2018 1700   GFRNONAA 22 (L) 01/02/2018 1700   GFRNONAA 48 (L) 10/08/2014 0959   GFRAA 25 (L) 01/02/2018 1700   GFRAA 55 (L) 10/08/2014 0959    BNP    Component Value Date/Time   BNP 1,224.9 (H) 12/10/2017 1038    ProBNP    Component Value Date/Time   PROBNP 3,493 (H) 01/02/2018 1700    Imaging: No results found.   Assessment & Plan:   No problem-specific Assessment & Plan notes found for this encounter.     Rexene Edison, NP 01/10/2018

## 2018-01-11 NOTE — Assessment & Plan Note (Signed)
Recent flare with associated PNA now improving   Plan  . Patient Instructions  Continue on BREO and INCRUSE daily . Rinse At bedtime  Use.  Continue on Oxygen with activity and At bedtime   Chest xray today .  Flu shot today  Follow up with Dr. Halford Chessman or Jaelan Rasheed NP in 2-3 months and As needed   Please contact office for sooner follow up if symptoms do not improve or worsen or seek emergency care

## 2018-01-11 NOTE — Assessment & Plan Note (Signed)
Multilobar PNA on CT chest -clnically is improving  Plan  Check cxr today .

## 2018-01-11 NOTE — Assessment & Plan Note (Signed)
Cont on O2 with act and At bedtime   

## 2018-01-16 NOTE — Progress Notes (Signed)
Called spoke with patient's spouse Eric Herring (dpr on file), advised of cxr results / recs as stated by TP.  Pt verbalized understanding and denied any questions.  Appt scheduled with TP in the [facility] office for 10.3.19 @ 0930 with cxr prior.  Appt card mailed to home address.  Patient/spouse aware to contact the office if symptoms do not continue to improve or they worsen.

## 2018-01-28 ENCOUNTER — Other Ambulatory Visit (INDEPENDENT_AMBULATORY_CARE_PROVIDER_SITE_OTHER): Payer: Medicare HMO

## 2018-01-28 DIAGNOSIS — R740 Nonspecific elevation of levels of transaminase and lactic acid dehydrogenase [LDH]: Secondary | ICD-10-CM

## 2018-01-28 DIAGNOSIS — R7401 Elevation of levels of liver transaminase levels: Secondary | ICD-10-CM

## 2018-01-28 LAB — COMPREHENSIVE METABOLIC PANEL
ALT: 19 U/L (ref 0–53)
AST: 20 U/L (ref 0–37)
Albumin: 3.9 g/dL (ref 3.5–5.2)
Alkaline Phosphatase: 83 U/L (ref 39–117)
BUN: 24 mg/dL — ABNORMAL HIGH (ref 6–23)
CO2: 27 meq/L (ref 19–32)
CREATININE: 2.2 mg/dL — AB (ref 0.40–1.50)
Calcium: 9.6 mg/dL (ref 8.4–10.5)
Chloride: 102 mEq/L (ref 96–112)
GFR: 37.08 mL/min — ABNORMAL LOW (ref >60.00)
GLUCOSE: 103 mg/dL — AB (ref 70–99)
Potassium: 4.2 mEq/L (ref 3.5–5.1)
Sodium: 139 mEq/L (ref 135–145)
TOTAL PROTEIN: 7.6 g/dL (ref 6.0–8.3)
Total Bilirubin: 0.8 mg/dL (ref 0.2–1.2)

## 2018-01-30 ENCOUNTER — Ambulatory Visit (INDEPENDENT_AMBULATORY_CARE_PROVIDER_SITE_OTHER): Payer: Medicare HMO | Admitting: Family Medicine

## 2018-01-30 ENCOUNTER — Encounter: Payer: Self-pay | Admitting: Family Medicine

## 2018-01-30 ENCOUNTER — Ambulatory Visit: Payer: Medicare HMO | Admitting: Podiatry

## 2018-01-30 ENCOUNTER — Ambulatory Visit (HOSPITAL_BASED_OUTPATIENT_CLINIC_OR_DEPARTMENT_OTHER)
Admission: RE | Admit: 2018-01-30 | Discharge: 2018-01-30 | Disposition: A | Discharge status: Home / Self Care | Payer: Medicare HMO | Source: Ambulatory Visit | Attending: Family Medicine | Admitting: Family Medicine

## 2018-01-30 VITALS — BP 94/68 | HR 113 | Temp 98.1°F | Resp 18 | Wt 158.0 lb

## 2018-01-30 DIAGNOSIS — J189 Pneumonia, unspecified organism: Secondary | ICD-10-CM

## 2018-01-30 DIAGNOSIS — J181 Lobar pneumonia, unspecified organism: Secondary | ICD-10-CM

## 2018-01-30 MED ORDER — METHYLPREDNISOLONE ACETATE 80 MG/ML IJ SUSP
80.0000 mg | Freq: Once | INTRAMUSCULAR | Status: AC
  Administered 2018-01-30: 80 mg via INTRAMUSCULAR

## 2018-01-30 NOTE — Patient Instructions (Signed)
Continue your inhalers.  We will be in contact regarding your chest x-ray.  Continue to push fluids, practice good hand hygiene, and cover your mouth if you cough.  If you start having fevers, shaking or worsening shortness of breath, seek immediate care.  Let us know if you need anything.

## 2018-01-30 NOTE — Progress Notes (Signed)
No chief complaint on file.   Eric Herring here for URI complaints.  Duration: 1 month  Associated symptoms: rhinorrhea, shortness of breath and cough Denies: sinus congestion, sinus pain, itchy watery eyes, ear pain, ear drainage, sore throat, myalgia and fevers Treatment to date: inhaler change by pulm team Sick contacts: No  ROS:  Const: Denies fevers HEENT: As noted in HPI Lungs: +SOB  Past Medical History:  Diagnosis Date  . Allergy   . Aortic atherosclerosis ([place])   . Arthropathy   . CAD (coronary artery disease)   . Cardiomyopathy ([place])   . Chronic kidney disease   . Colon polyp   . COPD (chronic obstructive pulmonary disease) ([place])   . Diabetes ([place])   . Diabetic neuropathy ([place])   . Heart murmur   . Hypercholesterolemia   . Hypertension    pulmonary  . LVH (left ventricular hypertrophy)   . Lymphoma ([place])   . Mitral regurgitation   . Prostate cancer ([place])   . Pulmonary hypertension ([place])   . Tricuspid regurgitation     BP 94/68   Pulse (!) 113   Temp 98.1 F (36.7 C) (Oral)   Resp 18   Wt 158 lb (71.7 kg)   SpO2 90%   BMI 24.75 kg/m  General: Awake, alert, appears stated age HEENT: AT, [place], ears patent b/l and TM's neg, nares patent w/o discharge, pharynx pink and without exudates, MMM Neck: No masses or asymmetry Heart: RRR Lungs: CTAB, no accessory muscle use Psych: normal mood and affect  Community acquired pneumonia of right middle lobe of lung ([place]) - Plan: DG Chest 2 View, methylPREDNISolone acetate (DEPO-MEDROL) injection 80 mg  Orders as above. Ck CXR. Could be developing a post infectious cough.  Cont on inhalers. Continue to push fluids, practice good hand hygiene, cover mouth when coughing. F/u prn. If starting to experience fevers, shaking, or shortness of breath, seek immediate care. Pt voiced understanding and agreement to the plan.  [place], DO 01/30/18 5:11 PM

## 2018-02-01 ENCOUNTER — Telehealth: Payer: Self-pay

## 2018-02-01 NOTE — Telephone Encounter (Signed)
OK 

## 2018-02-01 NOTE — Telephone Encounter (Signed)
-----   Message from Shelda Pal, DO sent at 01/31/2018  4:08 PM EDT ----- Let pt know his X-ray does not show anything new and no change in plan is required at this time. Let us know if he is still having issues. TY.

## 2018-02-01 NOTE — Telephone Encounter (Signed)
Author phoned pt. To notify about Xray results. Chief Strategy Officer spoke with wife, Georga Kaufmann, and Georga Kaufmann verbalized understanding.

## 2018-02-01 NOTE — Telephone Encounter (Signed)
Chief Strategy Officer received fax from [facility] requesting pravastatin rx to be sent in as 90 days, but no pravastatin found on active med list. Routed to Dr. [name] to advise.

## 2018-02-04 MED ORDER — PRAVASTATIN SODIUM 20 MG PO TABS: 20.0000 mg | ORAL_TABLET | Freq: Every day | ORAL | 3 refills | Status: DC

## 2018-02-04 NOTE — Telephone Encounter (Signed)
90 3 ref

## 2018-02-04 NOTE — Telephone Encounter (Signed)
20mg

## 2018-02-04 NOTE — Telephone Encounter (Signed)
Pravastatin is not on his list---how much to send in?

## 2018-02-04 NOTE — Telephone Encounter (Signed)
Sent in prescription 

## 2018-02-04 NOTE — Addendum Note (Signed)
Addended by: Sharon Seller B on: 02/04/2018 10:22 AM   Modules accepted: Orders

## 2018-02-04 NOTE — Telephone Encounter (Signed)
What dose to send in?

## 2018-02-05 ENCOUNTER — Other Ambulatory Visit: Payer: Self-pay

## 2018-02-05 ENCOUNTER — Telehealth: Payer: Self-pay | Admitting: *Deleted

## 2018-02-05 DIAGNOSIS — J189 Pneumonia, unspecified organism: Secondary | ICD-10-CM

## 2018-02-05 NOTE — Telephone Encounter (Signed)
Received request for the last year of Medical Records from numerous providers from [employer] of [place], [place]; forwarded to [place] for email/scan/SLS 10/01

## 2018-02-07 ENCOUNTER — Encounter: Payer: Self-pay | Admitting: Adult Health

## 2018-02-07 ENCOUNTER — Ambulatory Visit (INDEPENDENT_AMBULATORY_CARE_PROVIDER_SITE_OTHER): Payer: Medicare HMO | Admitting: Adult Health

## 2018-02-07 DIAGNOSIS — I5042 Chronic combined systolic (congestive) and diastolic (congestive) heart failure: Secondary | ICD-10-CM | POA: Diagnosis not present

## 2018-02-07 DIAGNOSIS — J189 Pneumonia, unspecified organism: Secondary | ICD-10-CM

## 2018-02-07 DIAGNOSIS — J9611 Chronic respiratory failure with hypoxia: Secondary | ICD-10-CM

## 2018-02-07 DIAGNOSIS — I272 Pulmonary hypertension, unspecified: Secondary | ICD-10-CM

## 2018-02-07 DIAGNOSIS — J432 Centrilobular emphysema: Secondary | ICD-10-CM

## 2018-02-07 MED ORDER — FLUTICASONE-UMECLIDIN-VILANT 100-62.5-25 MCG/INH IN AEPB: 1.0000 | INHALATION_SPRAY | Freq: Every day | RESPIRATORY_TRACT | 0 refills | Status: DC

## 2018-02-07 NOTE — Assessment & Plan Note (Signed)
Cont on O2 with act and At bedtime   

## 2018-02-07 NOTE — Progress Notes (Signed)
@Patient  ID: Eric Herring, male    DOB: 1935/04/24, 83 y.o.   MRN: [id number]  Chief Complaint  Patient presents with  . Follow-up    COPD Lurena Nida     Referring provider: Shelda Pal*  HPI: 83 year old male former smoker followed for COPD and bronchiectasis/ O2 RF - activity and At bedtime  .   Past medical history significant for lymphoma (2002) -Remission /Hematology ,  prostate cancer 2011 (remission -Urology )  and chronic kidney disease.  A Fib on Amiodarone /Eliquis (dx 12/2017 ) CHF dx 10/2017 )   TEST [facility] San Previous pulmonary care with Dr. Verdie Mosher in [facility]. CT chest February 2014 showed resolution of left lower lobe consolidation. Extensive changes of emphysema with bullae and blebs. 6 mm nodule stable in the right middle lobe bronchiectatic changes in the left lower lobe. Echo 10/2010 showed EF 60%, mild LVH, mild -moderate PHT  He quit smoking in 2009, about 30 Pyrs, was also exposed to sand dust in his occupation.   Spirometry 2014 - FEV1 of 34% (1.02) with FVC of 58% 2.32)  -completed pulm rehab  Spirometry January 2017 FEV1 41% ratio 43, FVC 73%  2D echo June 2019 EF 30 to 35%, moderate LVH, PA P 54 mmHg, mitral valve moderately regurg.,  Mild to moderate tricuspid regurg  CT chest August 2019 showed scattered areas of bronchiectasis extensive consolidation on the left lung, emphysema, stable small pulmonary nodule since 2016 consistent with benign etiology.  02/07/2018 Follow up: COPD, bronchiectasis, pneumonia, O2 RF , CHF  Patient returns for a one-month follow-up.  Patient was admitted in August for a multi lobar pneumonia.  He was treated with antibiotics with clinical improvement.  Patient says since discharge he is getting stronger.  He continues to have a productive cough with thick mucus most days.  But feels that he is getting stronger.  Appetite is improved.  Chest x-ray last month showed persistent left basilar opacity.  Patient was  seen by PCP September 26 that showed persistent left basilar opacity.  Denies any fever hemoptysis orthopnea PND.  He does have chronic leg swelling without any increase.  Weight is been stable  Patient remains on oxygen 2 L with activity and at bedtime.  Says he is trying to be more active does get winded when he walks a long distance.  Patient has underlying severe COPD.  He is on Breo and Incruse inhalers.  We have completed patient assistance paperwork however he does not qualify for [place] assistance due to not spending out $600 in calendar year.  He does have trouble affording his inhalers.  Patient is followed by cardiology for recently diagnosed congestive heart failure and A. fib.  He remains on amiodarone and Eliquis.  Denies any known bleeding.  Says lower extremity swelling is stable without any increase.  He remains on Lasix.  40 mg daily  patient does have chronic kidney disease.  With a baseline creatinine around 2.2. We discussed a low-salt diet.  Flu shot is up-to-date.      Allergies  Allergen Reactions  . Lisinopril Anaphylaxis and Swelling         Immunization History  Administered Date(s) Administered  . Influenza Split 02/15/2013  . Influenza Whole 03/01/2012  . Influenza, High Dose Seasonal PF 02/20/2016, 01/10/2018  . Influenza,inj,Quad PF,6+ Mos 01/07/2015  . Influenza-Unspecified 02/05/2014    Past Medical History:  Diagnosis Date  . Allergy   . Aortic atherosclerosis ([place])   . Arthropathy   .  CAD (coronary artery disease)   . Cardiomyopathy ([place])   . Chronic kidney disease   . Colon polyp   . COPD (chronic obstructive pulmonary disease) ([place])   . Diabetes ([place])   . Diabetic neuropathy ([place])   . Heart murmur   . Hypercholesterolemia   . Hypertension    pulmonary  . LVH (left ventricular hypertrophy)   . Lymphoma ([place])   . Mitral regurgitation   . Prostate cancer ([place])   . Pulmonary hypertension ([place])   . Tricuspid regurgitation     Tobacco  History: Social History   Tobacco Use  Smoking Status Former Smoker  . Packs/day: 1.00  . Years: 25.00  . Pack years: 25.00  . Types: Cigarettes  . Last attempt to quit: 11/30/2007  . Years since quitting: 10.1  Smokeless Tobacco Never Used   Counseling given: Not Answered   Outpatient Medications Prior to Visit  Medication Sig Dispense Refill  . albuterol (PROVENTIL HFA;VENTOLIN HFA) 108 (90 Base) MCG/ACT inhaler Inhale 2 puffs into the lungs 2 (two) times daily as needed for wheezing or shortness of breath.    Marland Kitchen albuterol (PROVENTIL) (2.5 MG/3ML) 0.083% nebulizer solution Take 3 mLs (2.5 mg total) by nebulization 2 (two) times daily as needed for wheezing or shortness of breath. 75 mL 0  . apixaban (ELIQUIS) 2.5 MG TABS tablet Take 1 tablet (2.5 mg total) by mouth 2 (two) times daily. 180 tablet 0  . aspirin 81 MG tablet Take 81 mg by mouth daily.    . Calcium Carb-Cholecalciferol (CALCIUM-VITAMIN D) 500-200 MG-UNIT tablet Take 1 tablet by mouth daily.     . fluticasone furoate-vilanterol (BREO ELLIPTA) 100-25 MCG/INH AEPB Inhale 1 puff into the lungs daily. 1 each 12  . furosemide (LASIX) 40 MG tablet Take 1 tablet (40 mg total) by mouth daily. 30 tablet 3  . Multiple Vitamin (MULTIVITAMIN) tablet Take 1 tablet by mouth.     . potassium chloride (K-DUR) 10 MEQ tablet Take 1 tablet (10 mEq total) by mouth daily. 30 tablet 3  . pravastatin (PRAVACHOL) 20 MG tablet Take 1 tablet (20 mg total) by mouth daily. 90 tablet 3  . umeclidinium bromide (INCRUSE ELLIPTA) 62.5 MCG/INH AEPB Inhale 1 puff into the lungs daily. 90 each 1  . albuterol (PROVENTIL HFA;VENTOLIN HFA) 108 (90 BASE) MCG/ACT inhaler Inhale 2 puffs into the lungs 2 (two) times daily.    Marland Kitchen albuterol (PROVENTIL) (2.5 MG/3ML) 0.083% nebulizer solution Take 3 mLs (2.5 mg total) by nebulization 2 (two) times daily. 75 mL 0  . amiodarone (PACERONE) 200 MG tablet Take 1 tablet (200 mg total) by mouth daily. 90 tablet 0   No  facility-administered medications prior to visit.      Review of Systems  Constitutional:   No  weight loss, night sweats,  Fevers, chills,  +fatigue, or  lassitude.  HEENT:   No headaches,  Difficulty swallowing,  Tooth/dental problems, or  Sore throat,                No sneezing, itching, ear ache, nasal congestion, post nasal drip,   CV:  No chest pain,  Orthopnea, PND, +swelling in lower extremities,  No anasarca, dizziness, palpitations, syncope.   GI  No heartburn, indigestion, abdominal pain, nausea, vomiting, diarrhea, change in bowel habits, loss of appetite, bloody stools.   Resp: ,  No coughing up of blood.  No change in color of mucus.  No wheezing.  No chest wall deformity  Skin: no  rash or lesions.  GU: no dysuria, change in color of urine, no urgency or frequency.  No flank pain, no hematuria   MS:  No joint pain or swelling.  No decreased range of motion.  No back pain.    Physical Exam  BP 115/84 (BP Location: Left Arm, Patient Position: Sitting, Cuff Size: Normal)   Pulse 91   Ht 5\' 7"  (1.702 m)   Wt 159 lb (72.1 kg)   SpO2 97%   BMI 24.90 kg/m   GEN: A/Ox3; pleasant , NAD, frail and elderly    HEENT:  [place]/AT,  EACs-clear, TMs-wnl, NOSE-clear, THROAT-clear, no lesions, no postnasal drip or exudate noted.   NECK:  Supple w/ fair ROM; no JVD; normal carotid impulses w/o bruits; no thyromegaly or nodules palpated; no lymphadenopathy.    RESP  Scattered rhonchi . no accessory muscle use, no dullness to percussion  CARD:  Irregular  no m/r/g, 1-2+ peripheral edema, pulses intact, no cyanosis or clubbing.  GI:   Soft & nt; nml bowel sounds; no organomegaly or masses detected.   Musco: Warm bil, no deformities or joint swelling noted.   Neuro: alert, no focal deficits noted.    Skin: Warm, no lesions or rashes    Lab Results:  CBC    Component Value Date/Time   WBC 3.9 (L) 12/28/2017 1040   RBC 4.90 12/28/2017 1040   HGB 13.6 12/28/2017 1040     HCT 40.2 12/28/2017 1040   PLT 197.0 12/28/2017 1040   MCV 82.0 12/28/2017 1040   MCH 27.7 12/14/2017 0611   MCHC 33.8 12/28/2017 1040   RDW 16.5 (H) 12/28/2017 1040   LYMPHSABS 0.3 12/10/2017 1038   MONOABS 0.7 12/10/2017 1038   EOSABS 0.0 12/10/2017 1038   BASOSABS 0.0 12/10/2017 1038    BMET    Component Value Date/Time   NA 139 01/28/2018 0928   NA 137 01/02/2018 1700   K 4.2 01/28/2018 0928   CL 102 01/28/2018 0928   CO2 27 01/28/2018 0928   GLUCOSE 103 (H) 01/28/2018 0928   BUN 24 (H) 01/28/2018 0928   BUN 38 (H) 01/02/2018 1700   CREATININE 2.20 (H) 01/28/2018 0928   CREATININE 1.40 (H) 10/08/2014 0959   CALCIUM 9.6 01/28/2018 0928   GFRNONAA 22 (L) 01/02/2018 1700   GFRNONAA 48 (L) 10/08/2014 0959   GFRAA 25 (L) 01/02/2018 1700   GFRAA 55 (L) 10/08/2014 0959    BNP    Component Value Date/Time   BNP 1,224.9 (H) 12/10/2017 1038    ProBNP    Component Value Date/Time   PROBNP 3,493 (H) 01/02/2018 1700    Imaging: Dg Chest 2 View  Result Date: 01/31/2018 CLINICAL DATA:  Follow-up pneumonia EXAM: CHEST - 2 VIEW COMPARISON:  CT from 12/10/2017, chest x-ray from 01/10/2018 FINDINGS: Cardiac shadow is within normal limits. The lungs are again hyperinflated. Persistent density is again noted at the left base stable from a prior CT on 12/10/2017. The chronicity would suggest a more chronic inflammatory process. Similar changes are noted within the left upper lobe laterally. No new focal infiltrate is seen. No acute bony abnormality is noted. IMPRESSION: Chronic changes likely related to scarring particularly in the left mid and lower lung. No new focal abnormality is seen. Electronically Signed   By: Inez Catalina M.D.   On: 01/31/2018 08:21   Dg Chest 2 View  Result Date: 01/10/2018 CLINICAL DATA:  Follow-up pneumonia EXAM: CHEST - 2 VIEW COMPARISON:  12/10/2017, CT chest  12/10/2017 FINDINGS: Hyperinflation with emphysematous disease. Persistent bronchiectasis and  airspace disease in the left lower lobe. No pleural effusion. Stable cardiomediastinal silhouette with aortic atherosclerosis. No pneumothorax. IMPRESSION: 1. Persistent airspace opacity at the left lung base may reflect residual pneumonia, suggest additional short interval radiographic follow-up 2. Hyperinflation with emphysematous disease. Electronically Signed   By: Donavan Foil M.D.   On: 01/10/2018 17:15    Reviewed independently and agree with interpretation  Assessment & Plan:   Pulmonary hypertension, unspecified ([place]) Cont on o2 to keep sats >90% Cont diureiss as scr allows to avoid volume overload  Cont on COPD tx and CHF tx .   Congestive heart failure (HCC) Appears stable without evidence of overt volume overload.  Cont current regimen and follow up with cards as planned  On amiodarone -cont to monitor as underlying COPD/Emphysema/O2 RF   COPD (chronic obstructive pulmonary disease) (HCC) Stable on BREO and INCRUSE  Would like to switch to something more affordable .  Will check with DME if nebs would be affordable option (budesonide/brovana /Yupelri )  Not candidate for [employer] assistance (due to not spending out $600/calendar year) .   Samples of TRELEGY given in place of BREO /INCRUSE to help with cost -patient and wife are aware to hold Breo and Incruse while taking Trelegy. Can also consider Wyexla -and change to ipratropium . Might be affordable . Will have to investigate with pharm/insurance options   Plan  Patient Instructions  Continue on BREO and INCRUSE daily . Rinse At bedtime   Begin Flutter valve Three times a day   Mucinex DM Twice daily  As needed  Cough/congestion .  Use your Albuterol inhaler or Neb As needed  Only for wheezing/shortness of breath .  I am going to look at options that might be more affordable for your inhalers.  Continue on Oxygen with activity and At bedtime   Follow up with cardiology as planned .  Keep on low salt diet . Legs elevated  when able .  Activity as tolerated.  Follow up with Dr. Halford Chessman or Parrett NP in 6 weeks with chest xray .  Please contact office for sooner follow up if symptoms do not improve or worsen or seek emergency care          CAP (community acquired pneumonia) Left sided PNA dx 12/2017 with acute sx and consolidation on CXR and CT chest .  Previous CXR in May no infiltrate noted .  Suspect lag time on CXR is why this has not cleared as pt has severe COPD /Emphysema and Bronchiectasis  Will add flutter valve .  Cont pulmonary hygeien  Repeat CXR in 6 weeks , if not resolved, consider CT chest   Plan  Patient Instructions  Continue on BREO and INCRUSE daily . Rinse At bedtime   Begin Flutter valve Three times a day   Mucinex DM Twice daily  As needed  Cough/congestion .  Use your Albuterol inhaler or Neb As needed  Only for wheezing/shortness of breath .  I am going to look at options that might be more affordable for your inhalers.  Continue on Oxygen with activity and At bedtime   Follow up with cardiology as planned .  Keep on low salt diet . Legs elevated when able .  Activity as tolerated.  Follow up with Dr. Halford Chessman or Parrett NP in 6 weeks with chest xray .  Please contact office for sooner follow up if symptoms do not improve or  worsen or seek emergency care         Chronic respiratory failure with hypoxia ([place]) Cont on O2 with act and At bedtime       Rexene Edison, NP 02/07/2018

## 2018-02-07 NOTE — Progress Notes (Signed)
Reviewed and agree with assessment/plan.   Soham Hollett, MD [place] [facility] 05/03/2016, 12:24 PM Pager:  [phone]  

## 2018-02-07 NOTE — Assessment & Plan Note (Signed)
Left sided PNA dx 12/2017 with acute sx and consolidation on CXR and CT chest .  Previous CXR in May no infiltrate noted .  Suspect lag time on CXR is why this has not cleared as pt has severe COPD /Emphysema and Bronchiectasis  Will add flutter valve .  Cont pulmonary hygeien  Repeat CXR in 6 weeks , if not resolved, consider CT chest   Plan  Patient Instructions  Continue on BREO and INCRUSE daily . Rinse At bedtime   Begin Flutter valve Three times a day   Mucinex DM Twice daily  As needed  Cough/congestion .  Use your Albuterol inhaler or Neb As needed  Only for wheezing/shortness of breath .  I am going to look at options that might be more affordable for your inhalers.  Continue on Oxygen with activity and At bedtime   Follow up with cardiology as planned .  Keep on low salt diet . Legs elevated when able .  Activity as tolerated.  Follow up with Dr. Halford Chessman or Parrett NP in 6 weeks with chest xray .  Please contact office for sooner follow up if symptoms do not improve or worsen or seek emergency care

## 2018-02-07 NOTE — Patient Instructions (Addendum)
Continue on BREO and INCRUSE daily . Rinse At bedtime   Begin Flutter valve Three times a day   Mucinex DM Twice daily  As needed  Cough/congestion .  Use your Albuterol inhaler or Neb As needed  Only for wheezing/shortness of breath .  I am going to look at options that might be more affordable for your inhalers.  Continue on Oxygen with activity and At bedtime   Follow up with cardiology as planned .  Keep on low salt diet . Legs elevated when able .  Activity as tolerated.  Follow up with Dr. Halford Chessman or Parrett NP in 6 weeks with chest xray .  Please contact office for sooner follow up if symptoms do not improve or worsen or seek emergency care

## 2018-02-07 NOTE — Assessment & Plan Note (Signed)
Stable on BREO and INCRUSE  Would like to switch to something more affordable .  Will check with DME if nebs would be affordable option (budesonide/brovana /Yupelri )  Not candidate for [employer] assistance (due to not spending out $600/calendar year) .   Samples of TRELEGY given in place of BREO /INCRUSE to help with cost -patient and wife are aware to hold Breo and Incruse while taking Trelegy. Can also consider Wyexla -and change to ipratropium . Might be affordable . Will have to investigate with pharm/insurance options   Plan  Patient Instructions  Continue on BREO and INCRUSE daily . Rinse At bedtime   Begin Flutter valve Three times a day   Mucinex DM Twice daily  As needed  Cough/congestion .  Use your Albuterol inhaler or Neb As needed  Only for wheezing/shortness of breath .  I am going to look at options that might be more affordable for your inhalers.  Continue on Oxygen with activity and At bedtime   Follow up with cardiology as planned .  Keep on low salt diet . Legs elevated when able .  Activity as tolerated.  Follow up with Dr. Halford Chessman or Parrett NP in 6 weeks with chest xray .  Please contact office for sooner follow up if symptoms do not improve or worsen or seek emergency care

## 2018-02-07 NOTE — Assessment & Plan Note (Signed)
Cont on o2 to keep sats >90% Cont diureiss as scr allows to avoid volume overload  Cont on COPD tx and CHF tx .

## 2018-02-07 NOTE — Assessment & Plan Note (Signed)
Appears stable without evidence of overt volume overload.  Cont current regimen and follow up with cards as planned  On amiodarone -cont to monitor as underlying COPD/Emphysema/O2 RF

## 2018-03-12 ENCOUNTER — Other Ambulatory Visit: Payer: Self-pay | Admitting: Family Medicine

## 2018-03-19 ENCOUNTER — Ambulatory Visit (HOSPITAL_BASED_OUTPATIENT_CLINIC_OR_DEPARTMENT_OTHER)
Admission: RE | Admit: 2018-03-19 | Discharge: 2018-03-19 | Disposition: A | Discharge status: Home / Self Care | Payer: Medicare HMO | Source: Ambulatory Visit | Attending: Adult Health | Admitting: Adult Health

## 2018-03-19 DIAGNOSIS — J189 Pneumonia, unspecified organism: Secondary | ICD-10-CM | POA: Insufficient documentation

## 2018-03-19 DIAGNOSIS — J449 Chronic obstructive pulmonary disease, unspecified: Secondary | ICD-10-CM | POA: Diagnosis not present

## 2018-03-21 ENCOUNTER — Encounter: Payer: Self-pay | Admitting: Adult Health

## 2018-03-21 ENCOUNTER — Ambulatory Visit: Payer: Medicare HMO | Admitting: Adult Health

## 2018-03-21 VITALS — BP 114/76 | HR 102 | Ht 66.0 in | Wt 166.0 lb

## 2018-03-21 DIAGNOSIS — I5022 Chronic systolic (congestive) heart failure: Secondary | ICD-10-CM

## 2018-03-21 DIAGNOSIS — J432 Centrilobular emphysema: Secondary | ICD-10-CM

## 2018-03-21 DIAGNOSIS — J9611 Chronic respiratory failure with hypoxia: Secondary | ICD-10-CM

## 2018-03-21 DIAGNOSIS — J189 Pneumonia, unspecified organism: Secondary | ICD-10-CM

## 2018-03-21 DIAGNOSIS — Z23 Encounter for immunization: Secondary | ICD-10-CM | POA: Diagnosis not present

## 2018-03-21 MED ORDER — FLUTICASONE FUROATE-VILANTEROL 100-25 MCG/INH IN AEPB: 1.0000 | INHALATION_SPRAY | Freq: Every day | RESPIRATORY_TRACT | 0 refills | Status: DC

## 2018-03-21 NOTE — Assessment & Plan Note (Signed)
Continue on oxygen 2 L with activity and at bedtime 

## 2018-03-21 NOTE — Progress Notes (Signed)
Reviewed and agree with assessment/plan.   Toluwanimi Radebaugh, MD [place] [facility] 05/03/2016, 12:24 PM Pager:  [phone]  

## 2018-03-21 NOTE — Assessment & Plan Note (Signed)
Multi lobar pneumonia diagnosed in August of this year.  Patient is clinically improved chest x-ray shows serial improvement.

## 2018-03-21 NOTE — Assessment & Plan Note (Signed)
Severe COPD currently compensated on present regimen.  Plan  Patient Instructions  Continue on BREO and INCRUSE daily . Rinse At bedtime   Flutter valve Three times a day   Mucinex DM Twice daily  As needed  Cough/congestion .  Use your Albuterol inhaler or Neb As needed  Only for wheezing/shortness of breath .  Continue on Oxygen with activity and At bedtime    able .  Activity as tolerated.  Pneumovax vaccine today .   Follow up with Dr. Halford Chessman or Jaideep Pollack NP in 3 months and As needed  .  Please contact office for sooner follow up if symptoms do not improve or worsen or seek emergency care

## 2018-03-21 NOTE — Patient Instructions (Addendum)
Continue on BREO and INCRUSE daily . Rinse At bedtime   Flutter valve Three times a day   Mucinex DM Twice daily  As needed  Cough/congestion .  Use your Albuterol inhaler or Neb As needed  Only for wheezing/shortness of breath .  Continue on Oxygen with activity and At bedtime    able .  Activity as tolerated.  Pneumovax vaccine today .   Follow up with Dr. Halford Chessman or Parrett NP in 3 months and As needed  .  Please contact office for sooner follow up if symptoms do not improve or worsen or seek emergency care

## 2018-03-21 NOTE — Progress Notes (Signed)
@Patient  ID: Eric Herring, male    DOB: 1934/10/22, 83 y.o.   MRN: [id number]  Chief Complaint  Patient presents with  . Follow-up    COPD     Referring provider: Shelda Pal*  HPI: 83 year old male former smoker followed for COPD and bronchiectasis, O2 RF   Past medical history significant for lymphoma (2002), nasopharyngeal squamous cell carcinoma, prostate cancer 2011 and chronic kidney disease Prevnar 2003 ,   TEST/EVENTS :  Previous pulmonary care with Dr. Verdie Mosher in [facility]. CT chest February 2014 showed resolution of left lower lobe consolidation. Extensive changes of emphysema with bullae and blebs. 6 mm nodule stable in the right middle lobe bronchiectatic changes in the left lower lobe. Echo 10/2010 showed EF 60%, mild LVH, mild -moderate PHT  He quit smoking in 2009, about 30 Pyrs, was also exposed to sand dust in his occupation.   Spirometry 2014 - FEV1 of 34% (1.02) with FVC of 58% 2.32)  -completed pulm rehab   Spirometry January 2017 FEV1 41% ratio 43, FVC 73%  2D echo June 2019 EF 30 to 35%, moderate LVH, PA P 54 mmHg, mitral valve moderately regurg.,  Mild to moderate tricuspid regurg  CT chest August 2019 showed scattered areas of bronchiectasis extensive consolidation on the left lung, emphysema, stable small pulmonary nodule since 2016 consistent with benign etiology.  03/21/2018 Follow up : COPD . PNA , O2 RF  Patient presents for a one-month follow-up.  Patient says overall he is starting to feel better.Strength is starting to improve. Has shortness of breath with heavy activity.  Patient says he is active at home he is a caregiver for his wife who has multiple medical problems patient has severe COPD.  He remains on Breo and Incruse.  We have tried to get patient assistance but they do not qualify.    Patient was treated for a multi lobar pneumonia in August of this year.  He has been slowly improving.  Says over the last month  has felt a lot better.  Chest x-ray earlier this week showed improvement in the left lower lobe opacity.  Chronic scarring stable  He remains on oxygen 2 L with activity and at bedtime.  He is due for Pneumovax.  Prevnar and flu shot are up-to-date.  Patient is followed by cardiology with recent diagnosis of congestive heart failure and A. fib.  He is on amiodarone and Eliquis.  He has an upcoming appointment with cardiology later this month.  He says lower extremity swelling has been much improved.    Allergies  Allergen Reactions  . Lisinopril Anaphylaxis and Swelling         Immunization History  Administered Date(s) Administered  . Influenza Split 02/15/2013  . Influenza Whole 03/01/2012  . Influenza, High Dose Seasonal PF 02/20/2016, 01/10/2018  . Influenza,inj,Quad PF,6+ Mos 01/07/2015  . Influenza-Unspecified 02/05/2014  . Pneumococcal Polysaccharide-23 03/21/2018    Past Medical History:  Diagnosis Date  . Allergy   . Aortic atherosclerosis ([place])   . Arthropathy   . CAD (coronary artery disease)   . Cardiomyopathy ([place])   . Chronic kidney disease   . Colon polyp   . COPD (chronic obstructive pulmonary disease) ([place])   . Diabetes ([place])   . Diabetic neuropathy ([place])   . Heart murmur   . Hypercholesterolemia   . Hypertension    pulmonary  . LVH (left ventricular hypertrophy)   . Lymphoma ([place])   . Mitral regurgitation   .  Prostate cancer ([place])   . Pulmonary hypertension ([place])   . Tricuspid regurgitation     Tobacco History: Social History   Tobacco Use  Smoking Status Former Smoker  . Packs/day: 1.00  . Years: 25.00  . Pack years: 25.00  . Types: Cigarettes  . Last attempt to quit: 11/30/2007  . Years since quitting: 10.3  Smokeless Tobacco Never Used   Counseling given: Not Answered   Outpatient Medications Prior to Visit  Medication Sig Dispense Refill  . albuterol (PROVENTIL HFA;VENTOLIN HFA) 108 (90 Base) MCG/ACT inhaler Inhale 2 puffs into  the lungs every 6 (six) hours as needed for wheezing or shortness of breath.    Marland Kitchen albuterol (PROVENTIL) (2.5 MG/3ML) 0.083% nebulizer solution Take 3 mLs (2.5 mg total) by nebulization every 6 (six) hours as needed for wheezing or shortness of breath. 75 mL 0  . amiodarone (PACERONE) 200 MG tablet TAKE 1 TABLET EVERY DAY 90 tablet 0  . apixaban (ELIQUIS) 2.5 MG TABS tablet Take 1 tablet (2.5 mg total) by mouth 2 (two) times daily. 180 tablet 0  . aspirin 81 MG tablet Take 81 mg by mouth daily.    . Calcium Carb-Cholecalciferol (CALCIUM-VITAMIN D) 500-200 MG-UNIT tablet Take 1 tablet by mouth daily.     . fluticasone furoate-vilanterol (BREO ELLIPTA) 100-25 MCG/INH AEPB Inhale 1 puff into the lungs daily. 1 each 12  . furosemide (LASIX) 40 MG tablet Take 1 tablet (40 mg total) by mouth daily. 30 tablet 3  . Multiple Vitamin (MULTIVITAMIN) tablet Take 1 tablet by mouth.     . pravastatin (PRAVACHOL) 20 MG tablet Take 1 tablet (20 mg total) by mouth daily. 90 tablet 3  . umeclidinium bromide (INCRUSE ELLIPTA) 62.5 MCG/INH AEPB Inhale 1 puff into the lungs daily. 90 each 1  . potassium chloride (K-DUR) 10 MEQ tablet Take 1 tablet (10 mEq total) by mouth daily. 30 tablet 3  . Fluticasone-Umeclidin-Vilant (TRELEGY ELLIPTA) 100-62.5-25 MCG/INH AEPB Inhale 1 puff into the lungs daily. (Patient not taking: Reported on 03/21/2018) 2 each 0   No facility-administered medications prior to visit.      Review of Systems  Constitutional:   No  weight loss, night sweats,  Fevers, chills,  +fatigue, or  lassitude.  HEENT:   No headaches,  Difficulty swallowing,  Tooth/dental problems, or  Sore throat,                No sneezing, itching, ear ache, nasal congestion, post nasal drip,   CV:  No chest pain,  Orthopnea, PND,  , anasarca, dizziness, palpitations, syncope.   GI  No heartburn, indigestion, abdominal pain, nausea, vomiting, diarrhea, change in bowel habits, loss of appetite, bloody stools.    Resp:   No chest wall deformity  Skin: no rash or lesions.  GU: no dysuria, change in color of urine, no urgency or frequency.  No flank pain, no hematuria   MS:  No joint pain or swelling.  No decreased range of motion.  No back pain.    Physical Exam  BP 114/76 (BP Location: Left Arm, Cuff Size: Normal)   Pulse (!) 102   Ht 5\' 6"  (1.676 m)   Wt 166 lb (75.3 kg)   SpO2 91%   BMI 26.79 kg/m   GEN: A/Ox3; pleasant , NAD, thin elderly and frail   HEENT:  [place]/AT,  EACs-clear, TMs-wnl, NOSE-clear, THROAT-clear, no lesions, no postnasal drip or exudate noted.   NECK:  Supple w/ fair ROM; no JVD;  normal carotid impulses w/o bruits; no thyromegaly or nodules palpated; no lymphadenopathy.    RESP few trace rhonchi  no accessory muscle use, no dullness to percussion  CARD:  RRR, no m/r/g, trace to 1+ peripheral edema, pulses intact, no cyanosis or clubbing.  GI:   Soft & nt; nml bowel sounds; no organomegaly or masses detected.   Musco: Warm bil, no deformities or joint swelling noted.   Neuro: alert, no focal deficits noted.    Skin: Warm, no lesions or rashes    Lab Results:  CBC   Imaging: Dg Chest 2 View  Result Date: 03/19/2018 CLINICAL DATA:  Follow-up pneumonia.  Cough, congestion EXAM: CHEST - 2 VIEW COMPARISON:  01/30/2018 FINDINGS: There is hyperinflation of the lungs compatible with COPD. Linear densities in the lung bases likely reflects scarring. The opacity in the left base has improved since prior study, likely reflecting resolving infiltrate. No effusions. Heart is normal size. No acute bony abnormality. IMPRESSION: COPD, chronic changes. Improving density at the left base likely reflects resolving infiltrate/pneumonia. Electronically Signed   By: Rolm Baptise M.D.   On: 03/19/2018 12:44    methylPREDNISolone acetate (DEPO-MEDROL) injection 80 mg    Date Action Dose Route User   01/30/2018 1533 Given 80 mg Intramuscular (Right Deltoid) Melene Plan, CMA      No flowsheet data found.  No results found for: NITRICOXIDE      Assessment & Plan:   COPD (chronic obstructive pulmonary disease) (HCC) Severe COPD currently compensated on present regimen.  Plan  Patient Instructions  Continue on BREO and INCRUSE daily . Rinse At bedtime   Flutter valve Three times a day   Mucinex DM Twice daily  As needed  Cough/congestion .  Use your Albuterol inhaler or Neb As needed  Only for wheezing/shortness of breath .  Continue on Oxygen with activity and At bedtime    able .  Activity as tolerated.  Pneumovax vaccine today .   Follow up with Dr. Halford Chessman or Keyani Rigdon NP in 3 months and As needed  .  Please contact office for sooner follow up if symptoms do not improve or worsen or seek emergency care         Systolic congestive heart failure ([place]) Appears compensated on present regimen.  Continue on current meds and follow-up with cardiology as planned this month  Chronic respiratory failure with hypoxia ([place]) Continue on oxygen 2 L with activity and at bedtime  CAP (community acquired pneumonia) Multi lobar pneumonia diagnosed in August of this year.  Patient is clinically improved chest x-ray shows serial improvement.     Rexene Edison, NP 03/21/2018

## 2018-03-21 NOTE — Assessment & Plan Note (Signed)
Appears compensated on present regimen.  Continue on current meds and follow-up with cardiology as planned this month

## 2018-04-01 ENCOUNTER — Encounter: Payer: Self-pay | Admitting: Cardiology

## 2018-04-01 ENCOUNTER — Ambulatory Visit: Payer: Medicare HMO | Admitting: Cardiology

## 2018-04-01 VITALS — BP 104/74 | HR 84 | Wt 169.0 lb

## 2018-04-01 DIAGNOSIS — I420 Dilated cardiomyopathy: Secondary | ICD-10-CM | POA: Diagnosis not present

## 2018-04-01 DIAGNOSIS — I480 Paroxysmal atrial fibrillation: Secondary | ICD-10-CM | POA: Diagnosis not present

## 2018-04-01 DIAGNOSIS — J432 Centrilobular emphysema: Secondary | ICD-10-CM | POA: Diagnosis not present

## 2018-04-01 DIAGNOSIS — I5042 Chronic combined systolic (congestive) and diastolic (congestive) heart failure: Secondary | ICD-10-CM

## 2018-04-01 MED ORDER — AMIODARONE HCL 100 MG PO TABS: 100.0000 mg | ORAL_TABLET | Freq: Every day | ORAL | 1 refills | Status: DC

## 2018-04-01 NOTE — Patient Instructions (Signed)
Medication Instructions:  Your physician has recommended you make the following change in your medication:  DECREASE: Amiodarone to 100 mg daily.   If you need a refill on your cardiac medications before your next appointment, please call your pharmacy.   Lab work: Your physician recommends that you return for lab work today: Bmp, cbc, bnp  If you have labs (blood work) drawn today and your tests are completely normal, you will receive your results only by: Marland Kitchen MyChart Message (if you have MyChart) OR . A paper copy in the mail If you have any lab test that is abnormal or we need to change your treatment, we will call you to review the results.  Testing/Procedures: None.  Follow-Up: At [facility], you and your health needs are our priority.  As part of our continuing mission to provide you with exceptional heart care, we have created designated Provider Care Teams.  These Care Teams include your primary Cardiologist (physician) and Advanced Practice Providers (APPs -  Physician Assistants and Nurse Practitioners) who all work together to provide you with the care you need, when you need it. You will need a follow up appointment in 1 months.  Please call our office 2 months in advance to schedule this appointment.  You may see Jenne Campus, MD or another member of our [place] Provider Team in [place]: Shirlee More, MD . Jyl Heinz, MD  Any Other Special Instructions Will Be Listed Below (If Applicable).

## 2018-04-01 NOTE — Progress Notes (Signed)
Cardiology Office Note:    Date:  04/01/2018   ID:  Eric Herring, DOB 28-Dec-1934, MRN [id number]  PCP:  Shelda Pal, DO  Cardiologist:  Jenne Campus, MD    Referring MD: Shelda Pal*   Chief Complaint  Patient presents with  . Follow-up  Doing fair  History of Present Illness:    Eric Herring is a 83 y.o. male with paroxysmal atrial fibrillation, cardiomyopathy with ejection fraction 30 to 35%, advanced COPD followed by pulmonary.  Comes to our office in follow-up doing fair described to have some shortness of breath a little bit more than usual he he does have evidence of the cause basilar congestive heart failure.  He does have swelling of lower extremities also few crackles both bases.  Does have proximal nocturnal dyspnea but this is nothing extraordinary swelling of lower extremities however is new he does have hepatojugular reflux while laying at 45 degree angle but no JVD at that position.  Denies having a palpitations no chest pain tightness squeezing pressure burning chest.    Past Medical History:  Diagnosis Date  . Allergy   . Aortic atherosclerosis ([place])   . Arthropathy   . CAD (coronary artery disease)   . Cardiomyopathy ([place])   . Chronic kidney disease   . Colon polyp   . COPD (chronic obstructive pulmonary disease) ([place])   . Diabetes ([place])   . Diabetic neuropathy ([place])   . Heart murmur   . Hypercholesterolemia   . Hypertension    pulmonary  . LVH (left ventricular hypertrophy)   . Lymphoma ([place])   . Mitral regurgitation   . Prostate cancer ([place])   . Pulmonary hypertension ([place])   . Tricuspid regurgitation     Past Surgical History:  Procedure Laterality Date  . HEMORROIDECTOMY     pt does not remember year    Current Medications: Current Meds  Medication Sig  . albuterol (PROVENTIL HFA;VENTOLIN HFA) 108 (90 Base) MCG/ACT inhaler Inhale 2 puffs into the lungs every 6 (six) hours as needed for wheezing or shortness of  breath.  Marland Kitchen albuterol (PROVENTIL) (2.5 MG/3ML) 0.083% nebulizer solution Take 3 mLs (2.5 mg total) by nebulization every 6 (six) hours as needed for wheezing or shortness of breath.  Marland Kitchen amiodarone (PACERONE) 200 MG tablet TAKE 1 TABLET EVERY DAY  . apixaban (ELIQUIS) 2.5 MG TABS tablet Take 1 tablet (2.5 mg total) by mouth 2 (two) times daily.  Marland Kitchen aspirin 81 MG tablet Take 81 mg by mouth daily.  . Calcium Carb-Cholecalciferol (CALCIUM-VITAMIN D) 500-200 MG-UNIT tablet Take 1 tablet by mouth daily.   . fluticasone furoate-vilanterol (BREO ELLIPTA) 100-25 MCG/INH AEPB Inhale 1 puff into the lungs daily.  . furosemide (LASIX) 40 MG tablet Take 1 tablet (40 mg total) by mouth daily.  . Multiple Vitamin (MULTIVITAMIN) tablet Take 1 tablet by mouth.   . pravastatin (PRAVACHOL) 20 MG tablet Take 1 tablet (20 mg total) by mouth daily.  Marland Kitchen umeclidinium bromide (INCRUSE ELLIPTA) 62.5 MCG/INH AEPB Inhale 1 puff into the lungs daily.     Allergies:   Lisinopril   Social History   Socioeconomic History  . Marital status: Married    Spouse name: Not on file  . Number of children: Not on file  . Years of education: Not on file  . Highest education level: Not on file  Occupational History  . Not on file  Social Needs  . Financial resource strain: Not on file  . Food  insecurity:    Worry: Not on file    Inability: Not on file  . Transportation needs:    Medical: Not on file    Non-medical: Not on file  Tobacco Use  . Smoking status: Former Smoker    Packs/day: 1.00    Years: 25.00    Pack years: 25.00    Types: Cigarettes    Last attempt to quit: 11/30/2007    Years since quitting: 10.3  . Smokeless tobacco: Never Used  Substance and Sexual Activity  . Alcohol use: No  . Drug use: No  . Sexual activity: Not on file  Lifestyle  . Physical activity:    Days per week: Not on file    Minutes per session: Not on file  . Stress: Not on file  Relationships  . Social connections:    Talks on  phone: Not on file    Gets together: Not on file    Attends religious service: Not on file    Active member of club or organization: Not on file    Attends meetings of clubs or organizations: Not on file    Relationship status: Not on file  Other Topics Concern  . Not on file  Social History Narrative  . Not on file     Family History: The patient's family history includes COPD in his father. ROS:   Please see the history of present illness.    All 14 point review of systems negative except as described per history of present illness  EKGs/Labs/Other Studies Reviewed:      Recent Labs: 10/18/2017: TSH 2.67 12/10/2017: B Natriuretic Peptide 1,224.9 12/28/2017: Hemoglobin 13.6; Platelets 197.0 01/02/2018: NT-Pro BNP 3,493 01/28/2018: ALT 19; BUN 24; Creatinine, Ser 2.20; Potassium 4.2; Sodium 139  Recent Lipid Panel No results found for: CHOL, TRIG, HDL, CHOLHDL, VLDL, LDLCALC, LDLDIRECT  Physical Exam:    VS:  BP 104/74   Pulse 84   Wt 169 lb (76.7 kg)   SpO2 90%   BMI 27.28 kg/m     Wt Readings from Last 3 Encounters:  04/01/18 169 lb (76.7 kg)  03/21/18 166 lb (75.3 kg)  02/07/18 159 lb (72.1 kg)     GEN:  Well nourished, well developed in no acute distress HEENT: Normal NECK: No JVD; No carotid bruits LYMPHATICS: No lymphadenopathy CARDIAC: RRR, no murmurs, no rubs, no gallops RESPIRATORY: Poor air entry bilaterally few crackles bases ABDOMEN: Soft, non-tender, non-distended MUSCULOSKELETAL:  No edema; No deformity  SKIN: Warm and dry LOWER EXTREMITIES: 1+ swelling NEUROLOGIC:  Alert and oriented x 3 PSYCHIATRIC:  Normal affect   ASSESSMENT:    1. Chronic combined systolic and diastolic congestive heart failure ([place])   2. Dilated cardiomyopathy (HCC)   3. Paroxysmal atrial fibrillation ([place])   4. Centrilobular emphysema ([place])    PLAN:    In order of problems listed above:  1. Systolic and diastolic congestive heart failure.  [place] Heart Association  3 now mildly decompensated.  I will ask him to have Chem-7 as well as proBNP done based on that we will decide to augment his diuresis.  I will also start hydralazine 10 mg 3 times daily with his kidney dysfunction the only option we have left is vasodilatation therefore hydralazine and isosorbide will be started today only hydralazine.  He does have ejection fraction 30 to 35% therefore if there is no improvement with medical therapy ICD should be considered. 2. Paroxysmal atrial fibrillation.  Seems to be regular denies  having a palpitation we will do EKG to check the rhythm if rhythm is normal will reduce dose of amiodarone 200 mg daily which could be beneficial from his long point review. 3. Cardiomyopathy: Difficult issue to manage because of kidney dysfunction low blood pressure and advanced lung disease.  He is not on beta-blocker because of lung problem he is not on ACE inhibitor or ARB Entresto because of kidney dysfunction.  We will try to initiate vasodilatation but his low blood pressure may prevent this from increasing dosages of those medications. 4. COPD managed excellently by pulmonary team.   Medication Adjustments/Labs and Tests Ordered: Current medicines are reviewed at length with the patient today.  Concerns regarding medicines are outlined above.  No orders of the defined types were placed in this encounter.  Medication changes: No orders of the defined types were placed in this encounter.   Signed, Park Liter, MD, [facility] 04/01/2018 10:57 AM    [place]

## 2018-04-02 LAB — BASIC METABOLIC PANEL
BUN/Creatinine Ratio: 9 — ABNORMAL LOW (ref 10–24)
BUN: 23 mg/dL (ref 8–27)
CALCIUM: 9.7 mg/dL (ref 8.6–10.2)
CHLORIDE: 104 mmol/L (ref 96–106)
CO2: 21 mmol/L (ref 20–29)
Creatinine, Ser: 2.59 mg/dL — ABNORMAL HIGH (ref 0.76–1.27)
GFR calc non Af Amer: 22 mL/min/{1.73_m2} — ABNORMAL LOW (ref >59)
GFR, EST AFRICAN AMERICAN: 26 mL/min/{1.73_m2} — AB (ref >59)
Glucose: 95 mg/dL (ref 65–99)
POTASSIUM: 5 mmol/L (ref 3.5–5.2)
Sodium: 141 mmol/L (ref 134–144)

## 2018-04-02 LAB — CBC
HEMATOCRIT: 34.7 % — AB (ref 37.5–51.0)
HEMOGLOBIN: 12.1 g/dL — AB (ref 13.0–17.7)
MCH: 29 pg (ref 26.6–33.0)
MCHC: 34.9 g/dL (ref 31.5–35.7)
MCV: 83 fL (ref 79–97)
PLATELETS: 188 10*3/uL (ref 150–450)
RBC: 4.17 x10E6/uL (ref 4.14–5.80)
RDW: 15.7 % — ABNORMAL HIGH (ref 12.3–15.4)
WBC: 4 10*3/uL (ref 3.4–10.8)

## 2018-04-02 LAB — PRO B NATRIURETIC PEPTIDE: NT-Pro BNP: 4656 pg/mL — ABNORMAL HIGH (ref 0–486)

## 2018-04-03 ENCOUNTER — Telehealth: Payer: Self-pay | Admitting: Emergency Medicine

## 2018-04-03 MED ORDER — ISOSORBIDE MONONITRATE ER 30 MG PO TB24: 30.0000 mg | ORAL_TABLET | Freq: Every day | ORAL | 1 refills | Status: DC

## 2018-04-03 NOTE — Telephone Encounter (Signed)
Patient's wife informed of lab results and for patient to start imdur 30 mg daily. She verbally understands and will inform patient.

## 2018-05-06 ENCOUNTER — Other Ambulatory Visit: Payer: Self-pay | Admitting: Internal Medicine

## 2018-05-06 ENCOUNTER — Encounter: Payer: Self-pay | Admitting: Family Medicine

## 2018-05-06 ENCOUNTER — Ambulatory Visit (HOSPITAL_BASED_OUTPATIENT_CLINIC_OR_DEPARTMENT_OTHER)
Admission: RE | Admit: 2018-05-06 | Discharge: 2018-05-06 | Disposition: A | Discharge status: Home / Self Care | Payer: Medicare HMO | Source: Ambulatory Visit | Attending: Family Medicine | Admitting: Family Medicine

## 2018-05-06 ENCOUNTER — Ambulatory Visit (INDEPENDENT_AMBULATORY_CARE_PROVIDER_SITE_OTHER): Payer: Medicare HMO | Admitting: Family Medicine

## 2018-05-06 ENCOUNTER — Telehealth: Payer: Self-pay | Admitting: *Deleted

## 2018-05-06 VITALS — BP 128/82 | HR 91 | Temp 97.9°F | Ht 67.0 in | Wt 186.0 lb

## 2018-05-06 DIAGNOSIS — R600 Localized edema: Secondary | ICD-10-CM | POA: Insufficient documentation

## 2018-05-06 MED ORDER — ALBUTEROL SULFATE (2.5 MG/3ML) 0.083% IN NEBU: 2.5000 mg | INHALATION_SOLUTION | Freq: Four times a day (QID) | RESPIRATORY_TRACT | 0 refills | Status: DC | PRN

## 2018-05-06 MED ORDER — POTASSIUM CHLORIDE ER 10 MEQ PO TBCR: 10.0000 meq | EXTENDED_RELEASE_TABLET | Freq: Every day | ORAL | 3 refills | Status: DC

## 2018-05-06 MED ORDER — FUROSEMIDE 40 MG PO TABS: 40.0000 mg | ORAL_TABLET | Freq: Every day | ORAL | 3 refills | Status: DC

## 2018-05-06 NOTE — Progress Notes (Signed)
Chief Complaint  Patient presents with  . Edema    lower extremity    Subjective: Patient is a 83 y.o. male here for LE edema.  2 weeks has been having Le edema b/l. Ran out of Lasix then. Has gained wt. Feeling more sluggish. +B/l thigh pain on the inside. No hx of clots. No CP or SOB.   ROS: Heart: Denies chest pain  Lungs: Denies SOB   Past Medical History:  Diagnosis Date  . Allergy   . Aortic atherosclerosis ([place])   . Arthropathy   . CAD (coronary artery disease)   . Cardiomyopathy ([place])   . Chronic kidney disease   . Colon polyp   . COPD (chronic obstructive pulmonary disease) ([place])   . Diabetes ([place])   . Diabetic neuropathy ([place])   . Heart murmur   . Hypercholesterolemia   . Hypertension    pulmonary  . LVH (left ventricular hypertrophy)   . Lymphoma ([place])   . Mitral regurgitation   . Prostate cancer ([place])   . Pulmonary hypertension ([place])   . Tricuspid regurgitation     Objective: BP 128/82 (BP Location: Left Arm, Patient Position: Sitting, Cuff Size: Normal)   Pulse 91   Temp 97.9 F (36.6 C) (Oral)   Ht 5\' 7"  (1.702 m)   Wt 186 lb (84.4 kg)   SpO2 93%   BMI 29.13 kg/m  General: Awake, appears stated age HEENT: MMM, EOMi Heart: RRR, 3+ pitting edema up to mid thigh Lungs: CTAB, no rales, wheezes or rhonchi. No accessory muscle use MSK: +inner thigh ttp, no cal pain Psych: Age appropriate judgment and insight, normal affect and mood  Assessment and Plan: Lower extremity edema - Plan: furosemide (LASIX) 40 MG tablet, US Venous Img Lower Bilateral, Basic metabolic panel  Orders as above. Reorder Lasix, he has K, will reck labs in 1 week. Ck duplex as d dimer has higher likelihood to be positive given his age.  The patient and his daughter voiced understanding and agreement to the plan.  [place], DO 05/06/18  12:10 PM

## 2018-05-06 NOTE — Patient Instructions (Signed)
Go back on your Lasix.  Let's recheck your potassium and kidney function in 1 week.  We will be in contact regarding your imaging.   Let us know if you need anything.

## 2018-05-06 NOTE — Progress Notes (Signed)
Pre visit review using our clinic review tool, if applicable. No additional management support is needed unless otherwise documented below in the visit note. 

## 2018-05-06 NOTE — Telephone Encounter (Signed)
Patient called to the [facility] asking about his potassium and that it was not sent in.  Patient was seen today and in the assessment and plan, it was stated that patient has K.  Potassium refill sent in for patient and patient wife notified that it was sent in.

## 2018-05-13 ENCOUNTER — Other Ambulatory Visit: Payer: Self-pay | Admitting: Family Medicine

## 2018-05-13 ENCOUNTER — Other Ambulatory Visit (INDEPENDENT_AMBULATORY_CARE_PROVIDER_SITE_OTHER): Payer: Medicare HMO

## 2018-05-13 DIAGNOSIS — E875 Hyperkalemia: Secondary | ICD-10-CM

## 2018-05-13 DIAGNOSIS — R600 Localized edema: Secondary | ICD-10-CM | POA: Diagnosis not present

## 2018-05-13 LAB — BASIC METABOLIC PANEL
BUN: 22 mg/dL (ref 6–23)
CHLORIDE: 101 meq/L (ref 96–112)
CO2: 30 mEq/L (ref 19–32)
Calcium: 10.1 mg/dL (ref 8.4–10.5)
Creatinine, Ser: 2.89 mg/dL — ABNORMAL HIGH (ref 0.40–1.50)
GFR: 27.05 mL/min — AB (ref >60.00)
Glucose, Bld: 102 mg/dL — ABNORMAL HIGH (ref 70–99)
POTASSIUM: 5.2 meq/L — AB (ref 3.5–5.1)
SODIUM: 142 meq/L (ref 135–145)

## 2018-05-16 ENCOUNTER — Other Ambulatory Visit (INDEPENDENT_AMBULATORY_CARE_PROVIDER_SITE_OTHER): Payer: Medicare HMO

## 2018-05-16 DIAGNOSIS — E875 Hyperkalemia: Secondary | ICD-10-CM

## 2018-05-16 LAB — BASIC METABOLIC PANEL
BUN: 24 mg/dL — ABNORMAL HIGH (ref 6–23)
CO2: 23 mEq/L (ref 19–32)
Calcium: 9.7 mg/dL (ref 8.4–10.5)
Chloride: 101 mEq/L (ref 96–112)
Creatinine, Ser: 2.81 mg/dL — ABNORMAL HIGH (ref 0.40–1.50)
GFR: 27.94 mL/min — ABNORMAL LOW (ref >60.00)
Glucose, Bld: 82 mg/dL (ref 70–99)
Potassium: 4.4 mEq/L (ref 3.5–5.1)
Sodium: 137 mEq/L (ref 135–145)

## 2018-05-16 NOTE — Addendum Note (Signed)
Addended by: Georjean Mode on: 05/16/2018 12:08 PM   Modules accepted: Orders

## 2018-05-21 ENCOUNTER — Other Ambulatory Visit: Payer: Self-pay | Admitting: Pulmonary Disease

## 2018-05-21 ENCOUNTER — Other Ambulatory Visit: Payer: Self-pay | Admitting: Family Medicine

## 2018-05-21 ENCOUNTER — Ambulatory Visit: Payer: Medicare HMO | Admitting: Cardiology

## 2018-05-21 DIAGNOSIS — Z23 Encounter for immunization: Secondary | ICD-10-CM

## 2018-05-21 NOTE — Addendum Note (Signed)
Addended by: Georjean Mode on: 05/21/2018 05:18 PM   Modules accepted: Orders

## 2018-05-28 ENCOUNTER — Other Ambulatory Visit: Payer: Self-pay

## 2018-05-28 DIAGNOSIS — R600 Localized edema: Secondary | ICD-10-CM

## 2018-05-28 MED ORDER — FUROSEMIDE 40 MG PO TABS: 40.0000 mg | ORAL_TABLET | Freq: Every day | ORAL | 0 refills | Status: DC

## 2018-05-29 ENCOUNTER — Other Ambulatory Visit: Payer: Self-pay | Admitting: Emergency Medicine

## 2018-05-29 MED ORDER — POTASSIUM CHLORIDE ER 10 MEQ PO TBCR: 10.0000 meq | EXTENDED_RELEASE_TABLET | Freq: Every day | ORAL | 0 refills | Status: DC

## 2018-06-04 ENCOUNTER — Encounter: Payer: Self-pay | Admitting: Cardiology

## 2018-06-04 ENCOUNTER — Ambulatory Visit: Payer: Medicare HMO | Admitting: Cardiology

## 2018-06-04 VITALS — BP 102/66 | HR 80 | Ht 66.0 in | Wt 159.0 lb

## 2018-06-04 DIAGNOSIS — I5042 Chronic combined systolic (congestive) and diastolic (congestive) heart failure: Secondary | ICD-10-CM | POA: Diagnosis not present

## 2018-06-04 DIAGNOSIS — I340 Nonrheumatic mitral (valve) insufficiency: Secondary | ICD-10-CM

## 2018-06-04 DIAGNOSIS — I420 Dilated cardiomyopathy: Secondary | ICD-10-CM

## 2018-06-04 DIAGNOSIS — R600 Localized edema: Secondary | ICD-10-CM | POA: Diagnosis not present

## 2018-06-04 MED ORDER — FUROSEMIDE 40 MG PO TABS: 40.0000 mg | ORAL_TABLET | Freq: Every day | ORAL | 0 refills | Status: DC

## 2018-06-04 NOTE — Patient Instructions (Signed)
Medication Instructions:  Your physician recommends that you continue on your current medications as directed. Please refer to the Current Medication list given to you today.  **Please contact our office to provide an updated medication list when you get home today.   If you need a refill on your cardiac medications before your next appointment, please call your pharmacy.   Lab work: None  If you have labs (blood work) drawn today and your tests are completely normal, you will receive your results only by: Marland Kitchen MyChart Message (if you have MyChart) OR . A paper copy in the mail If you have any lab test that is abnormal or we need to change your treatment, we will call you to review the results.  Testing/Procedures: Your physician has requested that you have an echocardiogram. Echocardiography is a painless test that uses sound waves to create images of your heart. It provides your doctor with information about the size and shape of your heart and how well your heart's chambers and valves are working. This procedure takes approximately one hour. There are no restrictions for this procedure.  Follow-Up: At [facility], you and your health needs are our priority.  As part of our continuing mission to provide you with exceptional heart care, we have created designated Provider Care Teams.  These Care Teams include your primary Cardiologist (physician) and Advanced Practice Providers (APPs -  Physician Assistants and Nurse Practitioners) who all work together to provide you with the care you need, when you need it. You will need a follow up appointment in 1 months.        Echocardiogram An echocardiogram is a procedure that uses painless sound waves (ultrasound) to produce an image of the heart. Images from an echocardiogram can provide important information about:  Signs of coronary artery disease (CAD).  Aneurysm detection. An aneurysm is a weak or damaged part of an artery wall that  bulges out from the normal force of blood pumping through the body.  Heart size and shape. Changes in the size or shape of the heart can be associated with certain conditions, including heart failure, aneurysm, and CAD.  Heart muscle function.  Heart valve function.  Signs of a past heart attack.  Fluid buildup around the heart.  Thickening of the heart muscle.  A tumor or infectious growth around the heart valves. Tell a health care provider about:  Any allergies you have.  All medicines you are taking, including vitamins, herbs, eye drops, creams, and over-the-counter medicines.  Any blood disorders you have.  Any surgeries you have had.  Any medical conditions you have.  Whether you are pregnant or may be pregnant. What are the risks? Generally, this is a safe procedure. However, problems may occur, including:  Allergic reaction to dye (contrast) that may be used during the procedure. What happens before the procedure? No specific preparation is needed. You may eat and drink normally. What happens during the procedure?   An IV tube may be inserted into one of your veins.  You may receive contrast through this tube. A contrast is an injection that improves the quality of the pictures from your heart.  A gel will be applied to your chest.  A wand-like tool (transducer) will be moved over your chest. The gel will help to transmit the sound waves from the transducer.  The sound waves will harmlessly bounce off of your heart to allow the heart images to be captured in real-time motion. The images will  be recorded on a computer. The procedure may vary among health care providers and hospitals. What happens after the procedure?  You may return to your normal, everyday life, including diet, activities, and medicines, unless your health care provider tells you not to do that. Summary  An echocardiogram is a procedure that uses painless sound waves (ultrasound) to produce  an image of the heart.  Images from an echocardiogram can provide important information about the size and shape of your heart, heart muscle function, heart valve function, and fluid buildup around your heart.  You do not need to do anything to prepare before this procedure. You may eat and drink normally.  After the echocardiogram is completed, you may return to your normal, everyday life, unless your health care provider tells you not to do that. This information is not intended to replace advice given to you by your health care provider. Make sure you discuss any questions you have with your health care provider. Document Released: 04/21/2000 Document Revised: 05/27/2016 Document Reviewed: 05/27/2016 Elsevier Interactive Patient Education  2019 [employer].

## 2018-06-04 NOTE — Progress Notes (Signed)
Cardiology Office Note:    Date:  06/04/2018   ID:  Eric Herring, DOB 1934-05-31, MRN [id number]  PCP:  Shelda Pal, DO  Cardiologist:  Jenne Campus, MD    Referring MD: Shelda Pal*   Chief Complaint  Patient presents with  . Follow-up  Feeling fine  History of Present Illness:    Eric Herring is a 84 y.o. male with cardiomyopathy New York Heart Association class III, ejection fraction 30 to 35%.  Comes today to my office for follow-up with his wife as well as with his daughter.  He is doing well he did not take Lasix today because he is worried about the need to urinate he does not know medication he takes and there was some confusion about what he truly takes he refused to take Eliquis because this is too expensive he also told me today that he does not want to have defibrillator.  Overall complain of having some fatigue and tiredness swelling of lower extremities but otherwise well  Past Medical History:  Diagnosis Date  . Allergy   . Aortic atherosclerosis ([place])   . Arthropathy   . CAD (coronary artery disease)   . Cardiomyopathy ([place])   . Chronic kidney disease   . Colon polyp   . COPD (chronic obstructive pulmonary disease) ([place])   . Diabetes ([place])   . Diabetic neuropathy ([place])   . Heart murmur   . Hypercholesterolemia   . Hypertension    pulmonary  . LVH (left ventricular hypertrophy)   . Lymphoma ([place])   . Mitral regurgitation   . Prostate cancer ([place])   . Pulmonary hypertension ([place])   . Tricuspid regurgitation     Past Surgical History:  Procedure Laterality Date  . HEMORROIDECTOMY     pt does not remember year    Current Medications: Current Meds  Medication Sig  . albuterol (PROVENTIL HFA;VENTOLIN HFA) 108 (90 Base) MCG/ACT inhaler Inhale 2 puffs into the lungs every 6 (six) hours as needed for wheezing or shortness of breath.  Marland Kitchen albuterol (PROVENTIL) (2.5 MG/3ML) 0.083% nebulizer solution Take 3 mLs (2.5 mg total) by  nebulization every 6 (six) hours as needed for wheezing or shortness of breath.  Marland Kitchen amiodarone (PACERONE) 200 MG tablet TAKE 1 TABLET EVERY DAY  . aspirin 81 MG tablet Take 81 mg by mouth daily.  . Calcium Carb-Cholecalciferol (CALCIUM-VITAMIN D) 500-200 MG-UNIT tablet Take 1 tablet by mouth daily.   . fluticasone furoate-vilanterol (BREO ELLIPTA) 100-25 MCG/INH AEPB Inhale 1 puff into the lungs daily.  . furosemide (LASIX) 40 MG tablet Take 1 tablet (40 mg total) by mouth daily.  . Multiple Vitamin (MULTIVITAMIN) tablet Take 1 tablet by mouth.   . potassium chloride (K-DUR) 10 MEQ tablet Take 1 tablet (10 mEq total) by mouth daily.  . pravastatin (PRAVACHOL) 20 MG tablet Take 1 tablet (20 mg total) by mouth daily.  Marland Kitchen umeclidinium bromide (INCRUSE ELLIPTA) 62.5 MCG/INH AEPB Inhale 1 puff into the lungs daily.  . [DISCONTINUED] furosemide (LASIX) 40 MG tablet Take 1 tablet (40 mg total) by mouth daily.  . [DISCONTINUED] furosemide (LASIX) 40 MG tablet Take 1 tablet (40 mg total) by mouth daily.     Allergies:   Lisinopril   Social History   Socioeconomic History  . Marital status: Married    Spouse name: Not on file  . Number of children: Not on file  . Years of education: Not on file  . Highest education level: Not  on file  Occupational History  . Not on file  Social Needs  . Financial resource strain: Not on file  . Food insecurity:    Worry: Not on file    Inability: Not on file  . Transportation needs:    Medical: Not on file    Non-medical: Not on file  Tobacco Use  . Smoking status: Former Smoker    Packs/day: 1.00    Years: 25.00    Pack years: 25.00    Types: Cigarettes    Last attempt to quit: 11/30/2007    Years since quitting: 10.5  . Smokeless tobacco: Never Used  Substance and Sexual Activity  . Alcohol use: No  . Drug use: No  . Sexual activity: Not on file  Lifestyle  . Physical activity:    Days per week: Not on file    Minutes per session: Not on file    . Stress: Not on file  Relationships  . Social connections:    Talks on phone: Not on file    Gets together: Not on file    Attends religious service: Not on file    Active member of club or organization: Not on file    Attends meetings of clubs or organizations: Not on file    Relationship status: Not on file  Other Topics Concern  . Not on file  Social History Narrative  . Not on file     Family History: The patient's family history includes COPD in his father. ROS:   Please see the history of present illness.    All 14 point review of systems negative except as described per history of present illness  EKGs/Labs/Other Studies Reviewed:      Recent Labs: 10/18/2017: TSH 2.67 12/10/2017: B Natriuretic Peptide 1,224.9 01/28/2018: ALT 19 04/01/2018: Hemoglobin 12.1; NT-Pro BNP 4,656; Platelets 188 05/16/2018: BUN 24; Creatinine, Ser 2.81; Potassium 4.4; Sodium 137  Recent Lipid Panel No results found for: CHOL, TRIG, HDL, CHOLHDL, VLDL, LDLCALC, LDLDIRECT  Physical Exam:    VS:  BP 102/66 (BP Location: Right Arm, Patient Position: Sitting, Cuff Size: Normal)   Pulse 80   Ht 5\' 6"  (1.676 m)   Wt 159 lb 0.4 oz (72.1 kg)   SpO2 97%   BMI 25.67 kg/m     Wt Readings from Last 3 Encounters:  06/04/18 159 lb 0.4 oz (72.1 kg)  05/06/18 186 lb (84.4 kg)  04/01/18 169 lb (76.7 kg)     GEN:  Well nourished, well developed in no acute distress HEENT: Normal NECK: No JVD; No carotid bruits LYMPHATICS: No lymphadenopathy CARDIAC: RRR, holosystolic murmur grade 2/6 best heard at left border of sternum and apex, no rubs, no gallops RESPIRATORY:  Clear to auscultation without rales, wheezing or rhonchi  ABDOMEN: Soft, non-tender, non-distended MUSCULOSKELETAL:  No edema; No deformity  SKIN: Warm and dry LOWER EXTREMITIES: 1+ pitting edema NEUROLOGIC:  Alert and oriented x 3 PSYCHIATRIC:  Normal affect   ASSESSMENT:    1. Dilated cardiomyopathy ([place])   2. Lower extremity  edema   3. Chronic combined systolic and diastolic congestive heart failure ([place])   4. Nonrheumatic mitral valve regurgitation    PLAN:    In order of problems listed above:  1. Dilated cardiomyopathy I will ask him to have repeated echocardiogram to check on left ventricular ejection fraction.  Confusion about his medication he need to be taking hydralazine as well as isosorbide does not take it he thinks he will call us  back after returning home and tell us exactly what medication he takes.  We will continue with diuretic.  He does have baseline elevation of creatinine which appears to be slowly climbing.  He told me straight today that he does not want to have any pacemaker no defibrillator I explained again to him as the purpose of this device is he still does not want to have it 2. Mitral regurgitation which is probably related to his cardiomyopathy echocardiogram will be done to reassess this issue. 3. Lower extremities edema we will continue present management.   Medication Adjustments/Labs and Tests Ordered: Current medicines are reviewed at length with the patient today.  Concerns regarding medicines are outlined above.  Orders Placed This Encounter  Procedures  . ECHOCARDIOGRAM COMPLETE   Medication changes:  Meds ordered this encounter  Medications  . DISCONTD: furosemide (LASIX) 40 MG tablet    Sig: Take 1 tablet (40 mg total) by mouth daily.    Dispense:  30 tablet    Refill:  0  . furosemide (LASIX) 40 MG tablet    Sig: Take 1 tablet (40 mg total) by mouth daily.    Dispense:  90 tablet    Refill:  0    Signed, Park Liter, MD, [facility] 06/04/2018 12:27 PM    [place]ax

## 2018-06-05 ENCOUNTER — Telehealth: Payer: Self-pay | Admitting: *Deleted

## 2018-06-05 NOTE — Telephone Encounter (Signed)
Patient's daughter called back yesterday afternoon after appointment to make [place] aware that the patient is taking all medications as prescribed. Medication list has been updated accordingly.

## 2018-06-07 ENCOUNTER — Other Ambulatory Visit: Payer: Self-pay | Admitting: Family Medicine

## 2018-06-07 MED ORDER — ALBUTEROL SULFATE (2.5 MG/3ML) 0.083% IN NEBU: 2.5000 mg | INHALATION_SOLUTION | Freq: Four times a day (QID) | RESPIRATORY_TRACT | 0 refills | Status: DC | PRN

## 2018-06-19 ENCOUNTER — Ambulatory Visit (HOSPITAL_BASED_OUTPATIENT_CLINIC_OR_DEPARTMENT_OTHER)
Admission: RE | Admit: 2018-06-19 | Discharge: 2018-06-19 | Disposition: A | Discharge status: Home / Self Care | Payer: Medicare HMO | Source: Ambulatory Visit | Attending: Cardiology | Admitting: Cardiology

## 2018-06-19 DIAGNOSIS — R600 Localized edema: Secondary | ICD-10-CM | POA: Diagnosis present

## 2018-06-19 DIAGNOSIS — I420 Dilated cardiomyopathy: Secondary | ICD-10-CM | POA: Insufficient documentation

## 2018-06-19 DIAGNOSIS — I5042 Chronic combined systolic (congestive) and diastolic (congestive) heart failure: Secondary | ICD-10-CM | POA: Insufficient documentation

## 2018-06-19 DIAGNOSIS — I340 Nonrheumatic mitral (valve) insufficiency: Secondary | ICD-10-CM | POA: Insufficient documentation

## 2018-06-20 ENCOUNTER — Ambulatory Visit (INDEPENDENT_AMBULATORY_CARE_PROVIDER_SITE_OTHER): Payer: Medicare HMO | Admitting: Pulmonary Disease

## 2018-06-20 ENCOUNTER — Encounter: Payer: Self-pay | Admitting: Pulmonary Disease

## 2018-06-20 VITALS — BP 104/62 | HR 76 | Ht 67.0 in | Wt 168.0 lb

## 2018-06-20 DIAGNOSIS — G4734 Idiopathic sleep related nonobstructive alveolar hypoventilation: Secondary | ICD-10-CM

## 2018-06-20 DIAGNOSIS — J432 Centrilobular emphysema: Secondary | ICD-10-CM

## 2018-06-20 DIAGNOSIS — J479 Bronchiectasis, uncomplicated: Secondary | ICD-10-CM | POA: Diagnosis not present

## 2018-06-20 MED ORDER — FLUTICASONE-UMECLIDIN-VILANT 100-62.5-25 MCG/INH IN AEPB: 1.0000 | INHALATION_SPRAY | Freq: Every day | RESPIRATORY_TRACT | 5 refills | Status: DC

## 2018-06-20 NOTE — Patient Instructions (Signed)
Stop breo and incruse  Trelegy one puff daily, and rinse your mouth after each use  Will arrange for overnight oxygen test at home and call with results  Follow up in 6 months

## 2018-06-20 NOTE — Progress Notes (Signed)
[facility]  Chief Complaint  Patient presents with  . Follow-up    Pt is doing well overall, having some SOB with exertion.    Constitutional:  BP 104/62 (BP Location: Left Arm, Cuff Size: Normal)   Pulse 76   Ht 5\' 7"  (1.702 m)   Wt 168 lb (76.2 kg)   SpO2 93%   BMI 26.31 kg/m   Past Medical History:  Lymphoma 2002, Prostate cancer 2011, CKD, A fib, CHF, CAD, DM with neuropathy, HLD, HTN, Mitral regurgitation  Brief Summary:  Eric Herring is a 84 y.o. male former smoker with COPD and bronchiectasis.  Followed in [facility] office.  Previously seen by Dr. Elsworth Soho.  Last seen by Rexene Edison in November.  Still using oxygen at night.  Gets nose bleeds from nasal cannula.  Has humidifier for oxygen.  Concerned about expense of inhalers.    Gets winded if he walks too fast.  Okay at steady pace.  Not having much cough.  Not having wheeze, fever, chest pain, or hemoptysis.  Wears compression hose to help with ankle swelling.  No issues with his swallowing that he is aware of.    He has stiffness in his hands and numbness in his hands.  Physical Exam:   Appearance - thin  ENMT - clear nasal mucosa, midline nasal  septum, no oral exudates, no LAN, trachea midline  Respiratory - normal chest wall, normal respiratory effort, no accessory muscle use, no wheeze/rales  CV - s1s2 regular rate and rhythm, no murmurs, ankle peripheral edema, radial pulses symmetric  GI - soft, non tender, no masses  Lymph - no adenopathy noted in neck and axillary areas  MSK - normal gait  Ext - no cyanosis, clubbing, or joint inflammation noted  Skin - no rashes, lesions, or ulcers  Neuro - normal strength, oriented x 3  Psych - normal mood and affect   Assessment/Plan:   COPD with emphysema and bronchiectasis. - will change from breo/incruse to trelegy - financial assistance form for trelegy given to him - prn albuterol  Nocturnal  hypoxia. - reports oxygen was started after episode of pneumonia, and he is sure he still needs supplemental oxygen - will arrange for ONO with room air  Systolic CHF, Mitral regurgitation, A fib. - followed by Dr. Agustin Cree with Cardiology - patient was apparently concerned about expense of anticoagulation medication >> advised him to discuss further with cardiology  Neuropathy. - likely from previous chemotherapy treatment for lymphoma and diabetes - advised him to d/w his PCP   Patient Instructions  Stop breo and incruse  Trelegy one puff daily, and rinse your mouth after each use  Will arrange for overnight oxygen test at home and call with results  Follow up in 6 months  A total of  29 minutes were spent face to face with the patient and more than half of that time involved counseling or coordination of care.   Chesley Mires, MD [facility] Pager: [phone] 06/20/2018, 10:29 AM  Flow Sheet     Pulmonary tests:  Spirometry 05/13/15 >> FEV1 1.0 (41%), FEV1% 43  Chest imaging:  CT chest 12/10/17 >> atherosclerosis, moderate centrilobular and paraseptal emphysema, mild cylindrical BTX more in lower lobes, scattered nodules stable since 2016  Cardiac tests:  Echo 06/19/18 >> EF 35 to 40%, severe septal hypertrophy, mod MR  Medications:   Allergies as of 06/20/2018      Reactions   Lisinopril  Anaphylaxis, Swelling         Medication List       Accurate as of June 20, 2018 10:29 AM. Always use your most recent med list.        albuterol 108 (90 Base) MCG/ACT inhaler Commonly known as:  PROVENTIL HFA;VENTOLIN HFA Inhale 2 puffs into the lungs every 6 (six) hours as needed for wheezing or shortness of breath.   albuterol (2.5 MG/3ML) 0.083% nebulizer solution Commonly known as:  PROVENTIL Take 3 mLs (2.5 mg total) by nebulization every 6 (six) hours as needed for wheezing or shortness of breath (3 month supply).   amiodarone 200 MG  tablet Commonly known as:  PACERONE TAKE 1 TABLET EVERY DAY   aspirin 81 MG tablet Take 81 mg by mouth daily.   calcium-vitamin D 500-200 MG-UNIT tablet Take 1 tablet by mouth daily.   fluticasone furoate-vilanterol 100-25 MCG/INH Aepb Commonly known as:  BREO ELLIPTA Inhale 1 puff into the lungs daily.   furosemide 40 MG tablet Commonly known as:  LASIX Take 1 tablet (40 mg total) by mouth daily.   isosorbide mononitrate 30 MG 24 hr tablet Commonly known as:  IMDUR Take 1 tablet (30 mg total) by mouth daily.   multivitamin tablet Take 1 tablet by mouth.   potassium chloride 10 MEQ tablet Commonly known as:  K-DUR Take 1 tablet (10 mEq total) by mouth daily.   pravastatin 20 MG tablet Commonly known as:  PRAVACHOL Take 1 tablet (20 mg total) by mouth daily.   umeclidinium bromide 62.5 MCG/INH Aepb Commonly known as:  INCRUSE ELLIPTA Inhale 1 puff into the lungs daily.       Past Surgical History:  He  has a past surgical history that includes Hemorroidectomy.  Family History:  His family history includes COPD in his father.  Social History:  He  reports that he quit smoking about 10 years ago. His smoking use included cigarettes. He has a 25.00 pack-year smoking history. He has never used smokeless tobacco. He reports that he does not drink alcohol or use drugs.

## 2018-07-09 ENCOUNTER — Telehealth: Payer: Self-pay | Admitting: Family Medicine

## 2018-07-09 NOTE — Telephone Encounter (Signed)
FYI

## 2018-07-09 NOTE — Telephone Encounter (Signed)
Dr Liston Alba from [facility] called to inform Dr [name] that pt's wife died last August 04, 2022. TE sent to Dr [name] Pt's upcoming appt information called to daughter Aleda Grana [phone] (daughter is on DPR.). Dr. Zigmund Daniel suggested to make pt an appt for wellbeing check up. Informed Stephanie NT made appt for 07/10/18 at 11:15 am and to be there at 11:00 am to check in. Daughter will need to be the contact person for appointments, results, visits.

## 2018-07-10 ENCOUNTER — Ambulatory Visit (INDEPENDENT_AMBULATORY_CARE_PROVIDER_SITE_OTHER): Payer: Medicare HMO | Admitting: Family Medicine

## 2018-07-10 ENCOUNTER — Encounter: Payer: Self-pay | Admitting: Family Medicine

## 2018-07-10 VITALS — BP 104/62 | HR 88 | Temp 97.7°F | Ht 70.0 in | Wt 160.0 lb

## 2018-07-10 DIAGNOSIS — Z634 Disappearance and death of family member: Secondary | ICD-10-CM

## 2018-07-10 DIAGNOSIS — I7300 Raynaud's syndrome without gangrene: Secondary | ICD-10-CM | POA: Diagnosis not present

## 2018-07-10 DIAGNOSIS — J302 Other seasonal allergic rhinitis: Secondary | ICD-10-CM

## 2018-07-10 MED ORDER — FEXOFENADINE HCL 180 MG PO TABS: 180.0000 mg | ORAL_TABLET | Freq: Every day | ORAL | 5 refills | Status: DC

## 2018-07-10 NOTE — Progress Notes (Signed)
Chief Complaint  Patient presents with  . Follow-up    Subjective: Patient is a 84 y.o. male here for f/u. Here w daughter.   Wife just past away. He is doing well given circumstances. They were married for over 30 years.  Sleeping OK, still trying to stay busy. She passed away last week.  Has been having numbness in the tips of his fingers when it gets cold.  When he runs his fingers under warm water, things get better.  He denies ever having ulcers or sores on his fingertips.  There is no pain.  He is not having any weakness or dropping things.  The patient has a history of seasonal allergies secondary to pollen.  His symptoms are starting to flare.  He is starting to have a runny nose and some itchy eyes.  He is not currently taking anything for allergies.   ROS: Skin: No lesions Neuro: +numbness  Past Medical History:  Diagnosis Date  . Allergy   . Aortic atherosclerosis ([place])   . Arthropathy   . CAD (coronary artery disease)   . Cardiomyopathy ([place])   . Chronic kidney disease   . Colon polyp   . COPD (chronic obstructive pulmonary disease) ([place])   . Diabetes ([place])   . Diabetic neuropathy ([place])   . Heart murmur   . Hypercholesterolemia   . Hypertension    pulmonary  . LVH (left ventricular hypertrophy)   . Lymphoma ([place])   . Mitral regurgitation   . Prostate cancer ([place])   . Pulmonary hypertension ([place])   . Tricuspid regurgitation     Objective: BP 104/62 (BP Location: Left Arm, Patient Position: Sitting, Cuff Size: Normal)   Pulse 88   Temp 97.7 F (36.5 C) (Oral)   Ht 5\' 10"  (1.778 m)   Wt 160 lb (72.6 kg)   SpO2 95%   BMI 22.96 kg/m  General: Awake, appears stated age Neuro: Sensation intact to light touch Skin: No ulcerations on finger tips Heart: brisk cap refilll Lungs:. No accessory muscle use Psych: Age appropriate judgment and insight, normal affect and mood  Assessment and Plan: Bereavement  Raynaud's disease without gangrene  Seasonal  allergies - Plan: fexofenadine (ALLEGRA ALLERGY) 180 MG tablet  I am very pleased to see how he is doing today despite his wife passing away last week. Offered a medicine for the rainouts, however it is not bothering him enough to start anything.  He will let us know and we will start a low-dose of Norvasc. Start Allegra for seasonal allergies.  Will consider intranasal corticosteroid if no improvement. Follow-up as originally scheduled. The patient voiced understanding and agreement to the plan.  [place], DO 07/10/18  12:20 PM

## 2018-07-10 NOTE — Patient Instructions (Addendum)
I am so sorry for your loss. I still look forward to seeing you and always liked seeing Ms Georga Kaufmann. I will miss her. Let me know if you need anything from [place].  Let me know if you change your mind regarding going on medicine for your fingers.  Claritin (loratadine), Allegra (fexofenadine), Zyrtec (cetirizine); these are listed in order from weakest to strongest. Generic, and therefore cheaper, options are in the parentheses.   Flonase (fluticasone); nasal spray that is over the counter. 2 sprays each nostril, once daily. Aim towards the same side eye when you spray.  There are available OTC, and the generic versions, which may be cheaper, are in parentheses. Show this to a pharmacist if you have trouble finding any of these items.  Let us know if you need anything.

## 2018-07-17 ENCOUNTER — Encounter: Payer: Self-pay | Admitting: Cardiology

## 2018-07-17 ENCOUNTER — Ambulatory Visit (INDEPENDENT_AMBULATORY_CARE_PROVIDER_SITE_OTHER): Payer: Medicare HMO | Admitting: Cardiology

## 2018-07-17 ENCOUNTER — Other Ambulatory Visit: Payer: Self-pay

## 2018-07-17 VITALS — BP 134/72 | HR 72 | Ht 70.0 in | Wt 160.0 lb

## 2018-07-17 DIAGNOSIS — I340 Nonrheumatic mitral (valve) insufficiency: Secondary | ICD-10-CM

## 2018-07-17 DIAGNOSIS — I272 Pulmonary hypertension, unspecified: Secondary | ICD-10-CM

## 2018-07-17 DIAGNOSIS — I5022 Chronic systolic (congestive) heart failure: Secondary | ICD-10-CM | POA: Diagnosis not present

## 2018-07-17 DIAGNOSIS — R000 Tachycardia, unspecified: Secondary | ICD-10-CM | POA: Diagnosis not present

## 2018-07-17 MED ORDER — HYDRALAZINE HCL 10 MG PO TABS: 10.0000 mg | ORAL_TABLET | Freq: Three times a day (TID) | ORAL | 1 refills | Status: DC

## 2018-07-17 NOTE — Patient Instructions (Signed)
Medication Instructions:  Your physician has recommended you make the following change in your medication:   Start: Hydralazine 10 mg three times daily.   If you need a refill on your cardiac medications before your next appointment, please call your pharmacy.   Lab work: None.   If you have labs (blood work) drawn today and your tests are completely normal, you will receive your results only by: Marland Kitchen MyChart Message (if you have MyChart) OR . A paper copy in the mail If you have any lab test that is abnormal or we need to change your treatment, we will call you to review the results.  Testing/Procedures: None.   Follow-Up: At [facility], you and your health needs are our priority.  As part of our continuing mission to provide you with exceptional heart care, we have created designated Provider Care Teams.  These Care Teams include your primary Cardiologist (physician) and Advanced Practice Providers (APPs -  Physician Assistants and Nurse Practitioners) who all work together to provide you with the care you need, when you need it. You will need a follow up appointment in 3 months.  Please call our office 2 months in advance to schedule this appointment.  You may see Jenne Campus, MD or another member of our [place] Provider Team in [place]: Shirlee More, MD . Jyl Heinz, MD  Any Other Special Instructions Will Be Listed Below (If Applicable).  Hydralazine tablets What is this medicine? HYDRALAZINE (hye DRAL a zeen) is a type of vasodilator. It relaxes blood vessels, increasing the blood and oxygen supply to your heart. This medicine is used to treat high blood pressure. This medicine may be used for other purposes; ask your health care provider or pharmacist if you have questions. COMMON BRAND NAME(S): Apresoline What should I tell my health care provider before I take this medicine? They need to know if you have any of these conditions: -blood vessel  disease -heart disease including angina or history of heart attack -kidney or liver disease -systemic lupus erythematosus (SLE) -an unusual or allergic reaction to hydralazine, tartrazine dye, other medicines, foods, dyes, or preservatives -pregnant or trying to get pregnant -breast-feeding How should I use this medicine? Take this medicine by mouth with a glass of water. Follow the directions on the prescription label. Take your doses at regular intervals. Do not take your medicine more often than directed. Do not stop taking except on the advice of your doctor or health care professional. Talk to your pediatrician regarding the use of this medicine in children. Special care may be needed. While this drug may be prescribed for children for selected conditions, precautions do apply. Overdosage: If you think you have taken too much of this medicine contact a poison control center or emergency room at once. NOTE: This medicine is only for you. Do not share this medicine with others. What if I miss a dose? If you miss a dose, take it as soon as you can. If it is almost time for your next dose, take only that dose. Do not take double or extra doses. What may interact with this medicine? -medicines for high blood pressure -medicines for mental depression This list may not describe all possible interactions. Give your health care provider a list of all the medicines, herbs, non-prescription drugs, or dietary supplements you use. Also tell them if you smoke, drink alcohol, or use illegal drugs. Some items may interact with your medicine. What should I watch for while using  this medicine? Visit your doctor or health care professional for regular checks on your progress. Check your blood pressure and pulse rate regularly. Ask your doctor or health care professional what your blood pressure and pulse rate should be and when you should contact him or her. You may get drowsy or dizzy. Do not drive, use  machinery, or do anything that needs mental alertness until you know how this medicine affects you. Do not stand or sit up quickly, especially if you are an older patient. This reduces the risk of dizzy or fainting spells. Alcohol may interfere with the effect of this medicine. Avoid alcoholic drinks. Do not treat yourself for coughs, colds, or pain while you are taking this medicine without asking your doctor or health care professional for advice. Some ingredients may increase your blood pressure. What side effects may I notice from receiving this medicine? Side effects that you should report to your doctor or health care professional as soon as possible: -chest pain, or fast or irregular heartbeat -fever, chills, or sore throat -numbness or tingling in the hands or feet -shortness of breath -skin rash, redness, blisters or itching -stiff or swollen joints -sudden weight gain -swelling of the feet or legs -swollen lymph glands -unusual weakness Side effects that usually do not require medical attention (report to your doctor or health care professional if they continue or are bothersome): -diarrhea, or constipation -headache -loss of appetite -nausea, vomiting This list may not describe all possible side effects. Call your doctor for medical advice about side effects. You may report side effects to FDA at 1-800-FDA-1088. Where should I keep my medicine? Keep out of the reach of children. Store at room temperature between 15 and 30 degrees C (59 and 86 degrees F). Throw away any unused medicine after the expiration date. NOTE: This sheet is a summary. It may not cover all possible information. If you have questions about this medicine, talk to your doctor, pharmacist, or health care provider.  2019 Elsevier/Gold Standard (2007-09-06 15:44:58)

## 2018-07-17 NOTE — Addendum Note (Signed)
Addended by: Ashok Norris on: 07/17/2018 11:28 AM   Modules accepted: Orders

## 2018-07-17 NOTE — Progress Notes (Signed)
Cardiology Office Note:    Date:  07/17/2018   ID:  Eric Herring, DOB [birth year], MRN [id number]  PCP:  Shelda Pal, DO  Cardiologist:  Jenne Campus, MD    Referring MD: Shelda Pal*   Chief Complaint  Patient presents with  . 1 month follow up  Doing well  History of Present Illness:    Eric Herring is a 84 y.o. male with cardiomyopathy ejection fraction 35 to 40% based on last assessment.  Try to distract he lost his wife of more than 30 years do to her myocardial infarction.  She was 43 of course he is grieving after that he comes to my office with his daughter denies have any chest pain tightness squeezing pressure burning chest and tells me that cardiac wise feeling well I told him that his ejection fraction improved which is obviously good news we decided to reinitiate hydralazine 10 mg 3 times a day to even further improve the function of his heart.  He agreed he still does not want to hear anything about ICD but now he does not meet to do for it  Past Medical History:  Diagnosis Date  . Allergy   . Aortic atherosclerosis ([place])   . Arthropathy   . CAD (coronary artery disease)   . Cardiomyopathy ([place])   . Chronic kidney disease   . Colon polyp   . COPD (chronic obstructive pulmonary disease) ([place])   . Diabetes ([place])   . Diabetic neuropathy ([place])   . Heart murmur   . Hypercholesterolemia   . Hypertension    pulmonary  . LVH (left ventricular hypertrophy)   . Lymphoma ([place])   . Mitral regurgitation   . Prostate cancer ([place])   . Pulmonary hypertension ([place])   . Tricuspid regurgitation     Past Surgical History:  Procedure Laterality Date  . HEMORROIDECTOMY     pt does not remember year    Current Medications: Current Meds  Medication Sig  . albuterol (PROVENTIL HFA;VENTOLIN HFA) 108 (90 Base) MCG/ACT inhaler Inhale 2 puffs into the lungs every 6 (six) hours as needed for wheezing or shortness of breath.  Marland Kitchen albuterol  (PROVENTIL) (2.5 MG/3ML) 0.083% nebulizer solution Take 3 mLs (2.5 mg total) by nebulization every 6 (six) hours as needed for wheezing or shortness of breath (3 month supply).  Marland Kitchen amiodarone (PACERONE) 200 MG tablet TAKE 1 TABLET EVERY DAY  . aspirin 81 MG tablet Take 81 mg by mouth daily.  . Calcium Carb-Cholecalciferol (CALCIUM-VITAMIN D) 500-200 MG-UNIT tablet Take 1 tablet by mouth daily.   . fexofenadine (ALLEGRA ALLERGY) 180 MG tablet Take 1 tablet (180 mg total) by mouth daily.  . Fluticasone-Umeclidin-Vilant (TRELEGY ELLIPTA) 100-62.5-25 MCG/INH AEPB Inhale 1 puff into the lungs daily.  . furosemide (LASIX) 40 MG tablet Take 1 tablet (40 mg total) by mouth daily.  . isosorbide mononitrate (IMDUR) 30 MG 24 hr tablet Take 1 tablet (30 mg total) by mouth daily.  . Multiple Vitamin (MULTIVITAMIN) tablet Take 1 tablet by mouth.   . potassium chloride (K-DUR) 10 MEQ tablet Take 1 tablet (10 mEq total) by mouth daily.  . pravastatin (PRAVACHOL) 20 MG tablet Take 1 tablet (20 mg total) by mouth daily.     Allergies:   Lisinopril   Social History   Socioeconomic History  . Marital status: Married    Spouse name: Not on file  . Number of children: Not on file  . Years of education: Not on  file  . Highest education level: Not on file  Occupational History  . Not on file  Social Needs  . Financial resource strain: Not on file  . Food insecurity:    Worry: Not on file    Inability: Not on file  . Transportation needs:    Medical: Not on file    Non-medical: Not on file  Tobacco Use  . Smoking status: Former Smoker    Packs/day: 1.00    Years: 25.00    Pack years: 25.00    Types: Cigarettes    Last attempt to quit: 11/30/2007    Years since quitting: 10.6  . Smokeless tobacco: Never Used  Substance and Sexual Activity  . Alcohol use: No  . Drug use: No  . Sexual activity: Not on file  Lifestyle  . Physical activity:    Days per week: Not on file    Minutes per session: Not  on file  . Stress: Not on file  Relationships  . Social connections:    Talks on phone: Not on file    Gets together: Not on file    Attends religious service: Not on file    Active member of club or organization: Not on file    Attends meetings of clubs or organizations: Not on file    Relationship status: Not on file  Other Topics Concern  . Not on file  Social History Narrative  . Not on file     Family History: The patient's family history includes COPD in his father. ROS:   Please see the history of present illness.    All 14 point review of systems negative except as described per history of present illness  EKGs/Labs/Other Studies Reviewed:      Recent Labs: 10/18/2017: TSH 2.67 12/10/2017: B Natriuretic Peptide 1,224.9 01/28/2018: ALT 19 04/01/2018: Hemoglobin 12.1; NT-Pro BNP 4,656; Platelets 188 05/16/2018: BUN 24; Creatinine, Ser 2.81; Potassium 4.4; Sodium 137  Recent Lipid Panel No results found for: CHOL, TRIG, HDL, CHOLHDL, VLDL, LDLCALC, LDLDIRECT  Physical Exam:    VS:  BP 134/72   Pulse 72   Ht 5\' 10"  (1.778 m)   Wt 160 lb (72.6 kg)   SpO2 93%   BMI 22.96 kg/m     Wt Readings from Last 3 Encounters:  07/17/18 160 lb (72.6 kg)  07/10/18 160 lb (72.6 kg)  06/20/18 168 lb (76.2 kg)     GEN:  Well nourished, well developed in no acute distress HEENT: Normal NECK: No JVD; No carotid bruits LYMPHATICS: No lymphadenopathy CARDIAC: RRR, systolic murmur grade 1/6 best heard at the left border of the sternum, no rubs, no gallops RESPIRATORY:  Clear to auscultation without rales, wheezing or rhonchi  ABDOMEN: Soft, non-tender, non-distended MUSCULOSKELETAL:  No edema; No deformity  SKIN: Warm and dry LOWER EXTREMITIES: no swelling NEUROLOGIC:  Alert and oriented x 3 PSYCHIATRIC:  Normal affect   ASSESSMENT:    1. Chronic systolic congestive heart failure ([place])   2. Pulmonary hypertension, unspecified ([place])   3. Nonrheumatic mitral valve  regurgitation   4. Tachycardia    PLAN:    In order of problems listed above:  1. Chronic systolic congestive heart failure.  We will add hydralazine 10 mg 3 times daily rest of the medication will be the same 2. Mild hypertension station.  Stable continue present management 3. Rheumatic mitral valve regurgitation I think this is secondary to his cardiomyopathy.  We will follow-up hemodynamically compensated asymptomatic 4. Tachycardia denies  having any   Medication Adjustments/Labs and Tests Ordered: Current medicines are reviewed at length with the patient today.  Concerns regarding medicines are outlined above.  No orders of the defined types were placed in this encounter.  Medication changes: No orders of the defined types were placed in this encounter.   Signed, Park Liter, MD, [facility] 07/17/2018 [place]

## 2018-07-24 ENCOUNTER — Telehealth: Payer: Self-pay | Admitting: Cardiology

## 2018-07-24 NOTE — Telephone Encounter (Signed)
Raquel Sarna with [facility] called to discuss patient's need to begin coumadin therapy but is not sure who is to oversee. She asks to be called back asap because of some other issues with the patient  Raquel Sarna can be reached at [phone]

## 2018-07-24 NOTE — Telephone Encounter (Signed)
Attempted to contact [place]. Went to [employer] which is not set up. Will continue efforts.

## 2018-07-26 NOTE — Telephone Encounter (Signed)
Still unable to reach [place].

## 2018-07-29 NOTE — Telephone Encounter (Signed)
Still unable to reach [place]. I have contacted patient He is unaware of this an not sure about this. I will speak with Dr. Agustin Cree to see if he knows any further.

## 2018-07-30 NOTE — Telephone Encounter (Signed)
Spoke with Colette at [facility]. She will message emily and have her contact me.

## 2018-07-31 ENCOUNTER — Telehealth: Payer: Self-pay | Admitting: Emergency Medicine

## 2018-07-31 NOTE — Telephone Encounter (Signed)
At this time Dr. Agustin Cree advises we wait to start him on coumadin due to the pandemic. Will address once this resolves.

## 2018-07-31 NOTE — Telephone Encounter (Signed)
error 

## 2018-08-02 ENCOUNTER — Other Ambulatory Visit: Payer: Self-pay | Admitting: Family Medicine

## 2018-08-02 MED ORDER — POTASSIUM CHLORIDE ER 10 MEQ PO TBCR: 10.0000 meq | EXTENDED_RELEASE_TABLET | Freq: Every day | ORAL | 2 refills | Status: DC

## 2018-08-02 NOTE — Telephone Encounter (Signed)
Ok to fill--was not previously filled by PCP

## 2018-08-13 ENCOUNTER — Telehealth: Payer: Self-pay | Admitting: Pulmonary Disease

## 2018-08-13 DIAGNOSIS — J9611 Chronic respiratory failure with hypoxia: Secondary | ICD-10-CM

## 2018-08-13 NOTE — Telephone Encounter (Signed)
ONO with RA >> test time 8 hrs 29 min.  Baseline SpO2 95%, low SpO2 86%.  Spent 56 sec with SpO2 < 88%.  Please confirm with his DME when the test was done.  Report lists test date from 05/08/1998.  If test was done recently, then please inform patient that he does not need to use supplemental oxygen at night.

## 2018-08-15 NOTE — Telephone Encounter (Signed)
Called and spoke with [facility] at [phone] Ext [phone] regarding VS concerns and questions below. The pt picked up on 07/23/2018 for ONO testing to be completed. Drue Dun will email Zandra Abts in ONO dept with [facility]. Awaiting a call back from [facility] in regards to VS comments below.  ONO with RA >> test time 8 hrs 29 min.  Baseline SpO2 95%, low SpO2 86%.  Spent 56 sec with SpO2 < 88%.  Please confirm with his DME when the test was done.  Report lists test date from 05/08/1998.  If test was done recently, then please inform patient that he does not need to use supplemental oxygen at night.

## 2018-08-20 NOTE — Telephone Encounter (Signed)
LVM for Zandra Abts with [facility] at [phone] Ext [phone] regarding below concerns from VS. X1

## 2018-08-22 ENCOUNTER — Telehealth: Payer: Self-pay | Admitting: Emergency Medicine

## 2018-08-22 NOTE — Telephone Encounter (Signed)
Patient's daughter called in stating that her father has reported having chest pain today.She is unsure of the details. She asked me to call her father. I called him he reports intermittent left chest pain that began today and went to his left arm. He believes it could be related to his hydralazine. Currently on the phone patient denies any symptoms. Dr. Agustin Cree made aware and advised patient have a televisit with [place] tomorrow, I informed patient of this he agreed, I went on the read consent and then he denied and said he would wait until all this is over and he can go to the office to be seen. I informed patient we can see in office if needed after telephone visit, he still denies. Patient informed to go to the emergency department if chest pain returns. I called the daughter [name] back and informed her of all this. She reports she will go check on her dad and convince him to do a televisit. They will call back if he wishes to be seen. I also advised her that he is to go to the emergency department if chest pain returns. She verbally understood.     YOUR CARDIOLOGY TEAM HAS ARRANGED FOR AN E-VISIT FOR YOUR APPOINTMENT - PLEASE REVIEW IMPORTANT INFORMATION BELOW SEVERAL DAYS PRIOR TO YOUR APPOINTMENT  Due to the recent COVID-19 pandemic, we are transitioning in-person office visits to tele-medicine visits in an effort to decrease unnecessary exposure to our patients, their families, and staff. Medicare and most insurances are covering these visits without a copay needed. We also encourage you to sign up for MyChart if you have not already done so. You will need a smartphone if possible. For patients that do not have this, we can still complete the visit using a regular telephone but do prefer a smartphone to enable video when possible. You may have a family member that lives with you that can help. If possible, we also ask that you have a blood pressure cuff and scale at home to measure your blood  pressure, heart rate and weight prior to your scheduled appointment. Patients with clinical needs that need an in-person evaluation and testing will still be able to come to the office if absolutely necessary. If you have any questions, feel free to call our office.    2-3 DAYS BEFORE YOUR APPOINTMENT  You will receive a telephone call from one of our [place] team members - your caller ID may say "Unknown caller." If this is a video visit, we will walk you through how to set up your device to be able to complete the visit. We will remind you check your blood pressure, heart rate and weight prior to your scheduled appointment. If you have an Apple Watch or Kardia, please upload any pertinent ECG strips the day before or morning of your appointment to [place]. Our staff will also make sure you have reviewed the consent and agree to move forward with your scheduled tele-health visit.     THE DAY OF YOUR APPOINTMENT  Approximately 15 minutes prior to your scheduled appointment, you will receive a telephone call from one of [place] team - your caller ID may say "Unknown caller."  Our staff will confirm medications, vital signs for the day and any symptoms you may be experiencing. Please have this information available prior to the time of visit start. It may also be helpful for you to have a pad of paper and pen handy for any instructions given during your visit.  They will also walk you through joining the smartphone meeting if this is a video visit.    CONSENT FOR TELE-HEALTH VISIT - PLEASE REVIEW  I hereby voluntarily request, consent and authorize [facility] and its employed or contracted physicians, physician assistants, nurse practitioners or other licensed health care professionals (the Practitioner), to provide me with telemedicine health care services (the "Services") as deemed necessary by the treating Practitioner. I acknowledge and consent to receive the Services by the Practitioner  via telemedicine. I understand that the telemedicine visit will involve communicating with the Practitioner through live audiovisual communication technology and the disclosure of certain medical information by electronic transmission. I acknowledge that I have been given the opportunity to request an in-person assessment or other available alternative prior to the telemedicine visit and am voluntarily participating in the telemedicine visit.  I understand that I have the right to withhold or withdraw my consent to the use of telemedicine in the course of my care at any time, without affecting my right to future care or treatment, and that the Practitioner or I may terminate the telemedicine visit at any time. I understand that I have the right to inspect all information obtained and/or recorded in the course of the telemedicine visit and may receive copies of available information for a reasonable fee.  I understand that some of the potential risks of receiving the Services via telemedicine include:  Marland Kitchen Delay or interruption in medical evaluation due to technological equipment failure or disruption; . Information transmitted may not be sufficient (e.g. poor resolution of images) to allow for appropriate medical decision making by the Practitioner; and/or  . In rare instances, security protocols could fail, causing a breach of personal health information.  Furthermore, I acknowledge that it is my responsibility to provide information about my medical history, conditions and care that is complete and accurate to the best of my ability. I acknowledge that Practitioner's advice, recommendations, and/or decision may be based on factors not within their control, such as incomplete or inaccurate data provided by me or distortions of diagnostic images or specimens that may result from electronic transmissions. I understand that the practice of medicine is not an exact science and that Practitioner makes no warranties  or guarantees regarding treatment outcomes. I acknowledge that I will receive a copy of this consent concurrently upon execution via email to the email address I last provided but may also request a printed copy by calling the office of [place].    I understand that my insurance will be billed for this visit.   I have read or had this consent read to me. . I understand the contents of this consent, which adequately explains the benefits and risks of the Services being provided via telemedicine.  . I have been provided ample opportunity to ask questions regarding this consent and the Services and have had my questions answered to my satisfaction. . I give my informed consent for the services to be provided through the use of telemedicine in my medical care  By participating in this telemedicine visit I agree to the above.

## 2018-08-23 NOTE — Telephone Encounter (Signed)
Spoke with Jeneen Rinks at [facility]  He states correct date for ONO was 07/23/2018  Spoke with the pt and notified per VS no need for o2 with sleep  Pt verbalized understanding  Order sent to d/c o2

## 2018-09-02 ENCOUNTER — Other Ambulatory Visit: Payer: Self-pay | Admitting: Cardiology

## 2018-09-02 ENCOUNTER — Other Ambulatory Visit: Payer: Self-pay | Admitting: Family Medicine

## 2018-09-04 ENCOUNTER — Telehealth: Payer: Self-pay | Admitting: Emergency Medicine

## 2018-09-04 NOTE — Telephone Encounter (Signed)
stop hydralazine

## 2018-09-04 NOTE — Telephone Encounter (Signed)
Patient's daughter called reporting the patient has had increased dizziness since starting hydralazine 10 mg three times daily. Patient is requesting in office visit, we previously set up a televisit with him went through the consent and he denied. I will consult with Dr. Agustin Cree about bringing him in the office.

## 2018-09-04 NOTE — Telephone Encounter (Signed)
Informed daughter to have patient stop hydralazine and call back if his symptoms do not get better. Patient's daugter verbally understands.

## 2018-09-09 ENCOUNTER — Telehealth: Payer: Self-pay | Admitting: *Deleted

## 2018-09-09 NOTE — Telephone Encounter (Signed)
Received request for Medical Records from [facility], partner of [facility]; forwarded to HIM Department/Medical Records via fax/SLS 05/04

## 2018-09-20 DIAGNOSIS — J9621 Acute and chronic respiratory failure with hypoxia: Secondary | ICD-10-CM | POA: Diagnosis not present

## 2018-09-20 DIAGNOSIS — R062 Wheezing: Secondary | ICD-10-CM | POA: Diagnosis not present

## 2018-09-20 DIAGNOSIS — I5043 Acute on chronic combined systolic (congestive) and diastolic (congestive) heart failure: Secondary | ICD-10-CM | POA: Diagnosis not present

## 2018-09-20 DIAGNOSIS — J449 Chronic obstructive pulmonary disease, unspecified: Secondary | ICD-10-CM | POA: Diagnosis not present

## 2018-10-14 ENCOUNTER — Telehealth: Payer: Self-pay | Admitting: Cardiology

## 2018-10-14 NOTE — Telephone Encounter (Signed)
Virtual Visit Pre-Appointment Phone Call  "(Name), I am calling you today to discuss your upcoming appointment. We are currently trying to limit exposure to the virus that causes COVID-19 by seeing patients at home rather than in the office."  1. "What is the BEST phone number to call the day of the visit?" - include this in appointment notes  2. Do you have or have access to (through a family member/friend) a smartphone with video capability that we can use for your visit?" a. If yes - list this number in appt notes as cell (if different from BEST phone #) and list the appointment type as a VIDEO visit in appointment notes b. If no - list the appointment type as a PHONE visit in appointment notes  3. Confirm consent - "In the setting of the current Covid19 crisis, you are scheduled for a (phone or video) visit with your provider on (date) at (time).  Just as we do with many in-office visits, in order for you to participate in this visit, we must obtain consent.  If you'd like, I can send this to your mychart (if signed up) or email for you to review.  Otherwise, I can obtain your verbal consent now.  All virtual visits are billed to your insurance company just like a normal visit would be.  By agreeing to a virtual visit, we'd like you to understand that the technology does not allow for your provider to perform an examination, and thus may limit your provider's ability to fully assess your condition. If your provider identifies any concerns that need to be evaluated in person, we will make arrangements to do so.  Finally, though the technology is pretty good, we cannot assure that it will always work on either your or our end, and in the setting of a video visit, we may have to convert it to a phone-only visit.  In either situation, we cannot ensure that we have a secure connection.  Are you willing to proceed?" STAFF: Did the patient verbally acknowledge consent to telehealth visit? Document  YES/NO here: Yes per daughter  55. Advise patient to be prepared - "Two hours prior to your appointment, go ahead and check your blood pressure, pulse, oxygen saturation, and your weight (if you have the equipment to check those) and write them all down. When your visit starts, your provider will ask you for this information. If you have an Apple Watch or Kardia device, please plan to have heart rate information ready on the day of your appointment. Please have a pen and paper handy nearby the day of the visit as well."  5. Give patient instructions for MyChart download to smartphone OR Doximity/Doxy.me as below if video visit (depending on what platform provider is using)  6. Inform patient they will receive a phone call 15 minutes prior to their appointment time (may be from unknown caller ID) so they should be prepared to answer    TELEPHONE CALL NOTE  PRENTIS LANGDON has been deemed a candidate for a follow-up tele-health visit to limit community exposure during the Covid-19 pandemic. I spoke with the patient via phone to ensure availability of phone/video source, confirm preferred email & phone number, and discuss instructions and expectations.  I reminded Eric Herring to be prepared with any vital sign and/or heart rhythm information that could potentially be obtained via home monitoring, at the time of his visit. I reminded Eric Herring to expect a phone call  prior to his visit.  Calla Kicks 10/14/2018 1:18 PM   INSTRUCTIONS FOR DOWNLOADING THE MYCHART APP TO SMARTPHONE  - The patient must first make sure to have activated MyChart and know their login information - If Apple, go to [employer] and type in MyChart in the search bar and download the app. If Android, ask patient to go to [employer] and type in [place] in the search bar and download the app. The app is free but as with any other app downloads, their phone may require them to verify saved payment information or  Apple/Android password.  - The patient will need to then log into the app with their MyChart username and password, and select [facility] as their healthcare provider to link the account. When it is time for your visit, go to the MyChart app, find appointments, and click Begin Video Visit. Be sure to Select Allow for your device to access the Microphone and Camera for your visit. You will then be connected, and your provider will be with you shortly.  **If they have any issues connecting, or need assistance please contact MyChart service desk [phone])**  **If using a computer, in order to ensure the best quality for their visit they will need to use either of the following Internet Browsers: [employer], or Google Chrome**  IF USING DOXIMITY or DOXY.ME - The patient will receive a link just prior to their visit by text.     FULL LENGTH CONSENT FOR TELE-HEALTH VISIT   I hereby voluntarily request, consent and authorize [facility] and its employed or contracted physicians, physician assistants, nurse practitioners or other licensed health care professionals (the Practitioner), to provide me with telemedicine health care services (the Services") as deemed necessary by the treating Practitioner. I acknowledge and consent to receive the Services by the Practitioner via telemedicine. I understand that the telemedicine visit will involve communicating with the Practitioner through live audiovisual communication technology and the disclosure of certain medical information by electronic transmission. I acknowledge that I have been given the opportunity to request an in-person assessment or other available alternative prior to the telemedicine visit and am voluntarily participating in the telemedicine visit.  I understand that I have the right to withhold or withdraw my consent to the use of telemedicine in the course of my care at any time, without affecting my right to future care  or treatment, and that the Practitioner or I may terminate the telemedicine visit at any time. I understand that I have the right to inspect all information obtained and/or recorded in the course of the telemedicine visit and may receive copies of available information for a reasonable fee.  I understand that some of the potential risks of receiving the Services via telemedicine include:   Delay or interruption in medical evaluation due to technological equipment failure or disruption;  Information transmitted may not be sufficient (e.g. poor resolution of images) to allow for appropriate medical decision making by the Practitioner; and/or   In rare instances, security protocols could fail, causing a breach of personal health information.  Furthermore, I acknowledge that it is my responsibility to provide information about my medical history, conditions and care that is complete and accurate to the best of my ability. I acknowledge that Practitioner's advice, recommendations, and/or decision may be based on factors not within their control, such as incomplete or inaccurate data provided by me or distortions of diagnostic images or specimens that may result from electronic  transmissions. I understand that the practice of medicine is not an exact science and that Practitioner makes no warranties or guarantees regarding treatment outcomes. I acknowledge that I will receive a copy of this consent concurrently upon execution via email to the email address I last provided but may also request a printed copy by calling the office of [facility].    I understand that my insurance will be billed for this visit.   I have read or had this consent read to me.  I understand the contents of this consent, which adequately explains the benefits and risks of the Services being provided via telemedicine.   I have been provided ample opportunity to ask questions regarding this consent and the Services and have had  my questions answered to my satisfaction.  I give my informed consent for the services to be provided through the use of telemedicine in my medical care  By participating in this telemedicine visit I agree to the above.

## 2018-10-17 DIAGNOSIS — E1122 Type 2 diabetes mellitus with diabetic chronic kidney disease: Secondary | ICD-10-CM | POA: Diagnosis not present

## 2018-10-17 DIAGNOSIS — Z8709 Personal history of other diseases of the respiratory system: Secondary | ICD-10-CM | POA: Diagnosis not present

## 2018-10-17 DIAGNOSIS — N184 Chronic kidney disease, stage 4 (severe): Secondary | ICD-10-CM | POA: Diagnosis not present

## 2018-10-17 DIAGNOSIS — Z8546 Personal history of malignant neoplasm of prostate: Secondary | ICD-10-CM | POA: Diagnosis not present

## 2018-10-21 DIAGNOSIS — J9621 Acute and chronic respiratory failure with hypoxia: Secondary | ICD-10-CM | POA: Diagnosis not present

## 2018-10-21 DIAGNOSIS — R062 Wheezing: Secondary | ICD-10-CM | POA: Diagnosis not present

## 2018-10-21 DIAGNOSIS — J449 Chronic obstructive pulmonary disease, unspecified: Secondary | ICD-10-CM | POA: Diagnosis not present

## 2018-10-21 DIAGNOSIS — I5043 Acute on chronic combined systolic (congestive) and diastolic (congestive) heart failure: Secondary | ICD-10-CM | POA: Diagnosis not present

## 2018-10-30 ENCOUNTER — Encounter: Payer: Self-pay | Admitting: Cardiology

## 2018-10-30 ENCOUNTER — Telehealth (INDEPENDENT_AMBULATORY_CARE_PROVIDER_SITE_OTHER): Payer: Medicare HMO | Admitting: Cardiology

## 2018-10-30 ENCOUNTER — Other Ambulatory Visit: Payer: Self-pay

## 2018-10-30 DIAGNOSIS — I420 Dilated cardiomyopathy: Secondary | ICD-10-CM | POA: Diagnosis not present

## 2018-10-30 DIAGNOSIS — I480 Paroxysmal atrial fibrillation: Secondary | ICD-10-CM

## 2018-10-30 DIAGNOSIS — I340 Nonrheumatic mitral (valve) insufficiency: Secondary | ICD-10-CM

## 2018-10-30 DIAGNOSIS — I272 Pulmonary hypertension, unspecified: Secondary | ICD-10-CM

## 2018-10-30 NOTE — Progress Notes (Signed)
Virtual Visit via Telephone Note   This visit type was conducted due to national recommendations for restrictions regarding the COVID-19 Pandemic (e.g. social distancing) in an effort to limit this patient's exposure and mitigate transmission in our community.  Due to his co-morbid illnesses, this patient is at least at moderate risk for complications without adequate follow up.  This format is felt to be most appropriate for this patient at this time.  The patient did not have access to video technology/had technical difficulties with video requiring transitioning to audio format only (telephone).  All issues noted in this document were discussed and addressed.  No physical exam could be performed with this format.  Please refer to the patient's chart for his  consent to telehealth for [facility].  Evaluation Performed:  Follow-up visit  This visit type was conducted due to national recommendations for restrictions regarding the COVID-19 Pandemic (e.g. social distancing).  This format is felt to be most appropriate for this patient at this time.  All issues noted in this document were discussed and addressed.  No physical exam was performed (except for noted visual exam findings with Video Visits).  Please refer to the patient's chart (MyChart message for video visits and phone note for telephone visits) for the patient's consent to telehealth for [facility].  Date:  10/30/2018  ID: [name], DOB 1935-03-19, MRN [id number]   Patient Location: [address] [place] [address]   Provider location:   [facility] [facility]  PCP:  Shelda Pal, DO  Cardiologist:  Jenne Campus, MD     Chief Complaint: Doing well  History of Present Illness:    Eric Herring is a 84 y.o. male  who presents via audio/video conferencing for a telehealth visit today.  With cardiomyopathy ejection fraction 30 to 35%, refused ICD.  Recently he lost his wife and he is  still grieving about that.  He is living alone and is trying to take care of his household by himself.  Denies have any chest pain tightness squeezing pressure been chest no swelling of lower extremities no dizziness.   The patient does not have symptoms concerning for COVID-19 infection (fever, chills, cough, or new SHORTNESS OF BREATH).    Prior CV studies:   The following studies were reviewed today:       Past Medical History:  Diagnosis Date  . Allergy   . Aortic atherosclerosis ([place])   . Arthropathy   . CAD (coronary artery disease)   . Cardiomyopathy ([place])   . Chronic kidney disease   . Colon polyp   . COPD (chronic obstructive pulmonary disease) ([place])   . Diabetes ([place])   . Diabetic neuropathy ([place])   . Heart murmur   . Hypercholesterolemia   . Hypertension    pulmonary  . LVH (left ventricular hypertrophy)   . Lymphoma ([place])   . Mitral regurgitation   . Prostate cancer ([place])   . Pulmonary hypertension ([place])   . Tricuspid regurgitation     Past Surgical History:  Procedure Laterality Date  . HEMORROIDECTOMY     pt does not remember year     Current Meds  Medication Sig  . albuterol (PROVENTIL HFA;VENTOLIN HFA) 108 (90 Base) MCG/ACT inhaler Inhale 2 puffs into the lungs every 6 (six) hours as needed for wheezing or shortness of breath.  Marland Kitchen albuterol (PROVENTIL) (2.5 MG/3ML) 0.083% nebulizer solution Take 3 mLs (2.5 mg total) by nebulization every 6 (six)  hours as needed for wheezing or shortness of breath (3 month supply).  Marland Kitchen amiodarone (PACERONE) 200 MG tablet TAKE 1 TABLET EVERY DAY  . aspirin 81 MG tablet Take 81 mg by mouth daily.  . Calcium Carb-Cholecalciferol (CALCIUM-VITAMIN D) 500-200 MG-UNIT tablet Take 1 tablet by mouth daily.   . fexofenadine (ALLEGRA ALLERGY) 180 MG tablet Take 1 tablet (180 mg total) by mouth daily.  . Fluticasone-Umeclidin-Vilant (TRELEGY ELLIPTA) 100-62.5-25 MCG/INH AEPB Inhale 1 puff into the lungs daily.  . furosemide  (LASIX) 40 MG tablet Take 1 tablet (40 mg total) by mouth daily.  . hydrALAZINE (APRESOLINE) 10 MG tablet Take 1 tablet (10 mg total) by mouth 3 (three) times daily.  . isosorbide mononitrate (IMDUR) 30 MG 24 hr tablet TAKE 1 TABLET EVERY DAY  . Multiple Vitamin (MULTIVITAMIN) tablet Take 1 tablet by mouth.   . potassium chloride (K-DUR) 10 MEQ tablet Take 1 tablet (10 mEq total) by mouth daily.  . pravastatin (PRAVACHOL) 20 MG tablet Take 1 tablet (20 mg total) by mouth daily.      Family History: The patient's family history includes COPD in his father.   ROS:   Please see the history of present illness.     All other systems reviewed and are negative.   Labs/Other Tests and Data Reviewed:     Recent Labs: 12/10/2017: B Natriuretic Peptide 1,224.9 01/28/2018: ALT 19 04/01/2018: Hemoglobin 12.1; NT-Pro BNP 4,656; Platelets 188 05/16/2018: BUN 24; Creatinine, Ser 2.81; Potassium 4.4; Sodium 137  Recent Lipid Panel No results found for: CHOL, TRIG, HDL, CHOLHDL, VLDL, LDLCALC, LDLDIRECT    Exam:    Vital Signs:  There were no vitals taken for this visit.    Wt Readings from Last 3 Encounters:  07/17/18 160 lb (72.6 kg)  07/10/18 160 lb (72.6 kg)  06/20/18 168 lb (76.2 kg)     Well nourished, well developed in no acute distress. Alert awake and at the time 3 happy to be able to talk to me over the phone not in any distress unable to establish video link.  Asymptomatic at the time of my interview he is at home and I am in the office in October  Diagnosis for this visit:   1. Dilated cardiomyopathy ([place])   2. Pulmonary hypertension, unspecified ([place])   3. Nonrheumatic mitral valve regurgitation   4. Paroxysmal atrial fibrillation ([facility])      ASSESSMENT & PLAN:    1.  Dilated cardiomyopathy I wanted to increase the dose of medication however he does not want to he said last time we try to do something like that he was very dizzy and was not able to function therefore we  will continue present management 2.  Pulmonary hypertension.  Noted unchanged overall clinically he is doing well 3.  Nonrheumatic mitral valve regurgitation.  Noted 4.  Paroxysmal atrial fibrillation on amiodarone denies having any palpitations, not anticoagulated because of fragile state  COVID-19 Education: The signs and symptoms of COVID-19 were discussed with the patient and how to seek care for testing (follow up with PCP or arrange E-visit).  The importance of social distancing was discussed today.  Patient Risk:   After full review of this patients clinical status, I feel that they are at least moderate risk at this time.  Time:   Today, I have spent 15 minutes with the patient with telehealth technology discussing pt health issues.  I spent 73minutes reviewing her chart before the visit.  Visit was finished  at 11:41 AM.    Medication Adjustments/Labs and Tests Ordered: Current medicines are reviewed at length with the patient today.  Concerns regarding medicines are outlined above.  No orders of the defined types were placed in this encounter.  Medication changes: No orders of the defined types were placed in this encounter.    Disposition: Follow-up in 5 months  Signed, Park Liter, MD, [facility] 10/30/2018 11:41 AM    [place]

## 2018-10-30 NOTE — Patient Instructions (Signed)
Medication Instructions:  Your physician recommends that you continue on your current medications as directed. Please refer to the Current Medication list given to you today.  If you need a refill on your cardiac medications before your next appointment, please call your pharmacy.   Lab work: None.  If you have labs (blood work) drawn today and your tests are completely normal, you will receive your results only by: Marland Kitchen MyChart Message (if you have MyChart) OR . A paper copy in the mail If you have any lab test that is abnormal or we need to change your treatment, we will call you to review the results.  Testing/Procedures: None.   Follow-Up: At [facility], you and your health needs are our priority.  As part of our continuing mission to provide you with exceptional heart care, we have created designated Provider Care Teams.  These Care Teams include your primary Cardiologist (physician) and Advanced Practice Providers (APPs -  Physician Assistants and Nurse Practitioners) who all work together to provide you with the care you need, when you need it. You will need a follow up appointment in 4 months.   Any Other Special Instructions Will Be Listed Below (If Applicable).

## 2018-11-15 IMAGING — DX DG CHEST 2V
2 series · 2 of 2 positions shown · non-contrast
Comparison: 12/10/2017, CT chest 12/10/2017

CLINICAL DATA: Follow-up pneumonia

EXAM:
CHEST - 2 VIEW

[chest pa]
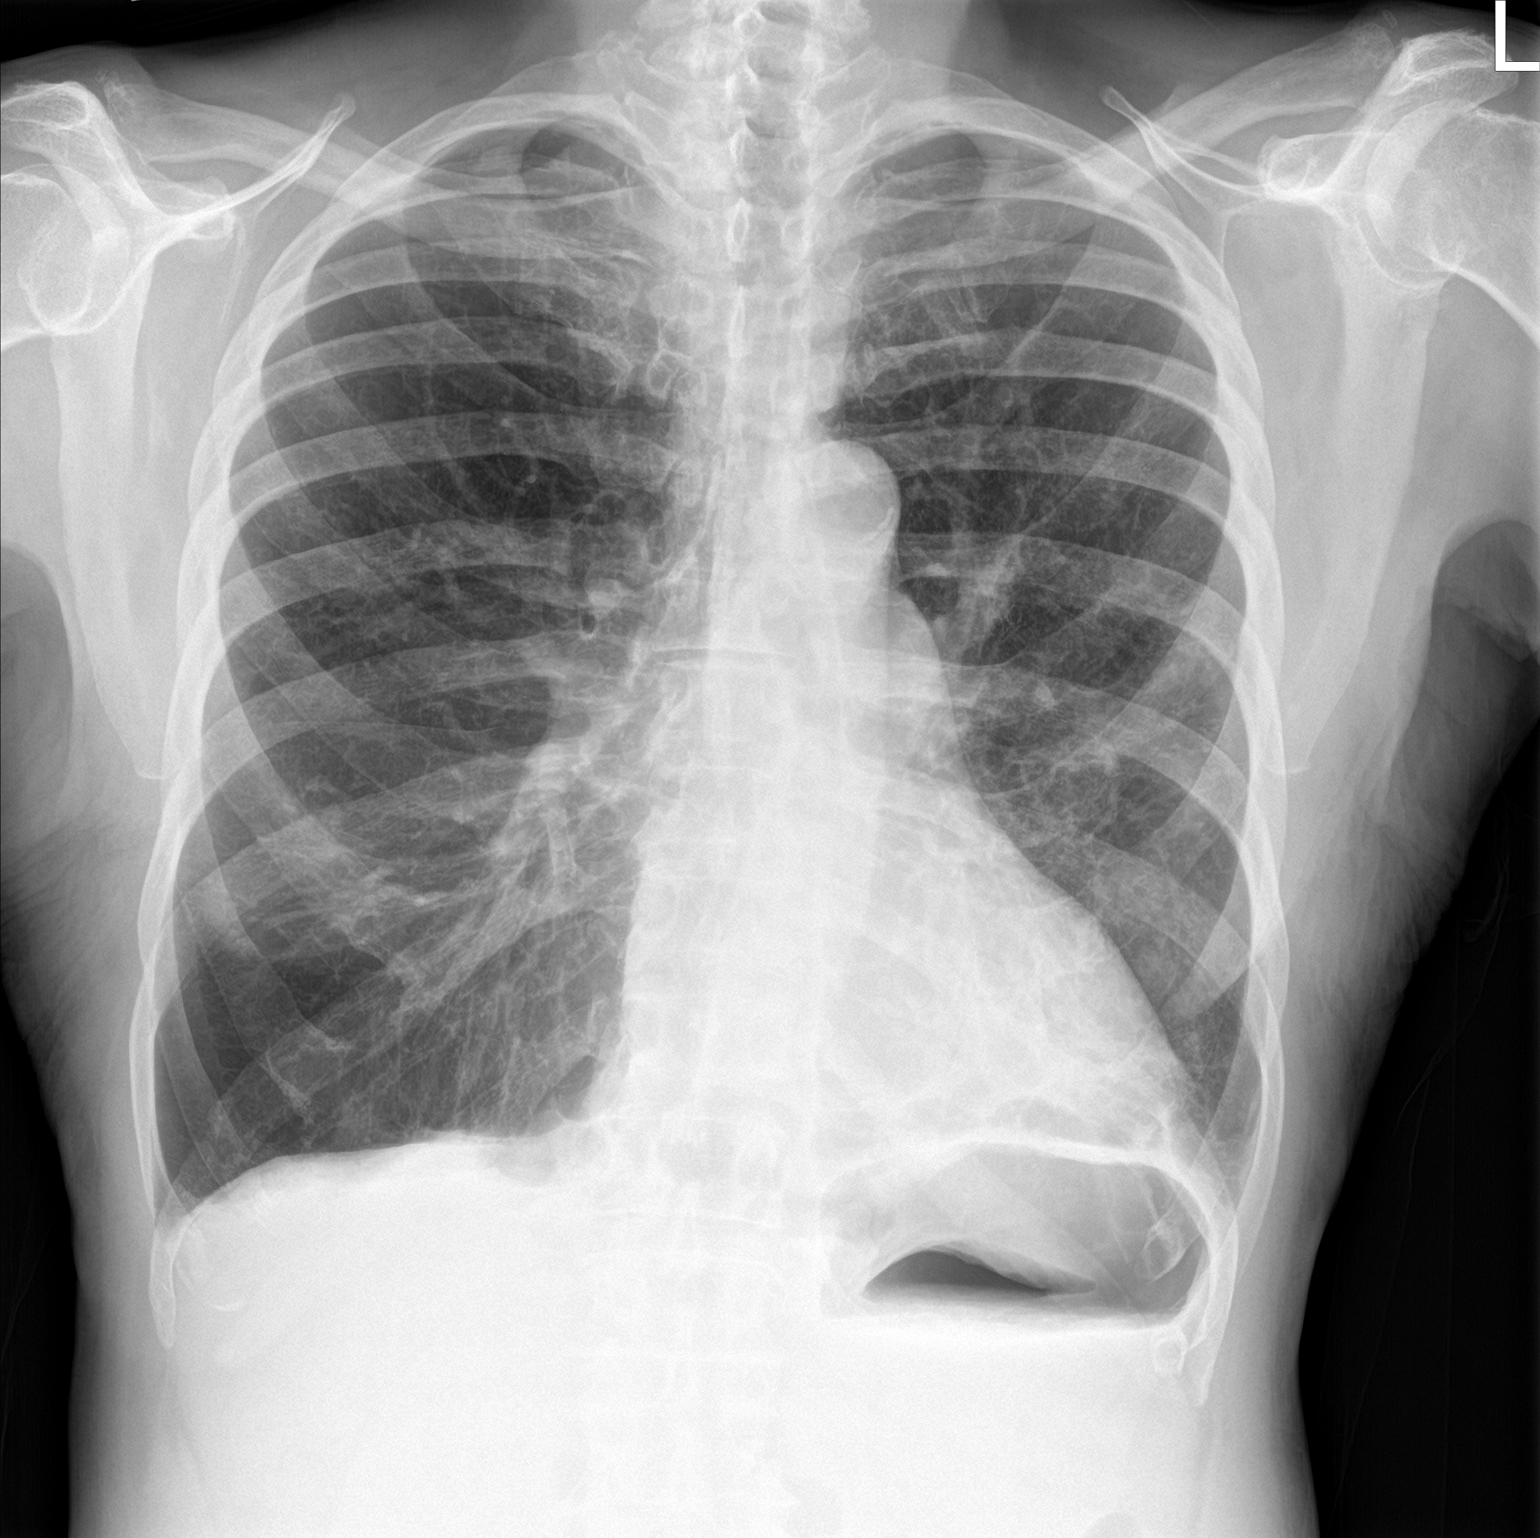

[chest lat]
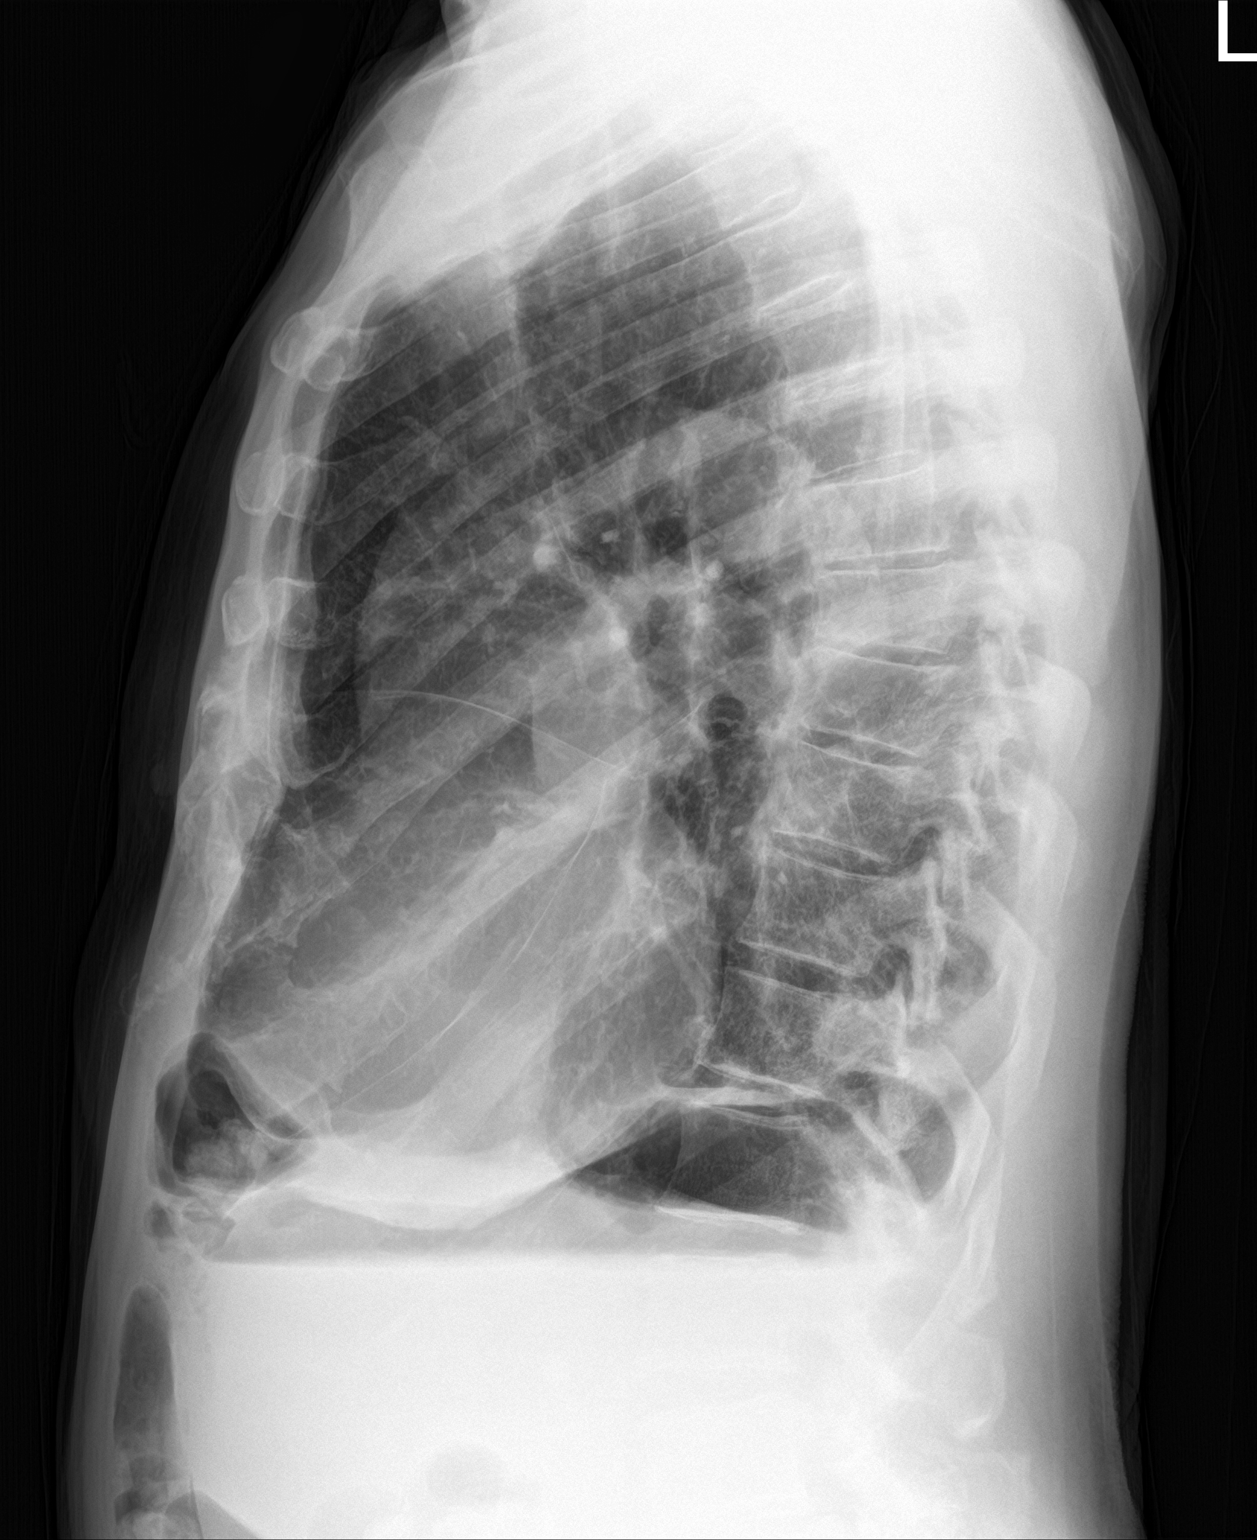

[2 of 2 positions shown; findings below may reference images not displayed]

FINDINGS: Hyperinflation with emphysematous disease. Persistent bronchiectasis
and airspace disease in the left lower lobe. No pleural effusion.
Stable cardiomediastinal silhouette with aortic atherosclerosis. No
pneumothorax.
IMPRESSION: 1. Persistent airspace opacity at the left lung base may reflect
residual pneumonia, suggest additional short interval radiographic
follow-up
2. Hyperinflation with emphysematous disease.

## 2018-11-20 DIAGNOSIS — R062 Wheezing: Secondary | ICD-10-CM | POA: Diagnosis not present

## 2018-11-20 DIAGNOSIS — J9621 Acute and chronic respiratory failure with hypoxia: Secondary | ICD-10-CM | POA: Diagnosis not present

## 2018-11-20 DIAGNOSIS — J449 Chronic obstructive pulmonary disease, unspecified: Secondary | ICD-10-CM | POA: Diagnosis not present

## 2018-11-20 DIAGNOSIS — I5043 Acute on chronic combined systolic (congestive) and diastolic (congestive) heart failure: Secondary | ICD-10-CM | POA: Diagnosis not present

## 2018-11-28 ENCOUNTER — Other Ambulatory Visit: Payer: Self-pay | Admitting: Family Medicine

## 2018-11-28 ENCOUNTER — Other Ambulatory Visit: Payer: Self-pay | Admitting: Cardiology

## 2018-11-28 DIAGNOSIS — R600 Localized edema: Secondary | ICD-10-CM

## 2018-12-05 IMAGING — DX DG CHEST 2V
2 series · 2 of 2 positions shown · non-contrast
Comparison: CT from 12/10/2017, chest x-ray from 01/10/2018

CLINICAL DATA: Follow-up pneumonia

EXAM:
CHEST - 2 VIEW

[chest pa]
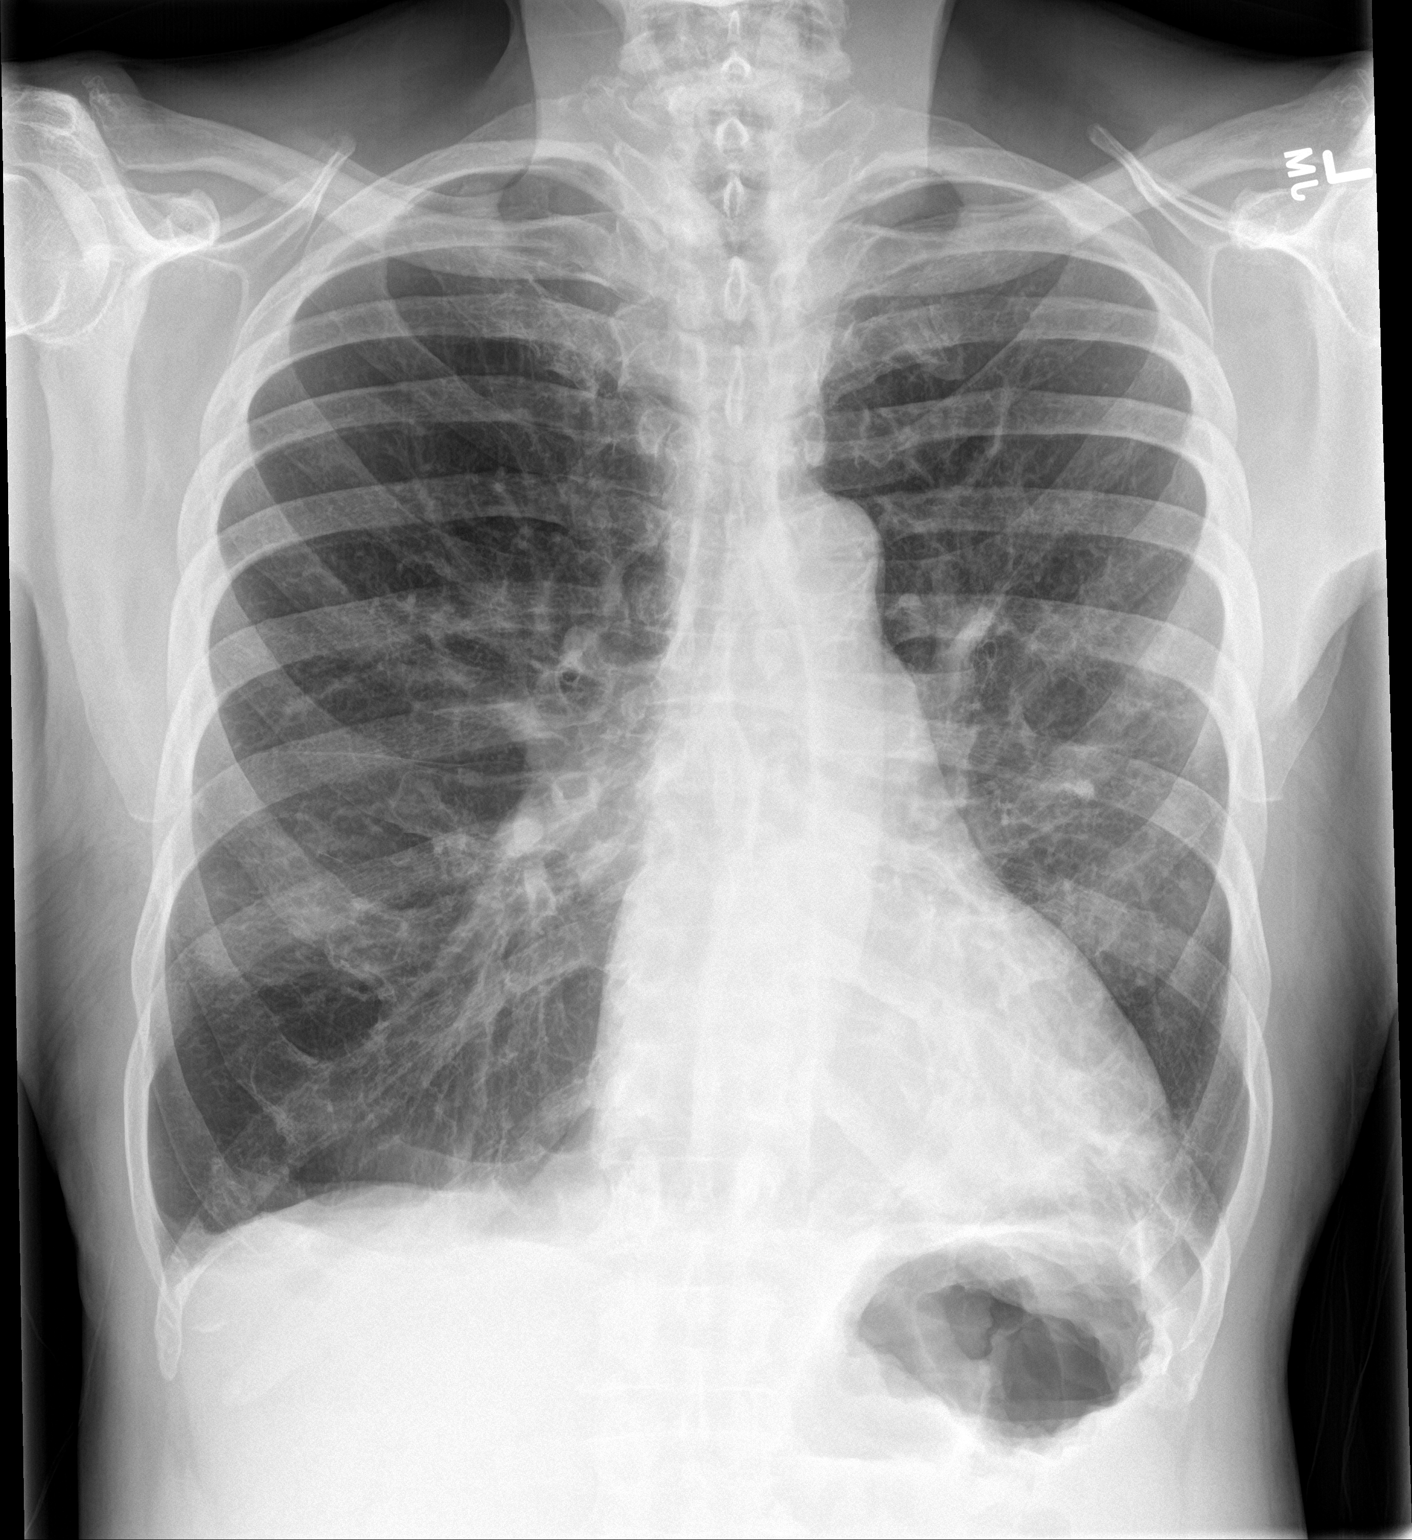

[chest lat]
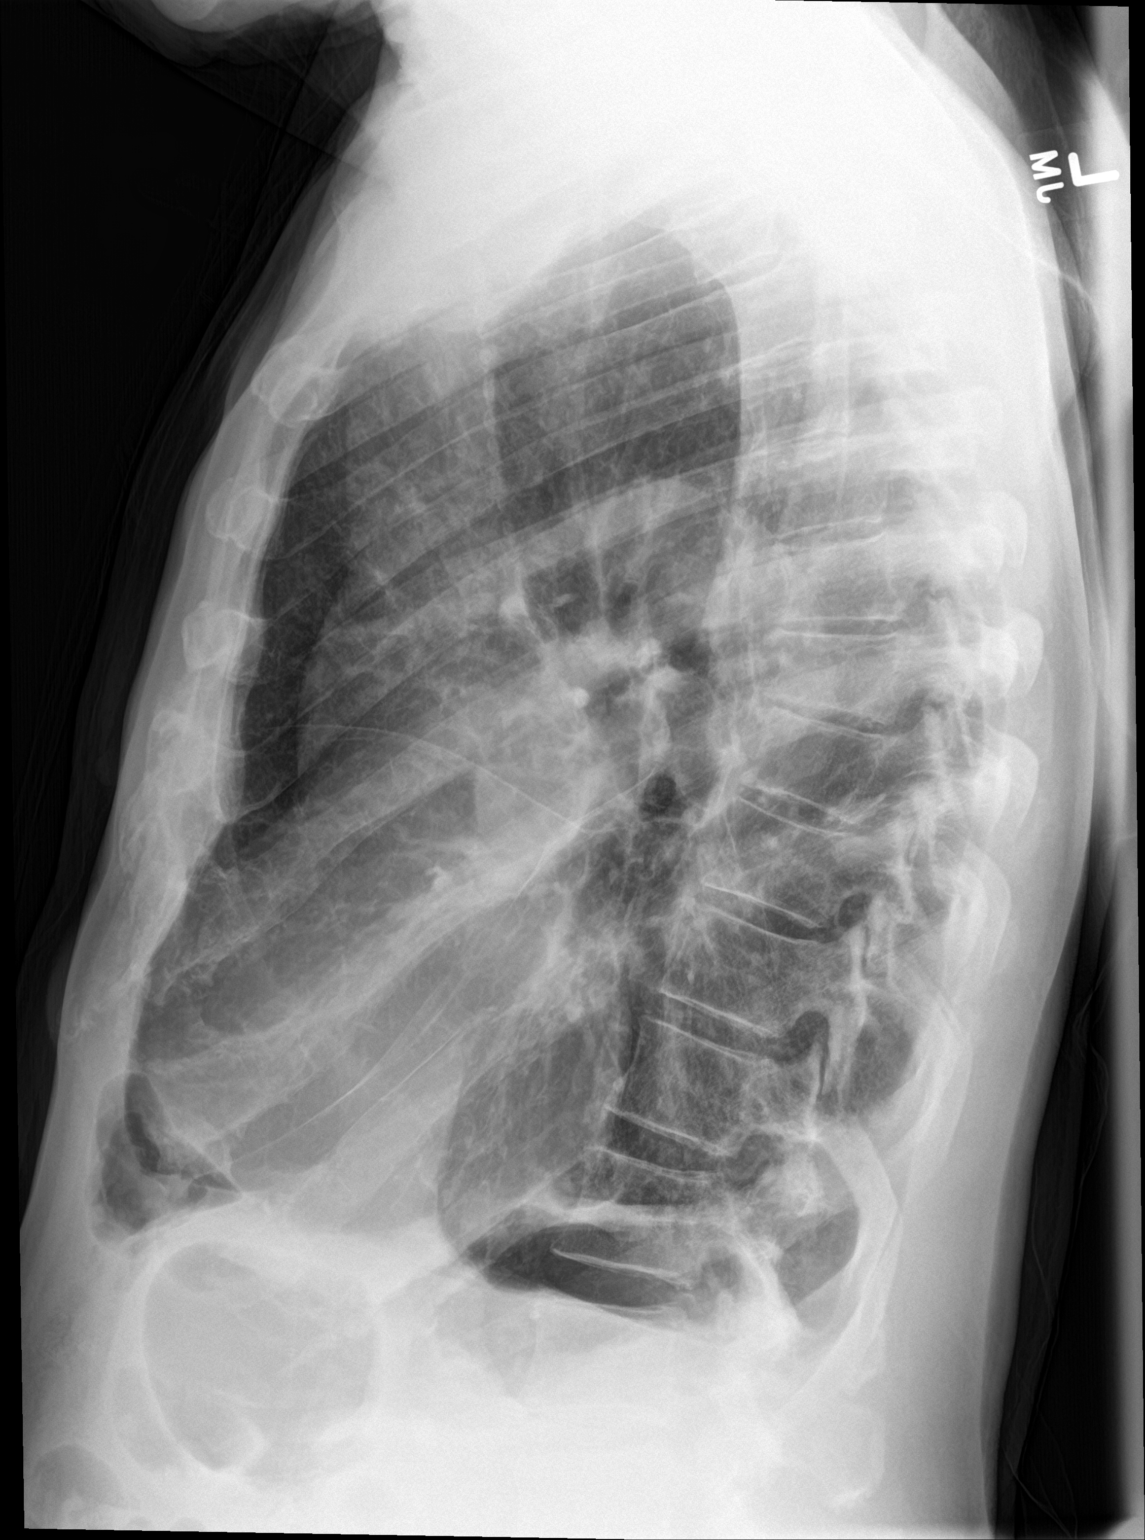

[2 of 2 positions shown; findings below may reference images not displayed]

FINDINGS: Cardiac shadow is within normal limits. The lungs are again
hyperinflated. Persistent density is again noted at the left base
stable from a prior CT on 12/10/2017. The chronicity would suggest a
more chronic inflammatory process. Similar changes are noted within
the left upper lobe laterally. No new focal infiltrate is seen. No
acute bony abnormality is noted.
IMPRESSION: Chronic changes likely related to scarring particularly in the left
mid and lower lung. No new focal abnormality is seen.

## 2018-12-08 ENCOUNTER — Other Ambulatory Visit: Payer: Self-pay | Admitting: Family Medicine

## 2018-12-21 DIAGNOSIS — J9621 Acute and chronic respiratory failure with hypoxia: Secondary | ICD-10-CM | POA: Diagnosis not present

## 2018-12-21 DIAGNOSIS — J449 Chronic obstructive pulmonary disease, unspecified: Secondary | ICD-10-CM | POA: Diagnosis not present

## 2018-12-21 DIAGNOSIS — R062 Wheezing: Secondary | ICD-10-CM | POA: Diagnosis not present

## 2018-12-21 DIAGNOSIS — I5043 Acute on chronic combined systolic (congestive) and diastolic (congestive) heart failure: Secondary | ICD-10-CM | POA: Diagnosis not present

## 2019-01-02 DIAGNOSIS — M2041 Other hammer toe(s) (acquired), right foot: Secondary | ICD-10-CM | POA: Diagnosis not present

## 2019-01-02 DIAGNOSIS — M2042 Other hammer toe(s) (acquired), left foot: Secondary | ICD-10-CM | POA: Diagnosis not present

## 2019-01-02 DIAGNOSIS — M79672 Pain in left foot: Secondary | ICD-10-CM | POA: Diagnosis not present

## 2019-01-02 DIAGNOSIS — E119 Type 2 diabetes mellitus without complications: Secondary | ICD-10-CM | POA: Diagnosis not present

## 2019-01-02 DIAGNOSIS — L603 Nail dystrophy: Secondary | ICD-10-CM | POA: Diagnosis not present

## 2019-01-02 DIAGNOSIS — M79671 Pain in right foot: Secondary | ICD-10-CM | POA: Diagnosis not present

## 2019-01-02 DIAGNOSIS — L84 Corns and callosities: Secondary | ICD-10-CM | POA: Diagnosis not present

## 2019-01-07 NOTE — Progress Notes (Signed)
Office Visit    Patient Name: Eric Herring Date of Encounter: 01/09/2019  Primary Care Provider:  Shelda Pal, DO Primary Cardiologist:  Jenne Campus, MD  Chief Complaint    84 yo male with PMH dilated cardiomyopathy, chronic combined systolic and diastolic heart failure (EF 35-40% on 06/19/2018), pulmonary HTN, nonrheumatic mitral valve regurgitation, PAF on amiodarone not on anticoagulation, COPD, CKD now stage 4-5, and diffuse large b-cell lymphoma of lymph nodes of head. He presents today with chief complaint of lower extremity edema, dark skin on lower extremities, and dyspnea when outdoors.   Past Medical History    Past Medical History:  Diagnosis Date  . Allergy   . Aortic atherosclerosis ([place])   . Arthropathy   . CAD (coronary artery disease)   . Cardiomyopathy ([place])   . Chronic kidney disease   . Colon polyp   . COPD (chronic obstructive pulmonary disease) ([place])   . Diabetes ([place])   . Diabetic neuropathy ([place])   . Heart murmur   . Hypercholesterolemia   . Hypertension    pulmonary  . LVH (left ventricular hypertrophy)   . Lymphoma ([place])   . Mitral regurgitation   . Prostate cancer ([place])   . Pulmonary hypertension ([place])   . Tricuspid regurgitation    Past Surgical History:  Procedure Laterality Date  . HEMORROIDECTOMY     pt does not remember year    Allergies  Allergies  Allergen Reactions  . Lisinopril Anaphylaxis and Swelling         History of Present Illness    Eric Herring is a 84 y.o. male with a hx of dilated cardiomyopathy, chronic combined systolic and diastolic heart failure (EF 35-40% on 06/19/2018), pulmonary HTN, nonrheumatic mitral valve regurgitation, PAF on amiodarone not on anticoagulation, COPD, CKD now stage 4-5, and diffuse large b-cell lymphoma of lymph nodes of head. He was last seen by Dr. Agustin Cree 10/30/18 via video visit. Of note, he has declined ICD in the past.   Eric Herring presents today with his  daughter. He is following with oncology at [facility] for diffuse large b-cell lymphoma of lymph nodes of head. Anticipates upcoming radiation.  His chief complaints today are lower extremity swelling, dyspnea on exertion when outdoors, and darkened skin on his feet. Reports he is dyspneic only outside in the heat and this started "when it got hot out" around June. Reports this problem has not gotten better nor worse. Relieving factors include going indoors. He does not have dyspnea when walking around his home indoors. He was encouraged to avoid the hot part of the day.   Reports lower extremity edema to his knee some days. Reports it is better in the morning and worse throughout the day. He does wear compression stockings during the day. His daughters help prepare his food and do not add salt. He tells me he drinks three to five 16 oz bottles of water per day. He sits with his legs in a dependent position most of the day.   His daughter's primary concern is the discoloration of his lower extremities. Unclear date of onset. Reports problem is ongoing. They have not tried any relieving factors nor noted any aggravating factors. On exam his lower legs are dry and darker than remainder of his skin without signs of infection. Consistent exam findings with venous stasis which we discussed.  Tells me he saw his oncologist this morning Dr. Wendee Beavers and labs are available for review. BUN  45, creatinine 4.46, GFR 13. He has not seen a nephrologist in a number of years and does not recall who he used to see. For comparison, labs 05/16/2018 with creatinine 2.81, GFR 27.94, BUN 24. He recently refilled his Eliquis and was frustrated by the cost. Tells me he has not been taking it as he wasn't sure he needed it. Had planned on returning it, but was told he was beyond the window.   He denies chest pain, palpitations  EKGs/Labs/Other Studies Reviewed:   The following studies were reviewed today:   06/19/2018 Echo  1. The left ventricle has an ejection fraction of 35-40%. The cavity size was normal. There is severe asymmetric septal left ventricular hypertrophy.  2. The right ventricle has mildly reduced systolic function. The cavity was mildly enlarged. There is no increase in right ventricular wall thickness.  3. The tricuspid valve is normal in structure.  4. The pulmonic valve was normal in structure.  5. The ascending aorta, aortic root and aortic arch are normal in size and structure.  6. The aortic valve is tricuspid There is mild thickening of the aortic valve.  7. The mitral valve is normal in structure. Mitral valve regurgitation is moderate by quantitative and color flow Doppler.  US Venous Img Lower Bilateral 05/06/18 Sonographic survey of the bilateral lower extremities negative for DVT. Bilateral lower extremity edema   EKG:  EKG is ordered today.  The ekg ordered today demonstrates SR with 1st degree AV block, left axis deviation, RBBB, and stable t-wave inversion in lead aVR, V1, aVL, V2 compared to EKG 03/2018.   Recent Labs: 01/28/2018: ALT 19 04/01/2018: Hemoglobin 12.1; NT-Pro BNP 4,656; Platelets 188 05/16/2018: BUN 24; Creatinine, Ser 2.81; Potassium 4.4; Sodium 137  01/08/2019 via [facility]: creatinine 4.46, GFR 13, ALT 32, AST 38, K 5.2, Hb 12.5, Hct 36.5, wbc 1.9,  Recent Lipid Panel No results found for: CHOL, TRIG, HDL, CHOLHDL, VLDL, LDLCALC, LDLDIRECT  Home Medications   Current Meds  Medication Sig  . albuterol (PROVENTIL HFA;VENTOLIN HFA) 108 (90 Base) MCG/ACT inhaler Inhale 2 puffs into the lungs every 6 (six) hours as needed for wheezing or shortness of breath.  Marland Kitchen albuterol (PROVENTIL) (2.5 MG/3ML) 0.083% nebulizer solution USE 1 VIAL IN NEBULIZER EVERY 6 HOURS AS NEEDED FOR WHEEZING FOR SHORTNESS OF BREATH  . amiodarone (PACERONE) 200 MG tablet TAKE 1 TABLET EVERY DAY  . aspirin 81 MG tablet Take 81 mg by mouth daily.  . Calcium  Carb-Cholecalciferol (CALCIUM-VITAMIN D) 500-200 MG-UNIT tablet Take 1 tablet by mouth daily.   . fexofenadine (ALLEGRA ALLERGY) 180 MG tablet Take 1 tablet (180 mg total) by mouth daily.  . Fluticasone-Umeclidin-Vilant (TRELEGY ELLIPTA) 100-62.5-25 MCG/INH AEPB Inhale 1 puff into the lungs daily.  . furosemide (LASIX) 40 MG tablet TAKE 1 TABLET (40 MG TOTAL) BY MOUTH DAILY.  . isosorbide mononitrate (IMDUR) 30 MG 24 hr tablet TAKE 1 TABLET EVERY DAY  . Multiple Vitamin (MULTIVITAMIN) tablet Take 1 tablet by mouth.   . pravastatin (PRAVACHOL) 20 MG tablet Take 1 tablet (20 mg total) by mouth daily.      Review of Systems      Review of Systems  Constitution: Negative for chills, fever and malaise/fatigue.  Cardiovascular: Positive for dyspnea on exertion (when outdoors), leg swelling and orthopnea ("sleep on a lot of pillows"). Negative for chest pain, irregular heartbeat, near-syncope, palpitations, paroxysmal nocturnal dyspnea and syncope.  Respiratory: Negative for cough, shortness of breath and wheezing.   Skin:  Positive for color change (bilateral LE).  Musculoskeletal: Positive for arthritis (hands).  Gastrointestinal: Negative for nausea and vomiting.  Neurological: Negative for dizziness, light-headedness and weakness.   All other systems reviewed and are otherwise negative except as noted above.  Physical Exam    VS:  BP 90/70 (BP Location: Right Arm, Patient Position: Sitting, Cuff Size: Normal)   Pulse 67   Ht 5\' 11"  (1.803 m)   Wt 157 lb 8 oz (71.4 kg)   SpO2 91%   BMI 21.97 kg/m  , BMI Body mass index is 21.97 kg/m. GEN: Thin gentleman, well developed, in no acute distress. HEENT: normal. Neck: Supple, no JVD, carotid bruits, or masses. Cardiac: RRR, no murmurs, rubs, or gallops. No clubbing, cyanosis. Trace pedal edema bilaterally.  Radials 2+ DP/PT 1+ and equal bilaterally.  Respiratory:  Respirations regular and unlabored, clear to auscultation bilaterally. GI:  Soft, nontender, nondistended, BS + x 4. MS: No deformity or atrophy with exception of large knuckles of bilateral hands secondary to arthritis. Skin: Warm and dry, no rash. Bilateral feet are dry, hyperpigmented with some scaling to bilateral ankle. No abrasion, no exudate, no erythema.  Neuro:  Strength and sensation are intact. Psych: Normal affect.  Accessory Clinical Findings    ECG personally reviewed by me today - SR with 1st degree AV block, left axis deviation, RBBB, stable T wave inversion lead aVR, V1, aVL, V2 when compared to EKG 03/2018 - no acute changes.  Assessment & Plan    1. Dilated cardiomyopathy - Echo 06/19/18 with EF 35-40%, severe asymmetric septal LV hypertrophy. He has not tolerated guideline directed medical therapy due to dizziness, hypotension, financial constraints.   2. Combined systolic and diastolic heart failure - Reports dyspnea on exertion when outside in the heat, encouraged to avoid warm parts of the day. Reports lower extremity edema worse at end of day. Bilateral trace pedal edema, otherwise euvolemic on exam. Continue compression stockings, low salt diet. Encouraged to elevate lower extremities when sitting. Will defer diuretic management to nephrology, as below.   3. Hypotension - BP 90/70 by CMA and 90/60 on recheck by me. Not on any BP lowering medications. Does not check BP at home. He is agreeable to check BP at home and report in one week. Concern for orthostasis. Will start Midodrine 2.5 mg twice daily.   4. CKD 4-5 - Jan 2020 creatinine 2.81, GFR 28. Labs this morning 01/08/19 at oncologist via [facility] creatinine 4.46, GFR 13. Will make urgent referral to nephrology for management of diuresis and kidney function. He requests to go to a nephrologist in [facility] with [facility].   5. PAF - EKG today with SR. Continue Amiodarone 200 mg daily. Recently refilled Eliquis 2.5 mg BID but has not been taking. Encouraged that to prevent  stroke in the setting of PAF (CHADS2VASC 4) we would encourage him to continue Eliquis - he tells me he will confirm with his oncologist.   6. Bilateral lower extremity venous stasis dermatitis - Noted on exam. Daughter describes this "discoloration" as being present for some time. Encouraged emollient prior to compression stockings in the morning.   7. Diffuse large b-cell lymphoma - Following with Oncology at [facility].   Disposition: Urgent referral to nephrology. Follow up with Dr. Agustin Cree in 6 weeks.   Loel Dubonnet, NP 01/09/2019, 7:56 AM

## 2019-01-08 ENCOUNTER — Encounter: Payer: Self-pay | Admitting: Family

## 2019-01-08 ENCOUNTER — Other Ambulatory Visit: Payer: Self-pay

## 2019-01-08 ENCOUNTER — Ambulatory Visit (INDEPENDENT_AMBULATORY_CARE_PROVIDER_SITE_OTHER): Payer: Medicare HMO | Admitting: Family

## 2019-01-08 VITALS — BP 90/70 | HR 67 | Ht 71.0 in | Wt 157.5 lb

## 2019-01-08 DIAGNOSIS — N184 Chronic kidney disease, stage 4 (severe): Secondary | ICD-10-CM | POA: Diagnosis not present

## 2019-01-08 DIAGNOSIS — R7989 Other specified abnormal findings of blood chemistry: Secondary | ICD-10-CM | POA: Diagnosis not present

## 2019-01-08 DIAGNOSIS — I420 Dilated cardiomyopathy: Secondary | ICD-10-CM | POA: Diagnosis not present

## 2019-01-08 DIAGNOSIS — D709 Neutropenia, unspecified: Secondary | ICD-10-CM | POA: Diagnosis not present

## 2019-01-08 DIAGNOSIS — R634 Abnormal weight loss: Secondary | ICD-10-CM | POA: Diagnosis not present

## 2019-01-08 DIAGNOSIS — I5042 Chronic combined systolic (congestive) and diastolic (congestive) heart failure: Secondary | ICD-10-CM | POA: Diagnosis not present

## 2019-01-08 DIAGNOSIS — I480 Paroxysmal atrial fibrillation: Secondary | ICD-10-CM

## 2019-01-08 DIAGNOSIS — D72819 Decreased white blood cell count, unspecified: Secondary | ICD-10-CM | POA: Diagnosis not present

## 2019-01-08 DIAGNOSIS — I872 Venous insufficiency (chronic) (peripheral): Secondary | ICD-10-CM

## 2019-01-08 DIAGNOSIS — I950 Idiopathic hypotension: Secondary | ICD-10-CM

## 2019-01-08 DIAGNOSIS — C8331 Diffuse large B-cell lymphoma, lymph nodes of head, face, and neck: Secondary | ICD-10-CM | POA: Diagnosis not present

## 2019-01-08 DIAGNOSIS — R944 Abnormal results of kidney function studies: Secondary | ICD-10-CM | POA: Diagnosis not present

## 2019-01-08 MED ORDER — MIDODRINE HCL 2.5 MG PO TABS: 2.5000 mg | ORAL_TABLET | Freq: Two times a day (BID) | ORAL | 0 refills | Status: DC

## 2019-01-08 NOTE — Patient Instructions (Addendum)
Medication Instructions:   START Midodrine 2.5 mg (one tablet) twice daily  If you need a refill on your cardiac medications before your next appointment, please call your pharmacy.   Lab work: No lab work today.  If you have labs (blood work) drawn today and your tests are completely normal, you will receive your results only by: Marland Kitchen MyChart Message (if you have MyChart) OR . A paper copy in the mail If you have any lab test that is abnormal or we need to change your treatment, we will call you to review the results.  Testing/Procedures: You had an EKG today.   Follow-Up: At [facility], you and your health needs are our priority.  As part of our continuing mission to provide you with exceptional heart care, we have created designated Provider Care Teams.  These Care Teams include your primary Cardiologist (physician) and Advanced Practice Providers (APPs -  Physician Assistants and Nurse Practitioners) who all work together to provide you with the care you need, when you need it. You will need a follow up appointment in 3 weeks.  Please call our office 2 months in advance to schedule this appointment.  You may see Eric Campus, Eric Herring or another member of our [place] Provider Team in [place]: Eric More, Eric Herring . Eric Heinz, Eric Herring . Eric Salines, Eric Herring . Eric Montana, Eric Herring   Any Other Special Instructions Will Be Listed Below (If Applicable).  Recommend good emollient lotion or cream on legs in the morning before placing compression stockings.   Recommend checking your blood pressure every day and keeping a log. Please bring to your next office visit.   Please call our office in 1 week with report of blood pressures.   You will be referred to nephrology (kidney doctor). If you do not hear from them by Tuesday, please call our office.   Recommend elevating lower extremities when sitting.

## 2019-01-09 ENCOUNTER — Encounter: Payer: Self-pay | Admitting: Family

## 2019-01-10 DIAGNOSIS — R7989 Other specified abnormal findings of blood chemistry: Secondary | ICD-10-CM | POA: Diagnosis not present

## 2019-01-10 DIAGNOSIS — C8331 Diffuse large B-cell lymphoma, lymph nodes of head, face, and neck: Secondary | ICD-10-CM | POA: Diagnosis not present

## 2019-01-10 DIAGNOSIS — R944 Abnormal results of kidney function studies: Secondary | ICD-10-CM | POA: Diagnosis not present

## 2019-01-10 DIAGNOSIS — C61 Malignant neoplasm of prostate: Secondary | ICD-10-CM | POA: Diagnosis not present

## 2019-01-17 DIAGNOSIS — J439 Emphysema, unspecified: Secondary | ICD-10-CM | POA: Diagnosis not present

## 2019-01-17 DIAGNOSIS — I700 Atherosclerosis of aorta: Secondary | ICD-10-CM | POA: Diagnosis not present

## 2019-01-17 DIAGNOSIS — N62 Hypertrophy of breast: Secondary | ICD-10-CM | POA: Diagnosis not present

## 2019-01-17 DIAGNOSIS — M4328 Fusion of spine, sacral and sacrococcygeal region: Secondary | ICD-10-CM | POA: Diagnosis not present

## 2019-01-17 DIAGNOSIS — C8331 Diffuse large B-cell lymphoma, lymph nodes of head, face, and neck: Secondary | ICD-10-CM | POA: Diagnosis not present

## 2019-01-17 DIAGNOSIS — I517 Cardiomegaly: Secondary | ICD-10-CM | POA: Diagnosis not present

## 2019-01-17 DIAGNOSIS — R911 Solitary pulmonary nodule: Secondary | ICD-10-CM | POA: Diagnosis not present

## 2019-01-17 DIAGNOSIS — I2510 Atherosclerotic heart disease of native coronary artery without angina pectoris: Secondary | ICD-10-CM | POA: Diagnosis not present

## 2019-01-17 DIAGNOSIS — C8510 Unspecified B-cell lymphoma, unspecified site: Secondary | ICD-10-CM | POA: Diagnosis not present

## 2019-01-21 DIAGNOSIS — I5043 Acute on chronic combined systolic (congestive) and diastolic (congestive) heart failure: Secondary | ICD-10-CM | POA: Diagnosis not present

## 2019-01-21 DIAGNOSIS — J9621 Acute and chronic respiratory failure with hypoxia: Secondary | ICD-10-CM | POA: Diagnosis not present

## 2019-01-21 DIAGNOSIS — J449 Chronic obstructive pulmonary disease, unspecified: Secondary | ICD-10-CM | POA: Diagnosis not present

## 2019-01-21 DIAGNOSIS — R062 Wheezing: Secondary | ICD-10-CM | POA: Diagnosis not present

## 2019-01-23 DIAGNOSIS — I5022 Chronic systolic (congestive) heart failure: Secondary | ICD-10-CM

## 2019-01-23 DIAGNOSIS — N185 Chronic kidney disease, stage 5: Secondary | ICD-10-CM | POA: Diagnosis not present

## 2019-01-23 DIAGNOSIS — N179 Acute kidney failure, unspecified: Secondary | ICD-10-CM | POA: Diagnosis not present

## 2019-01-23 DIAGNOSIS — I950 Idiopathic hypotension: Secondary | ICD-10-CM | POA: Diagnosis not present

## 2019-01-23 HISTORY — DX: Chronic systolic (congestive) heart failure: I50.22

## 2019-01-27 DIAGNOSIS — D709 Neutropenia, unspecified: Secondary | ICD-10-CM | POA: Diagnosis not present

## 2019-01-27 DIAGNOSIS — D72819 Decreased white blood cell count, unspecified: Secondary | ICD-10-CM | POA: Diagnosis not present

## 2019-01-27 DIAGNOSIS — Z9221 Personal history of antineoplastic chemotherapy: Secondary | ICD-10-CM | POA: Diagnosis not present

## 2019-01-27 DIAGNOSIS — C8331 Diffuse large B-cell lymphoma, lymph nodes of head, face, and neck: Secondary | ICD-10-CM | POA: Diagnosis not present

## 2019-01-27 DIAGNOSIS — Z79899 Other long term (current) drug therapy: Secondary | ICD-10-CM | POA: Diagnosis not present

## 2019-01-29 DIAGNOSIS — Z85841 Personal history of malignant neoplasm of brain: Secondary | ICD-10-CM | POA: Diagnosis not present

## 2019-01-29 DIAGNOSIS — D709 Neutropenia, unspecified: Secondary | ICD-10-CM | POA: Diagnosis not present

## 2019-01-29 DIAGNOSIS — Z8572 Personal history of non-Hodgkin lymphomas: Secondary | ICD-10-CM | POA: Diagnosis not present

## 2019-01-29 DIAGNOSIS — D469 Myelodysplastic syndrome, unspecified: Secondary | ICD-10-CM | POA: Diagnosis not present

## 2019-02-10 ENCOUNTER — Ambulatory Visit: Payer: Medicare HMO | Admitting: Cardiology

## 2019-02-13 DIAGNOSIS — M064 Inflammatory polyarthropathy: Secondary | ICD-10-CM | POA: Diagnosis not present

## 2019-02-13 DIAGNOSIS — Z7982 Long term (current) use of aspirin: Secondary | ICD-10-CM | POA: Diagnosis not present

## 2019-02-13 DIAGNOSIS — Z87891 Personal history of nicotine dependence: Secondary | ICD-10-CM | POA: Diagnosis not present

## 2019-02-13 DIAGNOSIS — Z6825 Body mass index (BMI) 25.0-25.9, adult: Secondary | ICD-10-CM | POA: Diagnosis not present

## 2019-02-17 ENCOUNTER — Ambulatory Visit: Payer: Medicare HMO | Admitting: Cardiology

## 2019-02-20 ENCOUNTER — Ambulatory Visit: Payer: Medicare HMO | Admitting: Cardiology

## 2019-02-20 DIAGNOSIS — J449 Chronic obstructive pulmonary disease, unspecified: Secondary | ICD-10-CM | POA: Diagnosis not present

## 2019-02-20 DIAGNOSIS — J9621 Acute and chronic respiratory failure with hypoxia: Secondary | ICD-10-CM | POA: Diagnosis not present

## 2019-02-20 DIAGNOSIS — R062 Wheezing: Secondary | ICD-10-CM | POA: Diagnosis not present

## 2019-02-20 DIAGNOSIS — I5043 Acute on chronic combined systolic (congestive) and diastolic (congestive) heart failure: Secondary | ICD-10-CM | POA: Diagnosis not present

## 2019-02-21 ENCOUNTER — Other Ambulatory Visit: Payer: Self-pay

## 2019-02-21 ENCOUNTER — Ambulatory Visit (INDEPENDENT_AMBULATORY_CARE_PROVIDER_SITE_OTHER): Payer: Medicare HMO | Admitting: Cardiology

## 2019-02-21 ENCOUNTER — Encounter: Payer: Self-pay | Admitting: Cardiology

## 2019-02-21 VITALS — BP 120/64 | HR 86 | Ht 71.0 in | Wt 157.0 lb

## 2019-02-21 DIAGNOSIS — R600 Localized edema: Secondary | ICD-10-CM

## 2019-02-21 DIAGNOSIS — I5022 Chronic systolic (congestive) heart failure: Secondary | ICD-10-CM

## 2019-02-21 DIAGNOSIS — I480 Paroxysmal atrial fibrillation: Secondary | ICD-10-CM

## 2019-02-21 DIAGNOSIS — I340 Nonrheumatic mitral (valve) insufficiency: Secondary | ICD-10-CM

## 2019-02-21 DIAGNOSIS — J432 Centrilobular emphysema: Secondary | ICD-10-CM | POA: Diagnosis not present

## 2019-02-21 NOTE — Progress Notes (Signed)
Cardiology Office Note:    Date:  02/21/2019   ID:  Eric Herring, DOB [birth year], MRN [id number]  PCP:  Shelda Pal, DO  Cardiologist:  Jenne Campus, MD    Referring MD: Shelda Pal*   Chief Complaint  Patient presents with  . Follow-up  Doing much better  History of Present Illness:    Eric Herring is a 84 y.o. male with cardiomyopathy ejection fraction 35%, paroxysmal atrial fibrillation, coronary artery disease, chronic kidney disease comes today to monitor for follow-up last time he was seen because of dizziness.  He was given some midodrine however later his diuretic has been discontinued since that time he does not have any dizziness.  They brought me his blood pressure measurements usually go to only twice in consideration that his blood pressure systolic was less than 90 usually between 76-546 systolic.  We did talk in length about the situation I prefer him not to use midodrine.  We will discontinue this medication.  He is also going to stay away from Lasix as well as potassium.  I told him if his weights are going up the legs are swelling he needs to let me know and then we will restart this medication appropriately smaller dose.  Otherwise he still grieving after his wife passing.  But denies having any chest pain, tightness, pressure in the chest  Past Medical History:  Diagnosis Date  . Allergy   . Aortic atherosclerosis ([place])   . Arthropathy   . CAD (coronary artery disease)   . Cardiomyopathy ([place])   . Chronic kidney disease   . Colon polyp   . COPD (chronic obstructive pulmonary disease) ([place])   . Diabetes ([place])   . Diabetic neuropathy ([place])   . Heart murmur   . Hypercholesterolemia   . Hypertension    pulmonary  . LVH (left ventricular hypertrophy)   . Lymphoma ([place])   . Mitral regurgitation   . Prostate cancer ([place])   . Pulmonary hypertension ([place])   . Tricuspid regurgitation     Past Surgical History:  Procedure  Laterality Date  . HEMORROIDECTOMY     pt does not remember year    Current Medications: Current Meds  Medication Sig  . albuterol (PROVENTIL HFA;VENTOLIN HFA) 108 (90 Base) MCG/ACT inhaler Inhale 2 puffs into the lungs every 6 (six) hours as needed for wheezing or shortness of breath.  Marland Kitchen albuterol (PROVENTIL) (2.5 MG/3ML) 0.083% nebulizer solution USE 1 VIAL IN NEBULIZER EVERY 6 HOURS AS NEEDED FOR WHEEZING FOR SHORTNESS OF BREATH  . amiodarone (PACERONE) 200 MG tablet TAKE 1 TABLET EVERY DAY  . aspirin 81 MG tablet Take 81 mg by mouth daily.  . Calcium Carb-Cholecalciferol (CALCIUM-VITAMIN D) 500-200 MG-UNIT tablet Take 1 tablet by mouth daily.   . fexofenadine (ALLEGRA ALLERGY) 180 MG tablet Take 1 tablet (180 mg total) by mouth daily.  . Fluticasone-Umeclidin-Vilant (TRELEGY ELLIPTA) 100-62.5-25 MCG/INH AEPB Inhale 1 puff into the lungs daily.  . furosemide (LASIX) 40 MG tablet TAKE 1 TABLET (40 MG TOTAL) BY MOUTH DAILY.  . isosorbide mononitrate (IMDUR) 30 MG 24 hr tablet TAKE 1 TABLET EVERY DAY  . midodrine (PROAMATINE) 2.5 MG tablet Take 1 tablet (2.5 mg total) by mouth 2 (two) times daily with a meal.  . Multiple Vitamin (MULTIVITAMIN) tablet Take 1 tablet by mouth.   . potassium chloride (K-DUR) 10 MEQ tablet Take 1 tablet (10 mEq total) by mouth daily.  . pravastatin (PRAVACHOL) 20 MG tablet Take  1 tablet (20 mg total) by mouth daily.     Allergies:   Lisinopril   Social History   Socioeconomic History  . Marital status: Married    Spouse name: Not on file  . Number of children: Not on file  . Years of education: Not on file  . Highest education level: Not on file  Occupational History  . Not on file  Social Needs  . Financial resource strain: Not on file  . Food insecurity    Worry: Not on file    Inability: Not on file  . Transportation needs    Medical: Not on file    Non-medical: Not on file  Tobacco Use  . Smoking status: Former Smoker    Packs/day: 1.00     Years: 25.00    Pack years: 25.00    Types: Cigarettes    Quit date: 11/30/2007    Years since quitting: 11.2  . Smokeless tobacco: Never Used  Substance and Sexual Activity  . Alcohol use: No  . Drug use: No  . Sexual activity: Not on file  Lifestyle  . Physical activity    Days per week: Not on file    Minutes per session: Not on file  . Stress: Not on file  Relationships  . Social Herbalist on phone: Not on file    Gets together: Not on file    Attends religious service: Not on file    Active member of club or organization: Not on file    Attends meetings of clubs or organizations: Not on file    Relationship status: Not on file  Other Topics Concern  . Not on file  Social History Narrative  . Not on file     Family History: The patient's family history includes COPD in his father. ROS:   Please see the history of present illness.    All 14 point review of systems negative except as described per history of present illness  EKGs/Labs/Other Studies Reviewed:      Recent Labs: 04/01/2018: Hemoglobin 12.1; NT-Pro BNP 4,656; Platelets 188 05/16/2018: BUN 24; Creatinine, Ser 2.81; Potassium 4.4; Sodium 137  Recent Lipid Panel No results found for: CHOL, TRIG, HDL, CHOLHDL, VLDL, LDLCALC, LDLDIRECT  Physical Exam:    VS:  BP 120/64   Pulse 86   Ht 5\' 11"  (1.803 m)   Wt 157 lb (71.2 kg)   SpO2 92%   BMI 21.90 kg/m     Wt Readings from Last 3 Encounters:  02/21/19 157 lb (71.2 kg)  01/08/19 157 lb 8 oz (71.4 kg)  07/17/18 160 lb (72.6 kg)     GEN:  Well nourished, well developed in no acute distress HEENT: Normal NECK: No JVD; No carotid bruits LYMPHATICS: No lymphadenopathy CARDIAC: RRR, no murmurs, no rubs, no gallops RESPIRATORY:  Clear to auscultation without rales, wheezing or rhonchi  ABDOMEN: Soft, non-tender, non-distended MUSCULOSKELETAL:  No edema; No deformity  SKIN: Warm and dry LOWER EXTREMITIES: no swelling NEUROLOGIC:  Alert  and oriented x 3 PSYCHIATRIC:  Normal affect   ASSESSMENT:    1. Chronic systolic congestive heart failure (HCC)   2. Paroxysmal atrial fibrillation ([place])   3. Nonrheumatic mitral valve regurgitation   4. Centrilobular emphysema ([place])    PLAN:    In order of problems listed above:  1. Chronic systolic congestive heart failure medication that he can tolerate of course to limit is the fact that he is having low blood  pressure.  Overall hemodynamically compensated 2. Paroxysmal atrial fibrillation seems to be normal sinus rhythm will continue not anticoagulated because of fragile state and multiple risk factors 3. Nonrheumatic mitral valve regurgitation doing well 4. Emphysema noted 5. Orthostatic hypotension dramatically improved after discontinuation of furosemide.   Medication Adjustments/Labs and Tests Ordered: Current medicines are reviewed at length with the patient today.  Concerns regarding medicines are outlined above.  No orders of the defined types were placed in this encounter.  Medication changes: No orders of the defined types were placed in this encounter.   Signed, Park Liter, MD, [facility] 02/21/2019 4:28 PM    [place]

## 2019-02-21 NOTE — Patient Instructions (Signed)
Medication Instructions:  Your physician has recommended you make the following change in your medication:   STOP TAKING: Potassium, Lasix, Midodrine  *If you need a refill on your cardiac medications before your next appointment, please call your pharmacy*  Lab Work: None Ordered   Testing/Procedures: None Ordered  Follow-Up:  Your next appointment:   3 months  The format for your next appointment:   In Person  Provider:   You may see Jenne Campus, MD or the following Advanced Practice Provider on your designated Care Team:    Laurann Montana, FNP   Other Instructions

## 2019-02-24 DIAGNOSIS — Z6826 Body mass index (BMI) 26.0-26.9, adult: Secondary | ICD-10-CM | POA: Diagnosis not present

## 2019-02-24 DIAGNOSIS — Z79899 Other long term (current) drug therapy: Secondary | ICD-10-CM | POA: Diagnosis not present

## 2019-02-24 DIAGNOSIS — D72819 Decreased white blood cell count, unspecified: Secondary | ICD-10-CM | POA: Diagnosis not present

## 2019-02-24 DIAGNOSIS — R634 Abnormal weight loss: Secondary | ICD-10-CM | POA: Diagnosis not present

## 2019-02-24 DIAGNOSIS — C8381 Other non-follicular lymphoma, lymph nodes of head, face, and neck: Secondary | ICD-10-CM | POA: Diagnosis not present

## 2019-03-07 ENCOUNTER — Telehealth: Payer: Self-pay | Admitting: Cardiology

## 2019-03-07 NOTE — Telephone Encounter (Signed)
°*  STAT* If patient is at the pharmacy, call can be transferred to refill team.   1. Which medications need to be refilled? (please list name of each medication and dose if known) Albuterol 2.5mg   2. Which pharmacy/location (including street and city if local pharmacy) is medication to be sent to?[facility] on [address]  3. Do they need a 30 day or 90 day supply? [place]

## 2019-03-10 NOTE — Telephone Encounter (Signed)
Attempted to contact patient no answer and voicemail box full. This refill will need to go through pcp. Will continue efforts.

## 2019-03-11 NOTE — Telephone Encounter (Signed)
Attempted to contact patient again. No asnwer. Will continue efforts.

## 2019-03-12 NOTE — Telephone Encounter (Signed)
Called patient informed him that his pcp will need to refill this. Patient verbally understands. No further questions.

## 2019-03-23 DIAGNOSIS — R062 Wheezing: Secondary | ICD-10-CM | POA: Diagnosis not present

## 2019-03-23 DIAGNOSIS — J449 Chronic obstructive pulmonary disease, unspecified: Secondary | ICD-10-CM | POA: Diagnosis not present

## 2019-03-23 DIAGNOSIS — I5043 Acute on chronic combined systolic (congestive) and diastolic (congestive) heart failure: Secondary | ICD-10-CM | POA: Diagnosis not present

## 2019-03-23 DIAGNOSIS — J9621 Acute and chronic respiratory failure with hypoxia: Secondary | ICD-10-CM | POA: Diagnosis not present

## 2019-03-31 DIAGNOSIS — M7989 Other specified soft tissue disorders: Secondary | ICD-10-CM | POA: Diagnosis not present

## 2019-03-31 DIAGNOSIS — I5022 Chronic systolic (congestive) heart failure: Secondary | ICD-10-CM | POA: Diagnosis not present

## 2019-03-31 DIAGNOSIS — I1310 Hypertensive heart and chronic kidney disease without heart failure, with stage 1 through stage 4 chronic kidney disease, or unspecified chronic kidney disease: Secondary | ICD-10-CM | POA: Diagnosis not present

## 2019-04-22 DIAGNOSIS — J449 Chronic obstructive pulmonary disease, unspecified: Secondary | ICD-10-CM | POA: Diagnosis not present

## 2019-04-22 DIAGNOSIS — I5043 Acute on chronic combined systolic (congestive) and diastolic (congestive) heart failure: Secondary | ICD-10-CM | POA: Diagnosis not present

## 2019-04-22 DIAGNOSIS — R062 Wheezing: Secondary | ICD-10-CM | POA: Diagnosis not present

## 2019-04-22 DIAGNOSIS — J9621 Acute and chronic respiratory failure with hypoxia: Secondary | ICD-10-CM | POA: Diagnosis not present

## 2019-04-24 DIAGNOSIS — Z79899 Other long term (current) drug therapy: Secondary | ICD-10-CM | POA: Diagnosis not present

## 2019-04-24 DIAGNOSIS — D696 Thrombocytopenia, unspecified: Secondary | ICD-10-CM | POA: Diagnosis not present

## 2019-04-24 DIAGNOSIS — Z23 Encounter for immunization: Secondary | ICD-10-CM | POA: Diagnosis not present

## 2019-04-24 DIAGNOSIS — C8331 Diffuse large B-cell lymphoma, lymph nodes of head, face, and neck: Secondary | ICD-10-CM | POA: Diagnosis not present

## 2019-05-14 ENCOUNTER — Other Ambulatory Visit: Payer: Self-pay

## 2019-05-14 ENCOUNTER — Ambulatory Visit (INDEPENDENT_AMBULATORY_CARE_PROVIDER_SITE_OTHER): Payer: Medicare HMO | Admitting: Family Medicine

## 2019-05-14 ENCOUNTER — Encounter: Payer: Self-pay | Admitting: Family Medicine

## 2019-05-14 ENCOUNTER — Other Ambulatory Visit: Payer: Self-pay | Admitting: Family Medicine

## 2019-05-14 DIAGNOSIS — R600 Localized edema: Secondary | ICD-10-CM

## 2019-05-14 DIAGNOSIS — R32 Unspecified urinary incontinence: Secondary | ICD-10-CM

## 2019-05-14 MED ORDER — POTASSIUM CHLORIDE ER 10 MEQ PO TBCR: EXTENDED_RELEASE_TABLET | ORAL | 2 refills | Status: DC

## 2019-05-14 MED ORDER — FUROSEMIDE 40 MG PO TABS: 40.0000 mg | ORAL_TABLET | Freq: Every day | ORAL | 3 refills | Status: DC

## 2019-05-14 NOTE — Progress Notes (Signed)
Chief Complaint  Patient presents with  . Edema    feet and legs for 2 to 3 weeks.    Subjective: Patient is a 85 y.o. male w past medhx of cardiorenal syndrome here for swelling in his feet. Due to COVID-19 pandemic, we are interacting via telephone. I verified patient's ID using 2 identifiers. Patient agreed to proceed with visit via this method. Patient is at home, I am at home. Patient, his daughter and I are present for visit.   Over past 2 weeks, he has noticed swelling in his feet. He has been elevating his legs that helps, but it comes back when he lets them down. Reports he has not been taking Lasix. Weighs 185 lbs today. His wt less than 3 mo ago was 157 lbs. No changes in diet reported.   ROS: Heart: Denies chest pain  Lungs: Denies SOB   Past Medical History:  Diagnosis Date  . Allergy   . Aortic atherosclerosis ([place])   . Arthropathy   . CAD (coronary artery disease)   . Cardiomyopathy ([place])   . Chronic kidney disease   . Colon polyp   . COPD (chronic obstructive pulmonary disease) ([place])   . Diabetes ([place])   . Diabetic neuropathy ([place])   . Heart murmur   . Hypercholesterolemia   . Hypertension    pulmonary  . LVH (left ventricular hypertrophy)   . Lymphoma ([place])   . Mitral regurgitation   . Prostate cancer ([place])   . Pulmonary hypertension ([place])   . Tricuspid regurgitation     Objective: No conversational dyspnea Age appropriate judgment and insight Nml affect and mood  Assessment and Plan: Lower extremity edema - Plan: furosemide (LASIX) 40 MG tablet, potassium chloride (KLOR-CON) 10 MEQ tablet  Restart Lasix w 20 mEq K w each dose. Take in AM. We will see him in 1 week for his yearly and ck on his labs. Total time: 11 min The patient and his daughter voiced understanding and agreement to the plan.  [place], DO 05/14/19  12:59 PM

## 2019-05-23 ENCOUNTER — Encounter: Payer: Self-pay | Admitting: Family Medicine

## 2019-05-23 ENCOUNTER — Ambulatory Visit (INDEPENDENT_AMBULATORY_CARE_PROVIDER_SITE_OTHER): Payer: Medicare HMO | Admitting: Family Medicine

## 2019-05-23 ENCOUNTER — Other Ambulatory Visit: Payer: Self-pay

## 2019-05-23 VITALS — BP 122/80 | HR 70 | Temp 96.2°F | Ht 68.0 in | Wt 184.0 lb

## 2019-05-23 DIAGNOSIS — Z0000 Encounter for general adult medical examination without abnormal findings: Secondary | ICD-10-CM | POA: Diagnosis not present

## 2019-05-23 DIAGNOSIS — E1149 Type 2 diabetes mellitus with other diabetic neurological complication: Secondary | ICD-10-CM | POA: Diagnosis not present

## 2019-05-23 DIAGNOSIS — R062 Wheezing: Secondary | ICD-10-CM | POA: Diagnosis not present

## 2019-05-23 DIAGNOSIS — I5043 Acute on chronic combined systolic (congestive) and diastolic (congestive) heart failure: Secondary | ICD-10-CM | POA: Diagnosis not present

## 2019-05-23 DIAGNOSIS — J9621 Acute and chronic respiratory failure with hypoxia: Secondary | ICD-10-CM | POA: Diagnosis not present

## 2019-05-23 DIAGNOSIS — J449 Chronic obstructive pulmonary disease, unspecified: Secondary | ICD-10-CM | POA: Diagnosis not present

## 2019-05-23 LAB — COMPREHENSIVE METABOLIC PANEL
ALT: 17 U/L (ref 0–53)
AST: 27 U/L (ref 0–37)
Albumin: 4.2 g/dL (ref 3.5–5.2)
Alkaline Phosphatase: 79 U/L (ref 39–117)
BUN: 32 mg/dL — ABNORMAL HIGH (ref 6–23)
CO2: 27 mEq/L (ref 19–32)
Calcium: 9.8 mg/dL (ref 8.4–10.5)
Chloride: 104 mEq/L (ref 96–112)
Creatinine, Ser: 3.22 mg/dL — ABNORMAL HIGH (ref 0.40–1.50)
GFR: 22.41 mL/min — ABNORMAL LOW (ref >60.00)
Glucose, Bld: 82 mg/dL (ref 70–99)
Potassium: 5.3 mEq/L — ABNORMAL HIGH (ref 3.5–5.1)
Sodium: 139 mEq/L (ref 135–145)
Total Bilirubin: 0.6 mg/dL (ref 0.2–1.2)
Total Protein: 7.7 g/dL (ref 6.0–8.3)

## 2019-05-23 LAB — LIPID PANEL
Cholesterol: 154 mg/dL (ref 0–200)
HDL: 48.7 mg/dL (ref >39.00)
LDL Cholesterol: 88 mg/dL (ref 0–99)
NonHDL: 105.76
Total CHOL/HDL Ratio: 3
Triglycerides: 87 mg/dL (ref 0.0–149.0)
VLDL: 17.4 mg/dL (ref 0.0–40.0)

## 2019-05-23 LAB — HEMOGLOBIN A1C: Hgb A1c MFr Bld: 6 % (ref 4.6–6.5)

## 2019-05-23 LAB — MICROALBUMIN / CREATININE URINE RATIO
Creatinine,U: 131.4 mg/dL
Microalb Creat Ratio: 10.8 mg/g (ref 0.0–30.0)
Microalb, Ur: 14.2 mg/dL — ABNORMAL HIGH (ref 0.0–1.9)

## 2019-05-23 NOTE — Progress Notes (Signed)
Chief Complaint  Patient presents with  . Edema    Well Male Eric Herring is here for a complete physical.   His last physical was >1 year ago.  Current diet: in general, a "healthy" diet.   Current exercise: active in house Weight trend: stable when he is on his water pill Daytime fatigue? No. Seat belt? Yes.    Health maintenance Shingrix- No Tetanus- No Pneumonia vaccine- Yes  Past Medical History:  Diagnosis Date  . Allergy   . Aortic atherosclerosis ([place])   . Arthropathy   . CAD (coronary artery disease)   . Cardiomyopathy ([place])   . Chronic kidney disease   . Colon polyp   . COPD (chronic obstructive pulmonary disease) ([place])   . Diabetes ([place])   . Diabetic neuropathy ([place])   . Heart murmur   . Hypercholesterolemia   . Hypertension    pulmonary  . LVH (left ventricular hypertrophy)   . Lymphoma ([place])   . Mitral regurgitation   . Prostate cancer ([place])   . Pulmonary hypertension ([place])   . Tricuspid regurgitation      Past Surgical History:  Procedure Laterality Date  . HEMORROIDECTOMY     pt does not remember year    Medications  Current Outpatient Medications on File Prior to Visit  Medication Sig Dispense Refill  . albuterol (PROVENTIL HFA;VENTOLIN HFA) 108 (90 Base) MCG/ACT inhaler Inhale 2 puffs into the lungs every 6 (six) hours as needed for wheezing or shortness of breath.    Marland Kitchen albuterol (PROVENTIL) (2.5 MG/3ML) 0.083% nebulizer solution USE 1 VIAL IN NEBULIZER EVERY 6 HOURS AS NEEDED FOR WHEEZING FOR SHORTNESS OF BREATH 75 mL 0  . amiodarone (PACERONE) 200 MG tablet TAKE 1 TABLET EVERY DAY 90 tablet 0  . aspirin 81 MG tablet Take 81 mg by mouth daily.    . Calcium Carb-Cholecalciferol (CALCIUM-VITAMIN D) 500-200 MG-UNIT tablet Take 1 tablet by mouth daily.     . fexofenadine (ALLEGRA ALLERGY) 180 MG tablet Take 1 tablet (180 mg total) by mouth daily. 30 tablet 5  . Fluticasone-Umeclidin-Vilant (TRELEGY ELLIPTA) 100-62.5-25 MCG/INH AEPB Inhale 1  puff into the lungs daily. 30 each 5  . furosemide (LASIX) 40 MG tablet Take 1 tablet (40 mg total) by mouth daily. 30 tablet 3  . isosorbide mononitrate (IMDUR) 30 MG 24 hr tablet TAKE 1 TABLET EVERY DAY 90 tablet 1  . Multiple Vitamin (MULTIVITAMIN) tablet Take 1 tablet by mouth.     . potassium chloride (KLOR-CON) 10 MEQ tablet Take 2 tabs with every dose of Lasix (40 mg). 60 tablet 2  . pravastatin (PRAVACHOL) 20 MG tablet Take 1 tablet (20 mg total) by mouth daily. 90 tablet 3   Allergies Allergies  Allergen Reactions  . Lisinopril Anaphylaxis and Swelling         Family History Family History  Problem Relation Age of Onset  . COPD Father     Review of Systems: Constitutional:  no fevers or chills Eye:  no recent significant change in vision Ear/Nose/Mouth/Throat:  Ears:  no recent hearing loss Nose/Mouth/Throat:  no complaints of nasal congestion or sore throat Cardiovascular:  +LE edema Respiratory:  no shortness of breath Gastrointestinal:  no abdominal pain, no change in bowel habits GU:  Male: negative for dysuria, frequency, and incontinence  Musculoskeletal/Extremities:  No new pain of the joints Integumentary (Skin):  no abnormal skin lesions reported Neurologic:  no headaches, Endocrine:  No unexpected weight changes Hematologic/Lymphatic:  no areas  of easy bruising  Exam BP 122/80 (BP Location: Left Arm, Patient Position: Sitting, Cuff Size: Normal)   Temp (!) 96.2 F (35.7 C) (Temporal)   Ht _0  (1.727 m)   Wt 184 lb (83.5 kg)   BMI 27.98 kg/m  General:  well developed, well nourished, in no apparent distress Skin:  no significant moles, warts, or growths Head:  no masses, lesions, or tenderness Eyes:  pupils equal and round, sclera anicteric without injection Ears:  canals without lesions, TMs shiny without retraction, no obvious effusion, no erythema Nose:  nares patent, septum midline, mucosa normal Throat/Pharynx:  lips and gingiva without  lesion; tongue and uvula midline; non-inflamed pharynx; no exudates or postnasal drainage Neck: neck supple without adenopathy, thyromegaly, or masses Lungs:  clear to auscultation, breath sounds equal bilaterally, no respiratory distress Cardio:  regular rate and rhythm, 3+ pitting b/l LE edema Rectal: Deferred Musculoskeletal:  symmetrical muscle groups noted without atrophy or deformity Extremities:  no clubbing, cyanosis, or edema, no deformities, no skin discoloration Neuro:  No cerebellar signs Psych: well oriented with normal range of affect and appropriate judgment/insight  Assessment and Plan  Well adult exam - Plan: Comp Met (CMET), Lipid Profile, HgB A1c, Urine Microalbumin w/creat. ratio   Well 85 y.o. male. Counseled on diet and exercise. Needs eye exam. Td and Shingrix thru pharmacy.  Other orders as above. Follow up in 6 mo.  The patient voiced understanding and agreement to the plan.  [place], DO 05/23/19 11:36 AM

## 2019-05-23 NOTE — Patient Instructions (Addendum)
Call your pharmacy to inquire about the tetanus shot.  Give [place] 2-3 business days to get the results of your labs back.   Keep the diet clean and stay active.  Consider getting your eye exam.   The new Shingrix vaccine (for shingles) is a 2 shot series. It can make people feel low energy, achy and almost like they have the flu for 48 hours after injection. Please plan accordingly when deciding on when to get this shot. Call the pharmacy to get this. The second shot of the series is less severe regarding the side effects, but it still lasts 48 hours.    Let us know if you need anything.

## 2019-05-26 ENCOUNTER — Other Ambulatory Visit: Payer: Self-pay | Admitting: Family Medicine

## 2019-05-26 ENCOUNTER — Telehealth: Payer: Self-pay | Admitting: Family Medicine

## 2019-05-26 DIAGNOSIS — E876 Hypokalemia: Secondary | ICD-10-CM

## 2019-05-26 NOTE — Telephone Encounter (Signed)
Pt dropped off document to be filled out [facility]) and ok to call when document ready to pick up at [phone]. Document put at front office tray under providers name.

## 2019-05-26 NOTE — Progress Notes (Signed)
Bmp

## 2019-05-26 NOTE — Telephone Encounter (Signed)
Is in your folder

## 2019-05-26 NOTE — Telephone Encounter (Signed)
PCP completed/Made a copy for scan. Called the Daughter Mellody Life to pickup original.

## 2019-05-27 ENCOUNTER — Other Ambulatory Visit: Payer: Self-pay | Admitting: Family Medicine

## 2019-05-27 DIAGNOSIS — E875 Hyperkalemia: Secondary | ICD-10-CM

## 2019-05-29 ENCOUNTER — Ambulatory Visit: Payer: Medicare HMO | Admitting: Cardiology

## 2019-05-29 ENCOUNTER — Telehealth: Payer: Self-pay | Admitting: Family Medicine

## 2019-05-29 NOTE — Telephone Encounter (Signed)
Hey Dr. [name],  Pat's daughter Mellody Life    States her FMLA paperwork was filled out and it states she was intermitt fmla  But she needs it for [employer] . She was told my FMLA that all she needs is a letter written on letter head with a start date and end date effective 06/09/19-  10/20/19. Please Advise thanks   [phone]....  Please call when ready so she can pick up

## 2019-05-30 ENCOUNTER — Other Ambulatory Visit (INDEPENDENT_AMBULATORY_CARE_PROVIDER_SITE_OTHER): Payer: Medicare HMO

## 2019-05-30 ENCOUNTER — Encounter: Payer: Self-pay | Admitting: Family Medicine

## 2019-05-30 ENCOUNTER — Other Ambulatory Visit: Payer: Self-pay

## 2019-05-30 DIAGNOSIS — E875 Hyperkalemia: Secondary | ICD-10-CM

## 2019-05-30 LAB — BASIC METABOLIC PANEL
BUN: 42 mg/dL — ABNORMAL HIGH (ref 6–23)
CO2: 21 mEq/L (ref 19–32)
Calcium: 9.4 mg/dL (ref 8.4–10.5)
Chloride: 103 mEq/L (ref 96–112)
Creatinine, Ser: 4.04 mg/dL — ABNORMAL HIGH (ref 0.40–1.50)
GFR: 17.25 mL/min — ABNORMAL LOW (ref >60.00)
Glucose, Bld: 82 mg/dL (ref 70–99)
Potassium: 4.9 mEq/L (ref 3.5–5.1)
Sodium: 137 mEq/L (ref 135–145)

## 2019-05-30 NOTE — Telephone Encounter (Signed)
OK to change then. Ty.

## 2019-05-30 NOTE — Telephone Encounter (Signed)
Letter done and called the daughter to pickup

## 2019-05-30 NOTE — Telephone Encounter (Signed)
Why is it continuous? Is she the full time caregiver?

## 2019-05-30 NOTE — Telephone Encounter (Signed)
Called the daughter and she is his full time care giver

## 2019-06-02 ENCOUNTER — Other Ambulatory Visit: Payer: Self-pay | Admitting: Family Medicine

## 2019-06-02 DIAGNOSIS — N289 Disorder of kidney and ureter, unspecified: Secondary | ICD-10-CM

## 2019-06-02 NOTE — Progress Notes (Signed)
mp

## 2019-06-03 ENCOUNTER — Other Ambulatory Visit: Payer: Self-pay

## 2019-06-03 ENCOUNTER — Encounter: Payer: Self-pay | Admitting: Cardiology

## 2019-06-03 ENCOUNTER — Ambulatory Visit (INDEPENDENT_AMBULATORY_CARE_PROVIDER_SITE_OTHER): Payer: Medicare HMO | Admitting: Cardiology

## 2019-06-03 ENCOUNTER — Encounter: Payer: Self-pay | Admitting: Family Medicine

## 2019-06-03 VITALS — BP 128/74 | HR 76 | Ht 68.0 in | Wt 179.0 lb

## 2019-06-03 DIAGNOSIS — I480 Paroxysmal atrial fibrillation: Secondary | ICD-10-CM | POA: Diagnosis not present

## 2019-06-03 DIAGNOSIS — I272 Pulmonary hypertension, unspecified: Secondary | ICD-10-CM | POA: Diagnosis not present

## 2019-06-03 DIAGNOSIS — I5022 Chronic systolic (congestive) heart failure: Secondary | ICD-10-CM

## 2019-06-03 DIAGNOSIS — I420 Dilated cardiomyopathy: Secondary | ICD-10-CM | POA: Diagnosis not present

## 2019-06-03 DIAGNOSIS — I340 Nonrheumatic mitral (valve) insufficiency: Secondary | ICD-10-CM

## 2019-06-03 NOTE — Progress Notes (Signed)
Cardiology Office Note:    Date:  06/03/2019   ID:  Eric Herring, DOB 09/11/1934, MRN [id number]  PCP:  Eric Pal, DO  Cardiologist:  Jenne Campus, MD    Referring MD: Eric Herring*   Chief Complaint  Patient presents with  . Follow-up    Numbness in finger tips     History of Present Illness:    Eric Herring is a 85 y.o. male with past medical history significant for cardiomyopathy with ejection fraction 30 to 35% based on last estimation from year ago, also mitral regurgitation, tricuspid regurgitation, pulmonary hypertension, chronic kidney failure, paroxysmal atrial fibrillation.  He comes today to my office for follow-up the problem that we are facing right now is increased swelling of lower extremities.  Recently he was put back on diuretic which resulted in an increase of his creatinine to 4.04.  Obviously that is concerning.  His legs are much better but still swollen.  He denies having any paroxysmal nocturnal dyspnea, no chest pain no tightness no squeezing no pressure no burning in the chest he said overall he feels better.  Past Medical History:  Diagnosis Date  . Allergy   . Aortic atherosclerosis ([place])   . Arthropathy   . CAD (coronary artery disease)   . Cardiomyopathy ([place])   . Chronic kidney disease   . Colon polyp   . COPD (chronic obstructive pulmonary disease) ([place])   . Diabetes ([place])   . Diabetic neuropathy ([place])   . Heart murmur   . Hypercholesterolemia   . Hypertension    pulmonary  . LVH (left ventricular hypertrophy)   . Lymphoma ([place])   . Mitral regurgitation   . Prostate cancer ([place])   . Pulmonary hypertension ([place])   . Tricuspid regurgitation     Past Surgical History:  Procedure Laterality Date  . HEMORROIDECTOMY     pt does not remember year    Current Medications: Current Meds  Medication Sig  . albuterol (PROVENTIL HFA;VENTOLIN HFA) 108 (90 Base) MCG/ACT inhaler Inhale 2 puffs into the lungs every  6 (six) hours as needed for wheezing or shortness of breath.  Marland Kitchen albuterol (PROVENTIL) (2.5 MG/3ML) 0.083% nebulizer solution USE 1 VIAL IN NEBULIZER EVERY 6 HOURS AS NEEDED FOR WHEEZING FOR SHORTNESS OF BREATH  . amiodarone (PACERONE) 200 MG tablet TAKE 1 TABLET EVERY DAY  . aspirin 81 MG tablet Take 81 mg by mouth daily.  . Calcium Carb-Cholecalciferol (CALCIUM-VITAMIN D) 500-200 MG-UNIT tablet Take 1 tablet by mouth daily.   . fexofenadine (ALLEGRA ALLERGY) 180 MG tablet Take 1 tablet (180 mg total) by mouth daily.  . Fluticasone-Umeclidin-Vilant (TRELEGY ELLIPTA) 100-62.5-25 MCG/INH AEPB Inhale 1 puff into the lungs daily.  . furosemide (LASIX) 40 MG tablet Take 1 tablet (40 mg total) by mouth daily.  . isosorbide mononitrate (IMDUR) 30 MG 24 hr tablet TAKE 1 TABLET EVERY DAY  . Multiple Vitamin (MULTIVITAMIN) tablet Take 1 tablet by mouth.   . potassium chloride (KLOR-CON) 10 MEQ tablet Take 2 tabs with every dose of Lasix (40 mg).  . pravastatin (PRAVACHOL) 20 MG tablet Take 1 tablet (20 mg total) by mouth daily.     Allergies:   Lisinopril   Social History   Socioeconomic History  . Marital status: Married    Spouse name: Not on file  . Number of children: Not on file  . Years of education: Not on file  . Highest education level: Not on file  Occupational History  .  Not on file  Tobacco Use  . Smoking status: Former Smoker    Packs/day: 1.00    Years: 25.00    Pack years: 25.00    Types: Cigarettes    Quit date: 11/30/2007    Years since quitting: 11.5  . Smokeless tobacco: Never Used  Substance and Sexual Activity  . Alcohol use: No  . Drug use: No  . Sexual activity: Not on file  Other Topics Concern  . Not on file  Social History Narrative  . Not on file   Social Determinants of Health   Financial Resource Strain:   . Difficulty of Paying Living Expenses: Not on file  Food Insecurity:   . Worried About Charity fundraiser in the Last Year: Not on file  . Ran  Out of Food in the Last Year: Not on file  Transportation Needs:   . Lack of Transportation (Medical): Not on file  . Lack of Transportation (Non-Medical): Not on file  Physical Activity:   . Days of Exercise per Week: Not on file  . Minutes of Exercise per Session: Not on file  Stress:   . Feeling of Stress : Not on file  Social Connections:   . Frequency of Communication with Friends and Family: Not on file  . Frequency of Social Gatherings with Friends and Family: Not on file  . Attends Religious Services: Not on file  . Active Member of Clubs or Organizations: Not on file  . Attends Archivist Meetings: Not on file  . Marital Status: Not on file     Family History: The patient's family history includes COPD in his father. ROS:   Please see the history of present illness.    All 14 point review of systems negative except as described per history of present illness  EKGs/Labs/Other Studies Reviewed:      Recent Labs: 05/23/2019: ALT 17 05/30/2019: BUN 42; Creatinine, Ser 4.04; Potassium 4.9; Sodium 137  Recent Lipid Panel    Component Value Date/Time   CHOL 154 05/23/2019 1144   TRIG 87.0 05/23/2019 1144   HDL 48.70 05/23/2019 1144   CHOLHDL 3 05/23/2019 1144   VLDL 17.4 05/23/2019 1144   LDLCALC 88 05/23/2019 1144    Physical Exam:    VS:  BP 128/74   Pulse 76   Ht 5\' 8"  (1.727 m)   Wt 179 lb (81.2 kg)   SpO2 97%   BMI 27.22 kg/m     Wt Readings from Last 3 Encounters:  06/03/19 179 lb (81.2 kg)  05/23/19 184 lb (83.5 kg)  02/21/19 157 lb (71.2 kg)     GEN:  Well nourished, well developed in no acute distress HEENT: Normal NECK: No JVD; No carotid bruits LYMPHATICS: No lymphadenopathy CARDIAC: RRR, no murmurs, no rubs, no gallops RESPIRATORY:  Clear to auscultation without rales, wheezing or rhonchi  ABDOMEN: Soft, non-tender, non-distended MUSCULOSKELETAL:  No edema; No deformity  SKIN: Warm and dry LOWER EXTREMITIES: 1+  swelling NEUROLOGIC:  Alert and oriented x 3 PSYCHIATRIC:  Normal affect   ASSESSMENT:    1. Chronic systolic congestive heart failure ([place])   2. Nonrheumatic mitral valve regurgitation   3. Dilated cardiomyopathy (H[facility])   4. Paroxysmal atrial fibrillation with RVR ([place])   5. Pulmonary hypertension, unspecified ([place])    PLAN:    In order of problems listed above:  1. Chronic systolic congestive heart failure New York Heart Association class III, diuretic is back on board with increased  creatinine right now to 4.04.  Obviously will watch this very carefully.  We talk about fluid management.  I explained to him that this is a very difficult situation that required careful monitoring.  From one side we want to make sure he does not have significant swelling of lower extremities but from the opposite side his kidney function is deteriorating.  I think the present dose of diuretic is sufficient.  He is swelling improved significantly.  His kidney function deteriorated.  In about a week or 2 we need to repeat his kidney function again. 2. Essential hypertension he did have a problem with hypotension.  However lately his blood pressure seems to be behaving appropriately on this decent level, therefore I will not reduce any of his medications.  There was some question about potentially discontinuation of Imdur.  However, Imdur was given for hopefully in improvement in left ventricle ejection fraction. 3. Dilated cardiomyopathy with ejection fraction 30 to 35% based on echocardiogram from last year.  Echocardiogram will be repeated.  We will continue with Imdur as before. 4. Pulmonary hypertension again echocardiogram will be repeated to recheck on that. 5. Paroxysmal atrial fibrillation, not anticoagulated because of fragile state.  Denies having any palpitations.  Regular today most likely maintaining sinus rhythm   Medication Adjustments/Labs and Tests Ordered: Current medicines are reviewed at  length with the patient today.  Concerns regarding medicines are outlined above.  No orders of the defined types were placed in this encounter.  Medication changes: No orders of the defined types were placed in this encounter.   Signed, Park Liter, MD, [facility] 06/03/2019 10:09 AM    [place]

## 2019-06-03 NOTE — Telephone Encounter (Signed)
Letter completed and included dates.   Faxed to number included in message

## 2019-06-03 NOTE — Addendum Note (Signed)
Addended by: Ashok Norris on: 06/03/2019 10:16 AM   Modules accepted: Orders

## 2019-06-03 NOTE — Patient Instructions (Signed)
Medication Instructions:  Your physician recommends that you continue on your current medications as directed. Please refer to the Current Medication list given to you today.  *If you need a refill on your cardiac medications before your next appointment, please call your pharmacy*  Lab Work: None.  If you have labs (blood work) drawn today and your tests are completely normal, you will receive your results only by: Marland Kitchen MyChart Message (if you have MyChart) OR . A paper copy in the mail If you have any lab test that is abnormal or we need to change your treatment, we will call you to review the results.  Testing/Procedures: Your physician has requested that you have an echocardiogram. Echocardiography is a painless test that uses sound waves to create images of your heart. It provides your doctor with information about the size and shape of your heart and how well your heart's chambers and valves are working. This procedure takes approximately one hour. There are no restrictions for this procedure.    Follow-Up: At [facility], you and your health needs are our priority.  As part of our continuing mission to provide you with exceptional heart care, we have created designated Provider Care Teams.  These Care Teams include your primary Cardiologist (physician) and Advanced Practice Providers (APPs -  Physician Assistants and Nurse Practitioners) who all work together to provide you with the care you need, when you need it.  Your next appointment:   2 month(s)  The format for your next appointment:   In Person  Provider:   You may see Jenne Campus, MD or the following Advanced Practice Provider on your designated Care Team:    Laurann Montana, FNP   Other Instructions   Echocardiogram An echocardiogram is a procedure that uses painless sound waves (ultrasound) to produce an image of the heart. Images from an echocardiogram can provide important information about:  Signs of  coronary artery disease (CAD).  Aneurysm detection. An aneurysm is a weak or damaged part of an artery wall that bulges out from the normal force of blood pumping through the body.  Heart size and shape. Changes in the size or shape of the heart can be associated with certain conditions, including heart failure, aneurysm, and CAD.  Heart muscle function.  Heart valve function.  Signs of a past heart attack.  Fluid buildup around the heart.  Thickening of the heart muscle.  A tumor or infectious growth around the heart valves. Tell a health care provider about:  Any allergies you have.  All medicines you are taking, including vitamins, herbs, eye drops, creams, and over-the-counter medicines.  Any blood disorders you have.  Any surgeries you have had.  Any medical conditions you have.  Whether you are pregnant or may be pregnant. What are the risks? Generally, this is a safe procedure. However, problems may occur, including:  Allergic reaction to dye (contrast) that may be used during the procedure. What happens before the procedure? No specific preparation is needed. You may eat and drink normally. What happens during the procedure?   An IV tube may be inserted into one of your veins.  You may receive contrast through this tube. A contrast is an injection that improves the quality of the pictures from your heart.  A gel will be applied to your chest.  A wand-like tool (transducer) will be moved over your chest. The gel will help to transmit the sound waves from the transducer.  The sound waves will harmlessly  bounce off of your heart to allow the heart images to be captured in real-time motion. The images will be recorded on a computer. The procedure may vary among health care providers and hospitals. What happens after the procedure?  You may return to your normal, everyday life, including diet, activities, and medicines, unless your health care provider tells you  not to do that. Summary  An echocardiogram is a procedure that uses painless sound waves (ultrasound) to produce an image of the heart.  Images from an echocardiogram can provide important information about the size and shape of your heart, heart muscle function, heart valve function, and fluid buildup around your heart.  You do not need to do anything to prepare before this procedure. You may eat and drink normally.  After the echocardiogram is completed, you may return to your normal, everyday life, unless your health care provider tells you not to do that. This information is not intended to replace advice given to you by your health care provider. Make sure you discuss any questions you have with your health care provider. Document Revised: 08/15/2018 Document Reviewed: 05/27/2016 Elsevier Patient Education  [place].

## 2019-06-03 NOTE — Telephone Encounter (Signed)
Pt dtr called stating the letter does not include the start and end date so her HR cannot accept. Please revise letter to be from 06/09/2019-10/20/2019.  Send to Attn: Virl Cagey, fax # [phone]

## 2019-06-05 DIAGNOSIS — C8331 Diffuse large B-cell lymphoma, lymph nodes of head, face, and neck: Secondary | ICD-10-CM | POA: Diagnosis not present

## 2019-06-05 DIAGNOSIS — D72819 Decreased white blood cell count, unspecified: Secondary | ICD-10-CM | POA: Diagnosis not present

## 2019-06-05 DIAGNOSIS — D696 Thrombocytopenia, unspecified: Secondary | ICD-10-CM | POA: Diagnosis not present

## 2019-06-05 DIAGNOSIS — M7989 Other specified soft tissue disorders: Secondary | ICD-10-CM | POA: Diagnosis not present

## 2019-06-06 ENCOUNTER — Ambulatory Visit (HOSPITAL_BASED_OUTPATIENT_CLINIC_OR_DEPARTMENT_OTHER)
Admission: RE | Admit: 2019-06-06 | Discharge: 2019-06-06 | Disposition: A | Discharge status: Home / Self Care | Payer: Medicare HMO | Source: Ambulatory Visit | Attending: Cardiology | Admitting: Cardiology

## 2019-06-06 ENCOUNTER — Other Ambulatory Visit: Payer: Self-pay

## 2019-06-06 DIAGNOSIS — I340 Nonrheumatic mitral (valve) insufficiency: Secondary | ICD-10-CM | POA: Diagnosis not present

## 2019-06-06 DIAGNOSIS — I420 Dilated cardiomyopathy: Secondary | ICD-10-CM | POA: Diagnosis not present

## 2019-06-06 DIAGNOSIS — I5022 Chronic systolic (congestive) heart failure: Secondary | ICD-10-CM | POA: Diagnosis not present

## 2019-06-06 NOTE — Progress Notes (Signed)
  Echocardiogram 2D Echocardiogram has been performed.  Cardell Peach 06/06/2019, 11:43 AM

## 2019-06-11 ENCOUNTER — Ambulatory Visit: Payer: Medicare HMO | Admitting: Family Medicine

## 2019-06-13 ENCOUNTER — Telehealth: Payer: Self-pay | Admitting: Emergency Medicine

## 2019-06-13 NOTE — Telephone Encounter (Signed)
Patient informed of results. He asked me to call [phone] to speak with his daughter to make a appointment. Left message for her to call back.

## 2019-06-16 ENCOUNTER — Other Ambulatory Visit: Payer: Self-pay | Admitting: Family Medicine

## 2019-06-16 ENCOUNTER — Other Ambulatory Visit: Payer: Self-pay | Admitting: *Deleted

## 2019-06-16 ENCOUNTER — Other Ambulatory Visit: Payer: Medicare HMO

## 2019-06-16 DIAGNOSIS — N289 Disorder of kidney and ureter, unspecified: Secondary | ICD-10-CM

## 2019-06-17 LAB — BASIC METABOLIC PANEL
BUN/Creatinine Ratio: 11 (ref 10–24)
BUN: 47 mg/dL — ABNORMAL HIGH (ref 8–27)
CO2: 24 mmol/L (ref 20–29)
Calcium: 9.7 mg/dL (ref 8.6–10.2)
Chloride: 98 mmol/L (ref 96–106)
Creatinine, Ser: 4.3 mg/dL — ABNORMAL HIGH (ref 0.76–1.27)
GFR calc Af Amer: 14 mL/min/{1.73_m2} — ABNORMAL LOW (ref >59)
GFR calc non Af Amer: 12 mL/min/{1.73_m2} — ABNORMAL LOW (ref >59)
Glucose: 73 mg/dL (ref 65–99)
Potassium: 5.1 mmol/L (ref 3.5–5.2)
Sodium: 138 mmol/L (ref 134–144)

## 2019-06-19 ENCOUNTER — Telehealth: Payer: Self-pay | Admitting: Cardiology

## 2019-06-19 NOTE — Telephone Encounter (Signed)
Spoke to daughter (ok per DPR)-aware of echo results and appt scheduled for 2/26 at 11AM with Dr. Raliegh Ip in [facility].

## 2019-06-19 NOTE — Telephone Encounter (Signed)
Patient's daughter states that she is returning Hayley's call in regards to echo results.

## 2019-06-20 NOTE — Progress Notes (Signed)
Thereasa Distance, MD is the nephrologist he was referred to. If they need a referral or never saw that provider, please place the referral. Ty.

## 2019-06-20 NOTE — Progress Notes (Signed)
Patient advised of results, he was not sure if he has seen a nephrologist or not and provided his daughter information so we can talk to her about his results.  Called daughter Areon Cocuzza at [phone] and results explained to her. She reports she has not taken patient to a nephrologist before.    I advised her a new referral will probably be entered. She asked her information to be on the referral so she can get the call for the appointment. Hassell Done [phone]

## 2019-06-20 NOTE — Progress Notes (Signed)
Per Dr.Lufadeju's office patient has been seen at this office twice since 01-23-2019 and has an up coming appointment on 07-07-2019. Labs faxed over to their office.  Lvm for patient's daughter do be aware of this and to make sure her father follows up as schedule.

## 2019-06-23 DIAGNOSIS — I5043 Acute on chronic combined systolic (congestive) and diastolic (congestive) heart failure: Secondary | ICD-10-CM | POA: Diagnosis not present

## 2019-06-23 DIAGNOSIS — J9621 Acute and chronic respiratory failure with hypoxia: Secondary | ICD-10-CM | POA: Diagnosis not present

## 2019-06-23 DIAGNOSIS — J449 Chronic obstructive pulmonary disease, unspecified: Secondary | ICD-10-CM | POA: Diagnosis not present

## 2019-06-23 DIAGNOSIS — R062 Wheezing: Secondary | ICD-10-CM | POA: Diagnosis not present

## 2019-07-04 ENCOUNTER — Telehealth (INDEPENDENT_AMBULATORY_CARE_PROVIDER_SITE_OTHER): Payer: Medicare HMO | Admitting: Cardiology

## 2019-07-04 ENCOUNTER — Encounter: Payer: Self-pay | Admitting: Cardiology

## 2019-07-04 ENCOUNTER — Other Ambulatory Visit: Payer: Self-pay

## 2019-07-04 DIAGNOSIS — R600 Localized edema: Secondary | ICD-10-CM | POA: Diagnosis not present

## 2019-07-04 DIAGNOSIS — N189 Chronic kidney disease, unspecified: Secondary | ICD-10-CM

## 2019-07-04 DIAGNOSIS — I5022 Chronic systolic (congestive) heart failure: Secondary | ICD-10-CM

## 2019-07-04 DIAGNOSIS — N179 Acute kidney failure, unspecified: Secondary | ICD-10-CM

## 2019-07-04 DIAGNOSIS — N289 Disorder of kidney and ureter, unspecified: Secondary | ICD-10-CM

## 2019-07-04 MED ORDER — HYDRALAZINE HCL 10 MG PO TABS: 10.0000 mg | ORAL_TABLET | Freq: Three times a day (TID) | ORAL | 1 refills | Status: DC

## 2019-07-04 NOTE — Patient Instructions (Signed)
Medication Instructions:  Your physician has recommended you make the following change in your medication:  START: Hydralazine 10 mg three times daily  *If you need a refill on your cardiac medications before your next appointment, please call your pharmacy*   Lab Work: None. If you have labs (blood work) drawn today and your tests are completely normal, you will receive your results only by: Marland Kitchen MyChart Message (if you have MyChart) OR . A paper copy in the mail If you have any lab test that is abnormal or we need to change your treatment, we will call you to review the results.   Testing/Procedures: None   Follow-Up: At [facility], you and your health needs are our priority.  As part of our continuing mission to provide you with exceptional heart care, we have created designated Provider Care Teams.  These Care Teams include your primary Cardiologist (physician) and Advanced Practice Providers (APPs -  Physician Assistants and Nurse Practitioners) who all work together to provide you with the care you need, when you need it.  We recommend signing up for the patient portal called "MyChart".  Sign up information is provided on this After Visit Summary.  MyChart is used to connect with patients for Virtual Visits (Telemedicine).  Patients are able to view lab/test results, encounter notes, upcoming appointments, etc.  Non-urgent messages can be sent to your provider as well.   To learn more about what you can do with MyChart, go to [email or web address].    Your next appointment:   1 month(s)  The format for your next appointment:   In Person  Provider:   Jenne Campus, MD   Other Instructions Hydralazine tablets What is this medicine? HYDRALAZINE (hye DRAL a zeen) is a type of vasodilator. It relaxes blood vessels, increasing the blood and oxygen supply to your heart. This medicine is used to treat high blood pressure. This medicine may be used for other purposes; ask  your health care provider or pharmacist if you have questions. COMMON BRAND NAME(S): Apresoline What should I tell my health care provider before I take this medicine? They need to know if you have any of these conditions:  blood vessel disease  heart disease including angina or history of heart attack  kidney or liver disease  systemic lupus erythematosus (SLE)  an unusual or allergic reaction to hydralazine, tartrazine dye, other medicines, foods, dyes, or preservatives  pregnant or trying to get pregnant  breast-feeding How should I use this medicine? Take this medicine by mouth with a glass of water. Follow the directions on the prescription label. Take your doses at regular intervals. Do not take your medicine more often than directed. Do not stop taking except on the advice of your doctor or health care professional. Talk to your pediatrician regarding the use of this medicine in children. Special care may be needed. While this drug may be prescribed for children for selected conditions, precautions do apply. Overdosage: If you think you have taken too much of this medicine contact a poison control center or emergency room at once. NOTE: This medicine is only for you. Do not share this medicine with others. What if I miss a dose? If you miss a dose, take it as soon as you can. If it is almost time for your next dose, take only that dose. Do not take double or extra doses. What may interact with this medicine?  medicines for high blood pressure  medicines for mental depression This list may  not describe all possible interactions. Give your health care provider a list of all the medicines, herbs, non-prescription drugs, or dietary supplements you use. Also tell them if you smoke, drink alcohol, or use illegal drugs. Some items may interact with your medicine. What should I watch for while using this medicine? Visit your doctor or health care professional for regular checks on your  progress. Check your blood pressure and pulse rate regularly. Ask your doctor or health care professional what your blood pressure and pulse rate should be and when you should contact him or her. You may get drowsy or dizzy. Do not drive, use machinery, or do anything that needs mental alertness until you know how this medicine affects you. Do not stand or sit up quickly, especially if you are an older patient. This reduces the risk of dizzy or fainting spells. Alcohol may interfere with the effect of this medicine. Avoid alcoholic drinks. Do not treat yourself for coughs, colds, or pain while you are taking this medicine without asking your doctor or health care professional for advice. Some ingredients may increase your blood pressure. What side effects may I notice from receiving this medicine? Side effects that you should report to your doctor or health care professional as soon as possible:  chest pain, or fast or irregular heartbeat  fever, chills, or sore throat  numbness or tingling in the hands or feet  shortness of breath  skin rash, redness, blisters or itching  stiff or swollen joints  sudden weight gain  swelling of the feet or legs  swollen lymph glands  unusual weakness Side effects that usually do not require medical attention (report to your doctor or health care professional if they continue or are bothersome):  diarrhea, or constipation  headache  loss of appetite  nausea, vomiting This list may not describe all possible side effects. Call your doctor for medical advice about side effects. You may report side effects to FDA at 1-800-FDA-1088. Where should I keep my medicine? Keep out of the reach of children. Store at room temperature between 15 and 30 degrees C (59 and 86 degrees F). Throw away any unused medicine after the expiration date. NOTE: This sheet is a summary. It may not cover all possible information. If you have questions about this medicine,  talk to your doctor, pharmacist, or health care provider.  2020 Elsevier/Gold Standard (2007-09-06 15:44:58)

## 2019-07-04 NOTE — Progress Notes (Signed)
Virtual Visit via Video Note   This visit type was conducted due to national recommendations for restrictions regarding the COVID-19 Pandemic (e.g. social distancing) in an effort to limit this patient's exposure and mitigate transmission in our community.  Due to his co-morbid illnesses, this patient is at least at moderate risk for complications without adequate follow up.  This format is felt to be most appropriate for this patient at this time.  All issues noted in this document were discussed and addressed.  A limited physical exam was performed with this format.  Please refer to the patient's chart for his consent to telehealth for [facility].  Evaluation Performed:  Follow-up visit  This visit type was conducted due to national recommendations for restrictions regarding the COVID-19 Pandemic (e.g. social distancing).  This format is felt to be most appropriate for this patient at this time.  All issues noted in this document were discussed and addressed.  No physical exam was performed (except for noted visual exam findings with Video Visits).  Please refer to the patient's chart (MyChart message for video visits and phone note for telephone visits) for the patient's consent to telehealth for [facility].  Date:  07/04/2019  ID: Eric Herring, DOB [birth year], MRN [id number]   Patient Location: [address] [place] [address]   Provider location:   [place] Office  PCP:  Shelda Pal, DO  Cardiologist:  Jenne Campus, MD     Chief Complaint: I am doing better  History of Present Illness:    Eric Herring is a 85 y.o. male  who presents via audio/video conferencing for a telehealth visit today.  With history of advanced kidney disease with average creatinine around 4, congestive heart failure related to cardiomyopathy with ejection fraction of less than estimation of 25 to 30%, diabetes mellitus, COPD, coronary artery disease.  He does  have a televisit with me today.  What prompted his visit is the fact that his echocardiogram showed worsening of his left ventricle ejection fraction.  Overall he said he is doing fine he does have some swelling of lower extremities.  Recently, he had some extra diuretic given to him with resulted in increase of creatinine.  Denies have any chest pain tightness squeezing pressure burning chest.  He try to walk around his house sometimes venture outside.  Denies having any proximal nocturnal dyspnea.   The patient does not have symptoms concerning for COVID-19 infection (fever, chills, cough, or new SHORTNESS OF BREATH).    Prior CV studies:   The following studies were reviewed today:  Echocardiogram done on 06 June 2019 showed: 1. Left ventricular ejection fraction, by visual estimation, is 25 to  30%. There is mildly increased left ventricular hypertrophy.  2. Left ventricular diastolic parameters are consistent with Grade II  diastolic dysfunction (pseudonormalization).  3. The left ventricle demonstrates global hypokinesis.  4. Global right ventricle has normal systolic function.The right  ventricular size is normal. No increase in right ventricular wall  thickness.  5. Left atrial size was mildly dilated.  6. Right atrial size was normal.  7. Trivial pericardial effusion is present.  8. The mitral valve is normal in structure. Moderate mitral valve  regurgitation. No evidence of mitral stenosis.  9. The tricuspid valve is normal in structure.  10. The tricuspid valve is normal in structure. Tricuspid valve  regurgitation is mild-moderate.  11. The aortic valve is normal in structure. Aortic valve regurgitation is  not visualized. Mild aortic valve sclerosis without stenosis.  12. The pulmonic valve was normal in structure. Pulmonic valve  regurgitation is not visualized.  13. Mildly elevated pulmonary artery systolic pressure.      Past Medical History:  Diagnosis  Date  . Allergy   . Aortic atherosclerosis ([place])   . Arthropathy   . CAD (coronary artery disease)   . Cardiomyopathy ([place])   . Chronic kidney disease   . Colon polyp   . COPD (chronic obstructive pulmonary disease) ([place])   . Diabetes ([place])   . Diabetic neuropathy ([place])   . Heart murmur   . Hypercholesterolemia   . Hypertension    pulmonary  . LVH (left ventricular hypertrophy)   . Lymphoma ([place])   . Mitral regurgitation   . Prostate cancer ([place])   . Pulmonary hypertension ([place])   . Tricuspid regurgitation     Past Surgical History:  Procedure Laterality Date  . HEMORROIDECTOMY     pt does not remember year     Current Meds  Medication Sig  . albuterol (PROVENTIL HFA;VENTOLIN HFA) 108 (90 Base) MCG/ACT inhaler Inhale 2 puffs into the lungs every 6 (six) hours as needed for wheezing or shortness of breath.  Marland Kitchen albuterol (PROVENTIL) (2.5 MG/3ML) 0.083% nebulizer solution USE 1 VIAL IN NEBULIZER EVERY 6 HOURS AS NEEDED FOR WHEEZING FOR SHORTNESS OF BREATH  . amiodarone (PACERONE) 200 MG tablet TAKE 1 TABLET EVERY DAY  . aspirin 81 MG tablet Take 81 mg by mouth daily.  . Calcium Carb-Cholecalciferol (CALCIUM-VITAMIN D) 500-200 MG-UNIT tablet Take 1 tablet by mouth daily.   . fexofenadine (ALLEGRA ALLERGY) 180 MG tablet Take 1 tablet (180 mg total) by mouth daily.  . Fluticasone-Umeclidin-Vilant (TRELEGY ELLIPTA) 100-62.5-25 MCG/INH AEPB Inhale 1 puff into the lungs daily.  . furosemide (LASIX) 40 MG tablet Take 1 tablet (40 mg total) by mouth daily.  . isosorbide mononitrate (IMDUR) 30 MG 24 hr tablet TAKE 1 TABLET EVERY DAY  . Multiple Vitamin (MULTIVITAMIN) tablet Take 1 tablet by mouth.   . potassium chloride (KLOR-CON) 10 MEQ tablet Take 2 tabs with every dose of Lasix (40 mg).  . pravastatin (PRAVACHOL) 20 MG tablet Take 1 tablet (20 mg total) by mouth daily.      Family History: The patient's family history includes COPD in his father.   ROS:   Please see the  history of present illness.     All other systems reviewed and are negative.   Labs/Other Tests and Data Reviewed:     Recent Labs: 05/23/2019: ALT 17 06/16/2019: BUN 47; Creatinine, Ser 4.30; Potassium 5.1; Sodium 138  Recent Lipid Panel    Component Value Date/Time   CHOL 154 05/23/2019 1144   TRIG 87.0 05/23/2019 1144   HDL 48.70 05/23/2019 1144   CHOLHDL 3 05/23/2019 1144   VLDL 17.4 05/23/2019 1144   LDLCALC 88 05/23/2019 1144      Exam:    Vital Signs:  There were no vitals taken for this visit.    Wt Readings from Last 3 Encounters:  06/03/19 179 lb (81.2 kg)  05/23/19 184 lb (83.5 kg)  02/21/19 157 lb (71.2 kg)     Well nourished, well developed in no acute distress. Alert awake and x3, we talk over the video link.  He seems to be not in any distress.  He is at home and I am on my home in [place].  Denies having any shortness of breath chest pain at the time  of my interview.  Diagnosis for this visit:   1. Chronic systolic congestive heart failure ([place])   2. Localized edema   3. Acute on chronic renal insufficiency      ASSESSMENT & PLAN:    1.  Chronic systolic congestive heart fluid cardiomyopathy with mild deterioration of his left ventricle ejection fraction.  The biggest obstacle his blood pressure being low as well as kidney dysfunction.  I will add hydralazine 10 mg 3 times daily to his medical regimen.  I will not change any of his medication otherwise.  Of course etiology of this phenomenon is unclear.  Coronary artery disease must be considered.  However, the best estimation of his coronary artery disease 3 will be cardiac catheterization which will lead undoubtedly to complete kidney failure.  I wanted him to think about it if you want to pursue that in the meantime we will try to manage this best we can medically. 2.  Paroxysmal atrial fibrillation suppressed with amiodarone.  Not anticoagulated because of fragile state. 3.  Swelling of lower  extremities.  Significant, reluctant to increase dose of diuretics because of kidney dysfunction.  Otherwise he seems to be doing okay 4.  Chronic kidney failure with last GFR of 14.  Continue monitoring.  He is followed by nephrology  COVID-19 Education: The signs and symptoms of COVID-19 were discussed with the patient and how to seek care for testing (follow up with PCP or arrange E-visit).  The importance of social distancing was discussed today.  Patient Risk:   After full review of this patients clinical status, I feel that they are at least moderate risk at this time.  Time:   Today, I have spent 5 minutes with the patient with telehealth technology discussing pt health issues.  I spent 15 minutes reviewing her chart before the visit.  Visit was finished at 11:30 AM.    Medication Adjustments/Labs and Tests Ordered: Current medicines are reviewed at length with the patient today.  Concerns regarding medicines are outlined above.  No orders of the defined types were placed in this encounter.  Medication changes: No orders of the defined types were placed in this encounter.    Disposition: Follow-up 1 month  Signed, Park Liter, MD, [facility] 07/04/2019 11:25 AM    [place]

## 2019-07-04 NOTE — Addendum Note (Signed)
Addended by: Ashok Norris on: 07/04/2019 11:44 AM   Modules accepted: Orders

## 2019-07-21 DIAGNOSIS — E889 Metabolic disorder, unspecified: Secondary | ICD-10-CM | POA: Diagnosis not present

## 2019-07-21 DIAGNOSIS — J9621 Acute and chronic respiratory failure with hypoxia: Secondary | ICD-10-CM | POA: Diagnosis not present

## 2019-07-21 DIAGNOSIS — N184 Chronic kidney disease, stage 4 (severe): Secondary | ICD-10-CM | POA: Diagnosis not present

## 2019-07-21 DIAGNOSIS — I1310 Hypertensive heart and chronic kidney disease without heart failure, with stage 1 through stage 4 chronic kidney disease, or unspecified chronic kidney disease: Secondary | ICD-10-CM | POA: Diagnosis not present

## 2019-07-21 DIAGNOSIS — R062 Wheezing: Secondary | ICD-10-CM | POA: Diagnosis not present

## 2019-07-21 DIAGNOSIS — N185 Chronic kidney disease, stage 5: Secondary | ICD-10-CM | POA: Diagnosis not present

## 2019-07-21 DIAGNOSIS — M9080 Osteopathy in diseases classified elsewhere, unspecified site: Secondary | ICD-10-CM | POA: Diagnosis not present

## 2019-07-21 DIAGNOSIS — I5043 Acute on chronic combined systolic (congestive) and diastolic (congestive) heart failure: Secondary | ICD-10-CM | POA: Diagnosis not present

## 2019-07-21 DIAGNOSIS — J449 Chronic obstructive pulmonary disease, unspecified: Secondary | ICD-10-CM | POA: Diagnosis not present

## 2019-08-01 ENCOUNTER — Ambulatory Visit: Payer: Medicare HMO | Admitting: Cardiology

## 2019-08-01 ENCOUNTER — Encounter: Payer: Self-pay | Admitting: Cardiology

## 2019-08-01 ENCOUNTER — Other Ambulatory Visit: Payer: Self-pay

## 2019-08-01 VITALS — BP 94/66 | HR 77 | Temp 97.3°F | Ht 68.0 in | Wt 164.2 lb

## 2019-08-01 DIAGNOSIS — I130 Hypertensive heart and chronic kidney disease with heart failure and stage 1 through stage 4 chronic kidney disease, or unspecified chronic kidney disease: Secondary | ICD-10-CM | POA: Diagnosis not present

## 2019-08-01 DIAGNOSIS — I420 Dilated cardiomyopathy: Secondary | ICD-10-CM | POA: Diagnosis not present

## 2019-08-01 DIAGNOSIS — N189 Chronic kidney disease, unspecified: Secondary | ICD-10-CM

## 2019-08-01 DIAGNOSIS — N289 Disorder of kidney and ureter, unspecified: Secondary | ICD-10-CM | POA: Diagnosis not present

## 2019-08-01 DIAGNOSIS — I5022 Chronic systolic (congestive) heart failure: Secondary | ICD-10-CM

## 2019-08-01 DIAGNOSIS — R600 Localized edema: Secondary | ICD-10-CM

## 2019-08-01 DIAGNOSIS — J449 Chronic obstructive pulmonary disease, unspecified: Secondary | ICD-10-CM | POA: Diagnosis not present

## 2019-08-01 NOTE — Progress Notes (Signed)
Bmp

## 2019-08-01 NOTE — Patient Instructions (Signed)

## 2019-08-01 NOTE — Progress Notes (Signed)
Cardiology Office Note:    Date:  08/01/2019   ID:  Eric Herring, DOB [birth year], MRN [id number]  PCP:  Shelda Pal, DO  Cardiologist:  Jenne Campus, MD    Referring MD: Shelda Pal*   No chief complaint on file. I am doing fine  History of Present Illness:    Eric Herring is a 85 y.o. male with past medical history significant for cardiomyopathy with ejection fraction of 25 to 30% based on last echocardiogram, chronic kidney failure with creatinine between 4 and 5, essential hypertension, COPD.  He comes today to my office for follow-up.  Overall he is doing fair.  He is very sedentary does have some swelling of lower extremities, use elastic stockings as well as diuretic and seems to be holding quite okay tired and exhausted.  Have difficulty walking around because of shortness of breath.  Denies having any chest pain tightness squeezing pressure burning chest.  Recently he was diagnosed with hypokalemia and his potassium has been withdrawn.  I will check his Chem-7 today.  Past Medical History:  Diagnosis Date  . Allergy   . Aortic atherosclerosis ([place])   . Arthropathy   . CAD (coronary artery disease)   . Cardiomyopathy ([place])   . Chronic kidney disease   . Colon polyp   . COPD (chronic obstructive pulmonary disease) ([place])   . Diabetes ([place])   . Diabetic neuropathy ([place])   . Heart murmur   . Hypercholesterolemia   . Hypertension    pulmonary  . LVH (left ventricular hypertrophy)   . Lymphoma ([place])   . Mitral regurgitation   . Prostate cancer ([place])   . Pulmonary hypertension ([place])   . Tricuspid regurgitation     Past Surgical History:  Procedure Laterality Date  . HEMORROIDECTOMY     pt does not remember year    Current Medications: Current Meds  Medication Sig  . albuterol (PROVENTIL HFA;VENTOLIN HFA) 108 (90 Base) MCG/ACT inhaler Inhale 2 puffs into the lungs every 6 (six) hours as needed for wheezing or shortness of breath.  Marland Kitchen  albuterol (PROVENTIL) (2.5 MG/3ML) 0.083% nebulizer solution USE 1 VIAL IN NEBULIZER EVERY 6 HOURS AS NEEDED FOR WHEEZING FOR SHORTNESS OF BREATH  . amiodarone (PACERONE) 200 MG tablet TAKE 1 TABLET EVERY DAY  . aspirin 81 MG tablet Take 81 mg by mouth daily.  . Calcium Carb-Cholecalciferol (CALCIUM-VITAMIN D) 500-200 MG-UNIT tablet Take 1 tablet by mouth daily.   . fexofenadine (ALLEGRA ALLERGY) 180 MG tablet Take 1 tablet (180 mg total) by mouth daily.  . Fluticasone-Umeclidin-Vilant (TRELEGY ELLIPTA) 100-62.5-25 MCG/INH AEPB Inhale 1 puff into the lungs daily.  . furosemide (LASIX) 40 MG tablet Take 1 tablet (40 mg total) by mouth daily.  . hydrALAZINE (APRESOLINE) 10 MG tablet Take 1 tablet (10 mg total) by mouth 3 (three) times daily.  . isosorbide mononitrate (IMDUR) 30 MG 24 hr tablet TAKE 1 TABLET EVERY DAY  . Multiple Vitamin (MULTIVITAMIN) tablet Take 1 tablet by mouth.   . pravastatin (PRAVACHOL) 20 MG tablet Take 1 tablet (20 mg total) by mouth daily.     Allergies:   Lisinopril   Social History   Socioeconomic History  . Marital status: Married    Spouse name: Not on file  . Number of children: Not on file  . Years of education: Not on file  . Highest education level: Not on file  Occupational History  . Not on file  Tobacco Use  .  Smoking status: Former Smoker    Packs/day: 1.00    Years: 25.00    Pack years: 25.00    Types: Cigarettes    Quit date: 11/30/2007    Years since quitting: 11.6  . Smokeless tobacco: Never Used  Substance and Sexual Activity  . Alcohol use: No  . Drug use: No  . Sexual activity: Not on file  Other Topics Concern  . Not on file  Social History Narrative  . Not on file   Social Determinants of Health   Financial Resource Strain:   . Difficulty of Paying Living Expenses:   Food Insecurity:   . Worried About Charity fundraiser in the Last Year:   . Arboriculturist in the Last Year:   Transportation Needs:   . Lexicographer (Medical):   Marland Kitchen Lack of Transportation (Non-Medical):   Physical Activity:   . Days of Exercise per Week:   . Minutes of Exercise per Session:   Stress:   . Feeling of Stress :   Social Connections:   . Frequency of Communication with Friends and Family:   . Frequency of Social Gatherings with Friends and Family:   . Attends Religious Services:   . Active Member of Clubs or Organizations:   . Attends Archivist Meetings:   Marland Kitchen Marital Status:      Family History: The patient's family history includes COPD in his father. ROS:   Please see the history of present illness.    All 14 point review of systems negative except as described per history of present illness  EKGs/Labs/Other Studies Reviewed:    K PN reviewed.  Showing LDL of 88 and HDL of 48.  We will continue present management.  Triglycerides were 87.  His creatinine was 4.3 that was in June 16, 2019.  TSH was normal but this in 2019  Recent Labs: 05/23/2019: ALT 17 06/16/2019: BUN 47; Creatinine, Ser 4.30; Potassium 5.1; Sodium 138  Recent Lipid Panel    Component Value Date/Time   CHOL 154 05/23/2019 1144   TRIG 87.0 05/23/2019 1144   HDL 48.70 05/23/2019 1144   CHOLHDL 3 05/23/2019 1144   VLDL 17.4 05/23/2019 1144   LDLCALC 88 05/23/2019 1144    Physical Exam:    VS:  BP 94/66   Pulse 77   Temp (!) 97.3 F (36.3 C) (Temporal)   Ht 5\' 8"  (1.727 m)   Wt 164 lb 4 oz (74.5 kg)   SpO2 99%   BMI 24.97 kg/m     Wt Readings from Last 3 Encounters:  08/01/19 164 lb 4 oz (74.5 kg)  06/03/19 179 lb (81.2 kg)  05/23/19 184 lb (83.5 kg)     GEN:  Well nourished, well developed in no acute distress HEENT: Normal NECK: No JVD; No carotid bruits LYMPHATICS: No lymphadenopathy CARDIAC: RRR, no murmurs, no rubs, no gallops RESPIRATORY:  Clear to auscultation without rales, wheezing or rhonchi  ABDOMEN: Soft, non-tender, non-distended MUSCULOSKELETAL:  No edema; No deformity  SKIN: Warm  and dry LOWER EXTREMITIES: 1+ NEUROLOGIC:  Alert and oriented x 3 PSYCHIATRIC:  Normal affect   ASSESSMENT:    1. Chronic systolic congestive heart failure ([place])   2. Dilated cardiomyopathy ([place])   3. Acute on chronic renal insufficiency   4. Localized edema    PLAN:    In order of problems listed above:  1. Chronic systolic congestive heart failure.  The biggest limiting factor have is  obviously his blood pressure being low.  He is already on maximum tolerated guideline directed medical therapy.  But I am afraid we are reaching the limit how much we can do with medications alone.  He does not want to have any aggressive measures.  We did talk also about dialysis today and he said he does not want to have it.  Therefore, conservative care is in order. 2. Dilated cardiomyopathy on maximum medical therapy which I will continue. 3. Renal insufficiency we will check his Chem-7 today mostly for potassium. 4. Swelling of lower extremities on diuretics which I will continue.  Still some present but acceptable.   Medication Adjustments/Labs and Tests Ordered: Current medicines are reviewed at length with the patient today.  Concerns regarding medicines are outlined above.  Orders Placed This Encounter  Procedures  . Basic metabolic panel   Medication changes: No orders of the defined types were placed in this encounter.   Signed, Park Liter, MD, [facility] 08/01/2019 10:07 AM    [place]

## 2019-08-02 LAB — BASIC METABOLIC PANEL
BUN/Creatinine Ratio: 10 (ref 10–24)
BUN: 49 mg/dL — ABNORMAL HIGH (ref 8–27)
CO2: 22 mmol/L (ref 20–29)
Calcium: 10 mg/dL (ref 8.6–10.2)
Chloride: 100 mmol/L (ref 96–106)
Creatinine, Ser: 4.74 mg/dL — ABNORMAL HIGH (ref 0.76–1.27)
GFR calc Af Amer: 12 mL/min/{1.73_m2} — ABNORMAL LOW (ref >59)
GFR calc non Af Amer: 11 mL/min/{1.73_m2} — ABNORMAL LOW (ref >59)
Glucose: 67 mg/dL (ref 65–99)
Potassium: 4.6 mmol/L (ref 3.5–5.2)
Sodium: 137 mmol/L (ref 134–144)

## 2019-08-05 ENCOUNTER — Telehealth: Payer: Self-pay | Admitting: Cardiology

## 2019-08-05 NOTE — Telephone Encounter (Signed)
I spoke with patient's daughter and reviewed BMP results with her.

## 2019-08-05 NOTE — Telephone Encounter (Signed)
   Pt's daughter called, she said pt received call about lab results and she has some questions.  Please call

## 2019-08-06 ENCOUNTER — Telehealth: Payer: Self-pay | Admitting: Family Medicine

## 2019-08-06 MED ORDER — AMIODARONE HCL 200 MG PO TABS: 200.0000 mg | ORAL_TABLET | Freq: Every day | ORAL | 0 refills | Status: DC

## 2019-08-06 NOTE — Telephone Encounter (Signed)
Medication:amiodarone (PACERONE) 200 MG tablet [[id number]]     Has the patient contacted their pharmacy? No. (If no, request that the patient contact the pharmacy for the refill.) (If yes, when and what did the pharmacy advise?)  Preferred Pharmacy (with phone number or street name):  [facility] Phone:  [phone]  Fax:  [phone]       Agent: Please be advised that RX refills may take up to 3 business days. We ask that you follow-up with your pharmacy.

## 2019-08-21 ENCOUNTER — Other Ambulatory Visit: Payer: Self-pay | Admitting: Cardiology

## 2019-08-21 ENCOUNTER — Other Ambulatory Visit: Payer: Self-pay | Admitting: Family Medicine

## 2019-08-21 DIAGNOSIS — J449 Chronic obstructive pulmonary disease, unspecified: Secondary | ICD-10-CM | POA: Diagnosis not present

## 2019-08-21 DIAGNOSIS — R062 Wheezing: Secondary | ICD-10-CM | POA: Diagnosis not present

## 2019-08-21 DIAGNOSIS — I5043 Acute on chronic combined systolic (congestive) and diastolic (congestive) heart failure: Secondary | ICD-10-CM | POA: Diagnosis not present

## 2019-08-21 DIAGNOSIS — J9621 Acute and chronic respiratory failure with hypoxia: Secondary | ICD-10-CM | POA: Diagnosis not present

## 2019-09-20 DIAGNOSIS — J449 Chronic obstructive pulmonary disease, unspecified: Secondary | ICD-10-CM | POA: Diagnosis not present

## 2019-09-20 DIAGNOSIS — R062 Wheezing: Secondary | ICD-10-CM | POA: Diagnosis not present

## 2019-09-20 DIAGNOSIS — J9621 Acute and chronic respiratory failure with hypoxia: Secondary | ICD-10-CM | POA: Diagnosis not present

## 2019-09-20 DIAGNOSIS — I5043 Acute on chronic combined systolic (congestive) and diastolic (congestive) heart failure: Secondary | ICD-10-CM | POA: Diagnosis not present

## 2019-10-02 DIAGNOSIS — C8331 Diffuse large B-cell lymphoma, lymph nodes of head, face, and neck: Secondary | ICD-10-CM | POA: Diagnosis not present

## 2019-10-02 DIAGNOSIS — D709 Neutropenia, unspecified: Secondary | ICD-10-CM | POA: Diagnosis not present

## 2019-10-02 DIAGNOSIS — R609 Edema, unspecified: Secondary | ICD-10-CM | POA: Diagnosis not present

## 2019-10-02 DIAGNOSIS — D696 Thrombocytopenia, unspecified: Secondary | ICD-10-CM | POA: Diagnosis not present

## 2019-10-02 DIAGNOSIS — Z79899 Other long term (current) drug therapy: Secondary | ICD-10-CM | POA: Diagnosis not present

## 2019-10-08 ENCOUNTER — Encounter: Payer: Self-pay | Admitting: Cardiology

## 2019-10-21 DIAGNOSIS — J449 Chronic obstructive pulmonary disease, unspecified: Secondary | ICD-10-CM | POA: Diagnosis not present

## 2019-10-21 DIAGNOSIS — J9621 Acute and chronic respiratory failure with hypoxia: Secondary | ICD-10-CM | POA: Diagnosis not present

## 2019-10-21 DIAGNOSIS — R062 Wheezing: Secondary | ICD-10-CM | POA: Diagnosis not present

## 2019-10-21 DIAGNOSIS — I5043 Acute on chronic combined systolic (congestive) and diastolic (congestive) heart failure: Secondary | ICD-10-CM | POA: Diagnosis not present

## 2019-11-11 ENCOUNTER — Ambulatory Visit: Payer: Medicare HMO | Admitting: Cardiology

## 2019-11-12 ENCOUNTER — Other Ambulatory Visit: Payer: Self-pay

## 2019-11-12 ENCOUNTER — Encounter: Payer: Self-pay | Admitting: Cardiology

## 2019-11-12 ENCOUNTER — Ambulatory Visit: Payer: Medicare HMO | Admitting: Cardiology

## 2019-11-12 VITALS — BP 111/75 | HR 69 | Ht 68.0 in | Wt 168.4 lb

## 2019-11-12 DIAGNOSIS — R600 Localized edema: Secondary | ICD-10-CM | POA: Diagnosis not present

## 2019-11-12 DIAGNOSIS — N189 Chronic kidney disease, unspecified: Secondary | ICD-10-CM | POA: Insufficient documentation

## 2019-11-12 DIAGNOSIS — I5022 Chronic systolic (congestive) heart failure: Secondary | ICD-10-CM

## 2019-11-12 DIAGNOSIS — I420 Dilated cardiomyopathy: Secondary | ICD-10-CM

## 2019-11-12 HISTORY — DX: Chronic kidney disease, unspecified: N18.9

## 2019-11-12 NOTE — Patient Instructions (Signed)
Medication Instructions:  Your physician recommends that you continue on your current medications as directed. Please refer to the Current Medication list given to you today.  *If you need a refill on your cardiac medications before your next appointment, please call your pharmacy*   Lab Work: TODAY: BMET If you have labs (blood work) drawn today and your tests are completely normal, you will receive your results only by: Marland Kitchen MyChart Message (if you have MyChart) OR . A paper copy in the mail If you have any lab test that is abnormal or we need to change your treatment, we will call you to review the results.   Testing/Procedures: NONE   Follow-Up: At [facility], you and your health needs are our priority.  As part of our continuing mission to provide you with exceptional heart care, we have created designated Provider Care Teams.  These Care Teams include your primary Cardiologist (physician) and Advanced Practice Providers (APPs -  Physician Assistants and Nurse Practitioners) who all work together to provide you with the care you need, when you need it.  We recommend signing up for the patient portal called "MyChart".  Sign up information is provided on this After Visit Summary.  MyChart is used to connect with patients for Virtual Visits (Telemedicine).  Patients are able to view lab/test results, encounter notes, upcoming appointments, etc.  Non-urgent messages can be sent to your provider as well.   To learn more about what you can do with MyChart, go to [email or web address].    Your next appointment:   3 month(s)  The format for your next appointment:   In Person  Provider:   Jenne Campus, MD

## 2019-11-12 NOTE — Progress Notes (Signed)
Cardiology Office Note:    Date:  11/12/2019   ID:  Eric Herring, DOB [birth year], MRN [id number]  PCP:  Shelda Pal, DO  Cardiologist:  Jenne Campus, MD    Referring MD: Shelda Pal*   No chief complaint on file. I am doing fine  History of Present Illness:    Eric Herring is a 85 y.o. male with cardiomyopathy ejection fraction 20 to 25%, essential hypertension, diabetes, chronic renal failure with creatinine -4-5.  He does not want to be dialyzed.  He comes today to my office for follow-up.  Initially he said he is doing well but I clearly see that this is not the case.  He does have significant swelling of lower extremities also described to have paroxysmal nocturnal dyspnea.  He spent majority of day just sitting at home sometimes he goes outside early morning or late at night but is still not too high denies having any chest pain, tightness, pressure, burning in the chest.  Past Medical History:  Diagnosis Date   Allergy    Aortic atherosclerosis (HCC)    Arthropathy    CAD (coronary artery disease)    Cardiomyopathy (HCC)    Chronic kidney disease    Colon polyp    COPD (chronic obstructive pulmonary disease) (HCC)    Diabetes ([place])    Diabetic neuropathy ([place])    Heart murmur    Hypercholesterolemia    Hypertension    pulmonary   LVH (left ventricular hypertrophy)    Lymphoma (HCC)    Mitral regurgitation    Prostate cancer ([place])    Pulmonary hypertension (HCC)    Tricuspid regurgitation     Past Surgical History:  Procedure Laterality Date   HEMORROIDECTOMY     pt does not remember year    Current Medications: Current Meds  Medication Sig   albuterol (PROVENTIL HFA;VENTOLIN HFA) 108 (90 Base) MCG/ACT inhaler Inhale 2 puffs into the lungs every 6 (six) hours as needed for wheezing or shortness of breath.   albuterol (PROVENTIL) (2.5 MG/3ML) 0.083% nebulizer solution USE 1 VIAL IN NEBULIZER EVERY 6 HOURS AS  NEEDED FOR WHEEZING FOR SHORTNESS OF BREATH   amiodarone (PACERONE) 200 MG tablet TAKE 1 TABLET EVERY DAY   aspirin 81 MG tablet Take 81 mg by mouth daily.   Calcium Carb-Cholecalciferol (CALCIUM-VITAMIN D) 500-200 MG-UNIT tablet Take 1 tablet by mouth daily.    fexofenadine (ALLEGRA ALLERGY) 180 MG tablet Take 1 tablet (180 mg total) by mouth daily.   Fluticasone-Umeclidin-Vilant (TRELEGY ELLIPTA) 100-62.5-25 MCG/INH AEPB Inhale 1 puff into the lungs daily.   furosemide (LASIX) 40 MG tablet Take 1 tablet (40 mg total) by mouth daily.   isosorbide mononitrate (IMDUR) 30 MG 24 hr tablet TAKE 1 TABLET EVERY DAY   Multiple Vitamin (MULTIVITAMIN) tablet Take 1 tablet by mouth.    pravastatin (PRAVACHOL) 20 MG tablet Take 1 tablet (20 mg total) by mouth daily.     Allergies:   Lisinopril   Social History   Socioeconomic History   Marital status: Married    Spouse name: Not on file   Number of children: Not on file   Years of education: Not on file   Highest education level: Not on file  Occupational History   Not on file  Tobacco Use   Smoking status: Former Smoker    Packs/day: 1.00    Years: 25.00    Pack years: 25.00    Types: Cigarettes    Quit date:  11/30/2007    Years since quitting: 11.9   Smokeless tobacco: Never Used  Vaping Use   Vaping Use: Never used  Substance and Sexual Activity   Alcohol use: No   Drug use: No   Sexual activity: Not on file  Other Topics Concern   Not on file  Social History Narrative   Not on file   Social Determinants of Health   Financial Resource Strain:    Difficulty of Paying Living Expenses:   Food Insecurity:    Worried About Charity fundraiser in the Last Year:    Arboriculturist in the Last Year:   Transportation Needs:    Film/video editor (Medical):    Lack of Transportation (Non-Medical):   Physical Activity:    Days of Exercise per Week:    Minutes of Exercise per Session:   Stress:     Feeling of Stress :   Social Connections:    Frequency of Communication with Friends and Family:    Frequency of Social Gatherings with Friends and Family:    Attends Religious Services:    Active Member of Clubs or Organizations:    Attends Archivist Meetings:    Marital Status:      Family History: The patient's family history includes COPD in his father. ROS:   Please see the history of present illness.    All 14 point review of systems negative except as described per history of present illness  EKGs/Labs/Other Studies Reviewed:      Recent Labs: 05/23/2019: ALT 17 08/01/2019: BUN 49; Creatinine, Ser 4.74; Potassium 4.6; Sodium 137  Recent Lipid Panel    Component Value Date/Time   CHOL 154 05/23/2019 1144   TRIG 87.0 05/23/2019 1144   HDL 48.70 05/23/2019 1144   CHOLHDL 3 05/23/2019 1144   VLDL 17.4 05/23/2019 1144   LDLCALC 88 05/23/2019 1144    Physical Exam:    VS:  BP 111/75 (BP Location: Right Arm, Patient Position: Sitting, Cuff Size: Normal)    Pulse 69    Ht 5\' 8"  (1.727 m)    Wt 168 lb 6.4 oz (76.4 kg)    BMI 25.61 kg/m     Wt Readings from Last 3 Encounters:  11/12/19 168 lb 6.4 oz (76.4 kg)  08/01/19 164 lb 4 oz (74.5 kg)  06/03/19 179 lb (81.2 kg)     GEN:  Well nourished, well developed in no acute distress HEENT: Normal NECK: Elevated; No carotid bruits LYMPHATICS: No lymphadenopathy CARDIAC: RRR, no murmurs, no rubs, no gallops RESPIRATORY:  Clear to auscultation without rales, wheezing or rhonchi  ABDOMEN: Soft, non-tender, non-distended MUSCULOSKELETAL:  No edema; No deformity  SKIN: Warm and dry LOWER EXTREMITIES: 3+ swelling NEUROLOGIC:  Alert and oriented x 3 PSYCHIATRIC:  Normal affect   ASSESSMENT:    1. Dilated cardiomyopathy ([place])   2. Chronic systolic congestive heart failure ([place])   3. Localized edema    PLAN:    In order of problems listed above:  1. Dilated cardiomyopathy: On very limited  medication since his blood pressure is very soft and we have limitation terms of what I can do for him.  His blood pressure today actually is quite good 111/75.  I will not change any of his medications I will ask him to have Chem-7 done to check kidney function and determine if there is any room to give him a little bit more diuretic.  I asked him to be careful  with excessive fluids.  I asked him to leave his legs anytime he can to prevent severe swelling from happening.  Overall it is quite desperate situation.  Ideally he need to be dialyzed but he does not want to. 2. Congestive heart failure which is systolic New York Heart Association 3, only on Lasix Apresoline as well as Imdur because of kidney dysfunction.  Very limited option left.  I will ask him to have Chem-7 done and based on that decide if he can augment his therapy. 3. Localized edema will be more careful with the fluid, ask him to keep his legs elevated.  We will did talk about elastic stockings. 4. Kidney failure which is advanced.  He does not want to have dialysis.  We will check his Chem-7 5. Dyslipidemia I did review his K PN I do have data from May 23, 2019 with his LDL was 88 HDL 48.  He is taking pravastatin 20 mg and I will continue that.  The role of statin and the sign of clinical status is unknown.   Medication Adjustments/Labs and Tests Ordered: Current medicines are reviewed at length with the patient today.  Concerns regarding medicines are outlined above.  Orders Placed This Encounter  Procedures   Basic metabolic panel   Medication changes: No orders of the defined types were placed in this encounter.   Signed, Park Liter, MD, [facility] 11/12/2019 1:35 PM    [facility]

## 2019-11-13 LAB — BASIC METABOLIC PANEL
BUN/Creatinine Ratio: 10 (ref 10–24)
BUN: 38 mg/dL — ABNORMAL HIGH (ref 8–27)
CO2: 22 mmol/L (ref 20–29)
Calcium: 9.6 mg/dL (ref 8.6–10.2)
Chloride: 102 mmol/L (ref 96–106)
Creatinine, Ser: 3.88 mg/dL — ABNORMAL HIGH (ref 0.76–1.27)
GFR calc Af Amer: 16 mL/min/{1.73_m2} — ABNORMAL LOW (ref >59)
GFR calc non Af Amer: 13 mL/min/{1.73_m2} — ABNORMAL LOW (ref >59)
Glucose: 87 mg/dL (ref 65–99)
Potassium: 4.3 mmol/L (ref 3.5–5.2)
Sodium: 141 mmol/L (ref 134–144)

## 2019-11-18 ENCOUNTER — Other Ambulatory Visit: Payer: Self-pay | Admitting: Cardiology

## 2019-11-18 ENCOUNTER — Other Ambulatory Visit: Payer: Self-pay | Admitting: Family Medicine

## 2019-11-19 DIAGNOSIS — I5022 Chronic systolic (congestive) heart failure: Secondary | ICD-10-CM | POA: Diagnosis not present

## 2019-11-19 DIAGNOSIS — Z6826 Body mass index (BMI) 26.0-26.9, adult: Secondary | ICD-10-CM | POA: Diagnosis not present

## 2019-11-19 DIAGNOSIS — E261 Secondary hyperaldosteronism: Secondary | ICD-10-CM | POA: Diagnosis not present

## 2019-11-19 DIAGNOSIS — I25118 Atherosclerotic heart disease of native coronary artery with other forms of angina pectoris: Secondary | ICD-10-CM | POA: Diagnosis not present

## 2019-11-19 DIAGNOSIS — Z951 Presence of aortocoronary bypass graft: Secondary | ICD-10-CM | POA: Diagnosis not present

## 2019-11-19 DIAGNOSIS — Z7982 Long term (current) use of aspirin: Secondary | ICD-10-CM | POA: Diagnosis not present

## 2019-11-19 DIAGNOSIS — I700 Atherosclerosis of aorta: Secondary | ICD-10-CM | POA: Diagnosis not present

## 2019-11-19 DIAGNOSIS — M4690 Unspecified inflammatory spondylopathy, site unspecified: Secondary | ICD-10-CM | POA: Diagnosis not present

## 2019-11-20 DIAGNOSIS — I5043 Acute on chronic combined systolic (congestive) and diastolic (congestive) heart failure: Secondary | ICD-10-CM | POA: Diagnosis not present

## 2019-11-20 DIAGNOSIS — R062 Wheezing: Secondary | ICD-10-CM | POA: Diagnosis not present

## 2019-11-20 DIAGNOSIS — J449 Chronic obstructive pulmonary disease, unspecified: Secondary | ICD-10-CM | POA: Diagnosis not present

## 2019-11-20 DIAGNOSIS — J9621 Acute and chronic respiratory failure with hypoxia: Secondary | ICD-10-CM | POA: Diagnosis not present

## 2019-11-21 ENCOUNTER — Ambulatory Visit (INDEPENDENT_AMBULATORY_CARE_PROVIDER_SITE_OTHER): Payer: Medicare HMO | Admitting: Family Medicine

## 2019-11-21 ENCOUNTER — Other Ambulatory Visit: Payer: Self-pay

## 2019-11-21 ENCOUNTER — Encounter: Payer: Self-pay | Admitting: Family Medicine

## 2019-11-21 VITALS — BP 120/68 | HR 55 | Temp 97.6°F | Ht 70.0 in | Wt 172.0 lb

## 2019-11-21 DIAGNOSIS — M7989 Other specified soft tissue disorders: Secondary | ICD-10-CM | POA: Diagnosis not present

## 2019-11-21 DIAGNOSIS — E1149 Type 2 diabetes mellitus with other diabetic neurological complication: Secondary | ICD-10-CM | POA: Diagnosis not present

## 2019-11-21 NOTE — Progress Notes (Signed)
Subjective:   Chief Complaint  Patient presents with  . Follow-up    Eric Herring is a 85 y.o. male here for follow-up of diabetes.  Here w daughter.  Eric Herring does not monitor sugars at home. Patient does not require insulin.   Medications include: Diet controlled Diet is fair.   Exercise: Elliptical machine   +LE swelling, no pain. He is breathing normally. Does not eat salt. Does not wear compression stockings. Activity as above. No calf pain. He tries to elevate legs. +hx of heart dz and renal failure.  Past Medical History:  Diagnosis Date  . Allergy   . Aortic atherosclerosis ([place])   . Arthropathy   . CAD (coronary artery disease)   . Cardiomyopathy ([place])   . Chronic kidney disease   . Colon polyp   . COPD (chronic obstructive pulmonary disease) ([place])   . Diabetes ([place])   . Diabetic neuropathy ([place])   . Heart murmur   . Hypercholesterolemia   . Hypertension    pulmonary  . LVH (left ventricular hypertrophy)   . Lymphoma ([place])   . Mitral regurgitation   . Prostate cancer ([place])   . Pulmonary hypertension ([place])   . Tricuspid regurgitation      Related testing: Date of retinal exam: Due Pneumovax: done  Objective:  BP 120/68 (BP Location: Left Arm, Patient Position: Sitting, Cuff Size: Normal)   Pulse (!) 55   Temp 97.6 F (36.4 C) (Oral)   Ht 5\' 10"  (1.778 m)   Wt 172 lb (78 kg)   SpO2 100%   BMI 24.68 kg/m  General:  Well developed, well nourished, in no apparent distress Skin:  Warm, no pallor or diaphoresis Head:  Normocephalic, atraumatic Eyes:  Pupils equal and round, sclera anicteric without injection  Lungs:  CTAB, no access msc use Cardio:  RRR, no bruits, 3+ pitting LE edema b/l tapering at knees Psych: Nml mood and affect  Assessment:   Type 2 diabetes mellitus with neurological complications (HCC) - Plan: Hemoglobin A1c  Localized swelling of both lower extremities - Plan: Basic metabolic panel   Plan:   1. Ck A1c. Needs eye exam,  to call. Counseled on diet and exercise. Does not need to monitor sugars at home unless he wishes. 2. Swelling much more pronounced than last time I saw him. Reports compliance w Lasix. Will ck labs and see if we can change from qd to bid dosing. Elevate legs, stay active, mind salt. He does not want to wear compression stockings due to difficulty of getting them on and the hot weather.  F/u in 6 mo for CPE pending above. The patient and his daughter voiced understanding and agreement to the plan.  [place], DO 11/21/19 11:31 AM

## 2019-11-21 NOTE — Patient Instructions (Signed)
Give [place] 2-3 business days to get the results of your labs back.   If labs are normal, we will have you take your water pill (Lasix) twice daily.  Call your eye provider for your yearly appointment.  Don't drink water within 90 minutes of bedtime.  Keep the diet clean and stay active.  For the swelling in your lower extremities, be sure to elevate your legs when able, mind the salt intake, stay physically active and consider wearing compression stockings.  Let us know if you need anything.

## 2019-11-21 NOTE — Addendum Note (Signed)
Addended by: Caffie Pinto on: 11/21/2019 03:47 PM   Modules accepted: Orders

## 2019-11-27 ENCOUNTER — Other Ambulatory Visit (INDEPENDENT_AMBULATORY_CARE_PROVIDER_SITE_OTHER): Payer: Medicare HMO

## 2019-11-27 ENCOUNTER — Other Ambulatory Visit: Payer: Self-pay

## 2019-11-27 DIAGNOSIS — E1149 Type 2 diabetes mellitus with other diabetic neurological complication: Secondary | ICD-10-CM | POA: Diagnosis not present

## 2019-11-27 DIAGNOSIS — M7989 Other specified soft tissue disorders: Secondary | ICD-10-CM

## 2019-11-27 LAB — BASIC METABOLIC PANEL
BUN: 39 mg/dL — ABNORMAL HIGH (ref 6–23)
CO2: 26 mEq/L (ref 19–32)
Calcium: 9.4 mg/dL (ref 8.4–10.5)
Chloride: 102 mEq/L (ref 96–112)
Creatinine, Ser: 3.63 mg/dL — ABNORMAL HIGH (ref 0.40–1.50)
GFR: 19.49 mL/min — ABNORMAL LOW (ref >60.00)
Glucose, Bld: 99 mg/dL (ref 70–99)
Potassium: 5 mEq/L (ref 3.5–5.1)
Sodium: 135 mEq/L (ref 135–145)

## 2019-11-27 LAB — HEMOGLOBIN A1C: Hgb A1c MFr Bld: 5.8 % (ref 4.6–6.5)

## 2019-12-12 DIAGNOSIS — E8770 Fluid overload, unspecified: Secondary | ICD-10-CM

## 2019-12-12 DIAGNOSIS — M9080 Osteopathy in diseases classified elsewhere, unspecified site: Secondary | ICD-10-CM | POA: Diagnosis not present

## 2019-12-12 DIAGNOSIS — N184 Chronic kidney disease, stage 4 (severe): Secondary | ICD-10-CM | POA: Diagnosis not present

## 2019-12-12 DIAGNOSIS — E889 Metabolic disorder, unspecified: Secondary | ICD-10-CM | POA: Diagnosis not present

## 2019-12-12 DIAGNOSIS — E559 Vitamin D deficiency, unspecified: Secondary | ICD-10-CM | POA: Insufficient documentation

## 2019-12-12 DIAGNOSIS — N185 Chronic kidney disease, stage 5: Secondary | ICD-10-CM | POA: Diagnosis not present

## 2019-12-12 DIAGNOSIS — E8779 Other fluid overload: Secondary | ICD-10-CM | POA: Diagnosis not present

## 2019-12-12 DIAGNOSIS — I1310 Hypertensive heart and chronic kidney disease without heart failure, with stage 1 through stage 4 chronic kidney disease, or unspecified chronic kidney disease: Secondary | ICD-10-CM | POA: Diagnosis not present

## 2019-12-12 HISTORY — DX: Fluid overload, unspecified: E87.70

## 2019-12-12 HISTORY — DX: Vitamin D deficiency, unspecified: E55.9

## 2019-12-26 ENCOUNTER — Telehealth: Payer: Self-pay | Admitting: Family Medicine

## 2019-12-26 NOTE — Telephone Encounter (Signed)
Patient's daughter states she will drop off  A FMLA form that she needs filled out. Patient's daughter states she need to come and care for her father. Patient's daughter lives [place].

## 2019-12-26 NOTE — Telephone Encounter (Signed)
Pt's daughter dropped off document to be filled out by provider ([place]- 2 pages) Pt's daughter Jakub Debold) would like document to be faxed to [phone]. Document put at front office tray under providers name.

## 2019-12-29 NOTE — Telephone Encounter (Signed)
Duplicate messgae//see other telelphone message regarding this request

## 2019-12-29 NOTE — Telephone Encounter (Signed)
Form has been completed by PCP Faxed and received fax confirmation. Called the daughter to inform completed/faxed. Have a copy for scan/original for the daughter if she likes .

## 2019-12-29 NOTE — Telephone Encounter (Signed)
The daughter called back and mailed her the original for her records///copy sent to scan. Mailed to [facility] [address]. [place] , [place]....  [phone]

## 2020-02-02 DIAGNOSIS — D72819 Decreased white blood cell count, unspecified: Secondary | ICD-10-CM | POA: Diagnosis not present

## 2020-02-02 DIAGNOSIS — M7989 Other specified soft tissue disorders: Secondary | ICD-10-CM | POA: Diagnosis not present

## 2020-02-02 DIAGNOSIS — D696 Thrombocytopenia, unspecified: Secondary | ICD-10-CM | POA: Diagnosis not present

## 2020-02-02 DIAGNOSIS — C8331 Diffuse large B-cell lymphoma, lymph nodes of head, face, and neck: Secondary | ICD-10-CM | POA: Diagnosis not present

## 2020-02-16 ENCOUNTER — Other Ambulatory Visit: Payer: Self-pay

## 2020-02-16 MED ORDER — HYDRALAZINE HCL 10 MG PO TABS: 10.0000 mg | ORAL_TABLET | Freq: Three times a day (TID) | ORAL | 2 refills | Status: DC

## 2020-02-16 NOTE — Telephone Encounter (Signed)
Refill for Hydralazine sent to [facility].

## 2020-02-17 NOTE — Telephone Encounter (Signed)
This was faxed on 12/29/19 and on the counter, but may need updated one?

## 2020-02-17 NOTE — Telephone Encounter (Signed)
Phone: [phone]  Eric Herring states part B needs to be completed for the dates 02/20/20-05/14/20

## 2020-02-17 NOTE — Telephone Encounter (Signed)
Called informed the patients care giver of PCP response. She will let the company know this

## 2020-02-17 NOTE — Telephone Encounter (Signed)
The current form says from 05/2018 - lifetime. That includes those dates. Ty.

## 2020-02-25 ENCOUNTER — Telehealth: Payer: Self-pay | Admitting: Family Medicine

## 2020-02-25 MED ORDER — ALBUTEROL SULFATE (2.5 MG/3ML) 0.083% IN NEBU: INHALATION_SOLUTION | RESPIRATORY_TRACT | 0 refills | Status: DC

## 2020-02-25 NOTE — Telephone Encounter (Signed)
Refill done.  

## 2020-02-25 NOTE — Telephone Encounter (Signed)
Medication:  albuterol (PROVENTIL) (2.5 MG/3ML) 0.083% nebulizer solution  Has the patient contacted their pharmacy? No. (If no, request that the patient contact the pharmacy for the refill.) (If yes, when and what did the pharmacy advise?)  Preferred Pharmacy (with phone number or street name):  [facility], [place] [address]  Phone:  [phone] Fax:  [phone]  DEA #:  --  Agent: Please be advised that RX refills may take up to 3 business days. We ask that you follow-up with your pharmacy.

## 2020-03-29 ENCOUNTER — Other Ambulatory Visit: Payer: Self-pay

## 2020-03-29 ENCOUNTER — Ambulatory Visit (INDEPENDENT_AMBULATORY_CARE_PROVIDER_SITE_OTHER): Payer: Medicare HMO | Admitting: Family Medicine

## 2020-03-29 ENCOUNTER — Encounter: Payer: Self-pay | Admitting: Family Medicine

## 2020-03-29 VITALS — BP 102/68 | Temp 97.7°F | Ht 68.0 in | Wt 185.5 lb

## 2020-03-29 DIAGNOSIS — I10 Essential (primary) hypertension: Secondary | ICD-10-CM | POA: Insufficient documentation

## 2020-03-29 DIAGNOSIS — E1140 Type 2 diabetes mellitus with diabetic neuropathy, unspecified: Secondary | ICD-10-CM | POA: Insufficient documentation

## 2020-03-29 DIAGNOSIS — K635 Polyp of colon: Secondary | ICD-10-CM | POA: Insufficient documentation

## 2020-03-29 DIAGNOSIS — I340 Nonrheumatic mitral (valve) insufficiency: Secondary | ICD-10-CM | POA: Insufficient documentation

## 2020-03-29 DIAGNOSIS — N189 Chronic kidney disease, unspecified: Secondary | ICD-10-CM | POA: Insufficient documentation

## 2020-03-29 DIAGNOSIS — T7840XA Allergy, unspecified, initial encounter: Secondary | ICD-10-CM | POA: Insufficient documentation

## 2020-03-29 DIAGNOSIS — I272 Pulmonary hypertension, unspecified: Secondary | ICD-10-CM | POA: Insufficient documentation

## 2020-03-29 DIAGNOSIS — I071 Rheumatic tricuspid insufficiency: Secondary | ICD-10-CM | POA: Insufficient documentation

## 2020-03-29 DIAGNOSIS — J449 Chronic obstructive pulmonary disease, unspecified: Secondary | ICD-10-CM | POA: Insufficient documentation

## 2020-03-29 DIAGNOSIS — E119 Type 2 diabetes mellitus without complications: Secondary | ICD-10-CM | POA: Insufficient documentation

## 2020-03-29 DIAGNOSIS — C8590 Non-Hodgkin lymphoma, unspecified, unspecified site: Secondary | ICD-10-CM | POA: Insufficient documentation

## 2020-03-29 DIAGNOSIS — C61 Malignant neoplasm of prostate: Secondary | ICD-10-CM | POA: Insufficient documentation

## 2020-03-29 DIAGNOSIS — I2510 Atherosclerotic heart disease of native coronary artery without angina pectoris: Secondary | ICD-10-CM | POA: Insufficient documentation

## 2020-03-29 DIAGNOSIS — I517 Cardiomegaly: Secondary | ICD-10-CM | POA: Insufficient documentation

## 2020-03-29 DIAGNOSIS — R234 Changes in skin texture: Secondary | ICD-10-CM | POA: Diagnosis not present

## 2020-03-29 DIAGNOSIS — R600 Localized edema: Secondary | ICD-10-CM | POA: Diagnosis not present

## 2020-03-29 DIAGNOSIS — E7800 Pure hypercholesterolemia, unspecified: Secondary | ICD-10-CM | POA: Insufficient documentation

## 2020-03-29 DIAGNOSIS — I429 Cardiomyopathy, unspecified: Secondary | ICD-10-CM | POA: Insufficient documentation

## 2020-03-29 DIAGNOSIS — R011 Cardiac murmur, unspecified: Secondary | ICD-10-CM | POA: Insufficient documentation

## 2020-03-29 DIAGNOSIS — M129 Arthropathy, unspecified: Secondary | ICD-10-CM | POA: Insufficient documentation

## 2020-03-29 DIAGNOSIS — I700 Atherosclerosis of aorta: Secondary | ICD-10-CM | POA: Insufficient documentation

## 2020-03-29 MED ORDER — FUROSEMIDE 80 MG PO TABS: 80.0000 mg | ORAL_TABLET | Freq: Every day | ORAL | 3 refills | Status: DC

## 2020-03-29 MED ORDER — PRAVASTATIN SODIUM 20 MG PO TABS: 20.0000 mg | ORAL_TABLET | Freq: Every day | ORAL | 3 refills | Status: DC

## 2020-03-29 MED ORDER — ALBUTEROL SULFATE (2.5 MG/3ML) 0.083% IN NEBU: INHALATION_SOLUTION | RESPIRATORY_TRACT | 5 refills | Status: DC

## 2020-03-29 MED ORDER — AMMONIUM LACTATE 12 % EX CREA: TOPICAL_CREAM | CUTANEOUS | 0 refills | Status: DC | PRN

## 2020-03-29 NOTE — Patient Instructions (Addendum)
For the swelling in your lower extremities, be sure to elevate your legs when able, mind the salt intake, stay physically active and consider wearing compression stockings.  Use the new lotion on the skin.  Call Dr. Halford Chessman for an appointment.   Let us know if you need anything.

## 2020-03-29 NOTE — Progress Notes (Signed)
Chief Complaint  Patient presents with  . Edema    hands and feet  . discuss pravastatin, aspirin, calcium and vitamin D    Eric Herring here for bilateral foot swelling.  He is here with his daughter.  Duration: 4 months Hx of prolonged bedrest, recent surgery, travel or injury? No Pain the calf? No SOB?  Yes, but no association with the swelling Personal or family history of clot or bleeding disorder? No Hx of heart failure, renal failure, hepatic failure? Yes, hx of heart and renal failure.  No CP.  He does not routinely weigh himself.  Past Medical History:  Diagnosis Date  . Acute on chronic renal insufficiency 12/11/2017  . Allergy   . Aortic atherosclerosis ([place])   . Arthropathy   . CAD (coronary artery disease)   . Cardiomyopathy ([place])   . Chronic kidney disease   . Chronic renal failure 11/12/2019  . Chronic systolic congestive heart failure ([place]) 01/23/2019  . Colon polyp   . COPD (chronic obstructive pulmonary disease) ([place])   . Diabetes ([place])   . Diabetic neuropathy ([place])   . Dilated cardiomyopathy ([place]) 11/20/2017  . Edema 10/08/2014  . Heart murmur   . Hypercholesterolemia   . Hypertension    pulmonary  . LVH (left ventricular hypertrophy)   . Lymphoma ([place])   . Metabolic bone disease 0/99/8338  . Mitral regurgitation   . Prostate cancer ([place])   . Pulmonary hypertension ([place])   . Tricuspid regurgitation   . Type 2 diabetes mellitus with neurological complications ([place]) 06/12/537  . Vitamin D deficiency 12/12/2019  . Volume overload 12/12/2019   Family History  Problem Relation Age of Onset  . COPD Father    Past Surgical History:  Procedure Laterality Date  . HEMORROIDECTOMY     pt does not remember year    Current Outpatient Medications:  .  albuterol (PROVENTIL HFA;VENTOLIN HFA) 108 (90 Base) MCG/ACT inhaler, Inhale 2 puffs into the lungs every 6 (six) hours as needed for wheezing or shortness of breath., Disp: , Rfl:  .  albuterol (PROVENTIL) (2.5  MG/3ML) 0.083% nebulizer solution, USE 1 VIAL IN NEBULIZER EVERY 6 HOURS AS NEEDED FOR WHEEZING FOR SHORTNESS OF BREATH, Disp: 75 mL, Rfl: 5 .  aspirin 81 MG tablet, Take 81 mg by mouth daily., Disp: , Rfl:  .  Calcium Carb-Cholecalciferol (CALCIUM-VITAMIN D) 500-200 MG-UNIT tablet, Take 1 tablet by mouth daily. , Disp: , Rfl:  .  fexofenadine (ALLEGRA ALLERGY) 180 MG tablet, Take 1 tablet (180 mg total) by mouth daily., Disp: 30 tablet, Rfl: 5 .  Fluticasone-Umeclidin-Vilant (TRELEGY ELLIPTA) 100-62.5-25 MCG/INH AEPB, Inhale 1 puff into the lungs daily., Disp: 30 each, Rfl: 5 .  hydrALAZINE (APRESOLINE) 10 MG tablet, Take 1 tablet (10 mg total) by mouth 3 (three) times daily., Disp: 270 tablet, Rfl: 2 .  isosorbide mononitrate (IMDUR) 30 MG 24 hr tablet, TAKE 1 TABLET EVERY DAY, Disp: 90 tablet, Rfl: 1 .  Multiple Vitamin (MULTIVITAMIN) tablet, Take 1 tablet by mouth. , Disp: , Rfl:  .  PACERONE 200 MG tablet, Take 1 tablet by mouth once daily, Disp: 90 tablet, Rfl: 0 .  pravastatin (PRAVACHOL) 20 MG tablet, Take 1 tablet (20 mg total) by mouth daily., Disp: 90 tablet, Rfl: 3 .  albuterol (PROVENTIL) (2.5 MG/3ML) 0.083% nebulizer solution, Inhale 2.5 mg into the lungs 4 (four) times daily., Disp: , Rfl:  .  ammonium lactate (LAC-HYDRIN) 12 % cream, Apply topically as needed for dry  skin., Disp: 385 g, Rfl: 0 .  furosemide (LASIX) 80 MG tablet, Take 1 tablet (80 mg total) by mouth daily., Disp: 30 tablet, Rfl: 3  BP 102/68 (BP Location: Right Arm, Patient Position: Sitting, Cuff Size: Normal)   Temp 97.7 F (36.5 C) (Oral)   Ht 5\' 8"  (1.727 m)   Wt 185 lb 8 oz (84.1 kg)   BMI 28.21 kg/m  Gen- awake, alert, appears stated age Heart- RRR, 4+ pitting edema bilaterally starting to taper at the mid tibia and gone at the knee bilaterally Lungs- CTAB, normal effort w/o accessory muscle use MSK-no calf pain bilaterally Skin: Hyperpigmentation and scaling of the skin on lower extremities  bilaterally Psych: Normal judgment and insight  Localized edema - Plan: furosemide (LASIX) 80 MG tablet  Scaling of skin - Plan: ammonium lactate (LAC-HYDRIN) 12 % cream  1.  Increase dosage of Lasix.  With his renal failure, I doubt the 40 mg dosage is potent enough and it sounds like his urinary frequency is more related to prostate issues rather than efficacy of the diuretic.  His last potassium was on the upper limit of normal so we will recheck in 1 week to see if we need to supplement.  I will hold off on adding any at this time.  Elevate the legs, activity as tolerated, mind the salt intake, and a prescription for compression stockings were provided today.  I would like to see him in 1 week. 2.  Continue to hydrate, will add twice daily Lac-Hydrin. Pt and his family members voiced understanding and agreement to the plan.  [place], DO 03/29/20  11:29 AM

## 2020-03-30 ENCOUNTER — Ambulatory Visit: Payer: Medicare HMO | Admitting: Cardiology

## 2020-03-30 ENCOUNTER — Encounter: Payer: Self-pay | Admitting: Cardiology

## 2020-03-30 VITALS — BP 100/80 | HR 79 | Ht 68.0 in | Wt 183.0 lb

## 2020-03-30 DIAGNOSIS — I272 Pulmonary hypertension, unspecified: Secondary | ICD-10-CM

## 2020-03-30 DIAGNOSIS — R600 Localized edema: Secondary | ICD-10-CM

## 2020-03-30 DIAGNOSIS — I340 Nonrheumatic mitral (valve) insufficiency: Secondary | ICD-10-CM

## 2020-03-30 DIAGNOSIS — I420 Dilated cardiomyopathy: Secondary | ICD-10-CM

## 2020-03-30 MED ORDER — FUROSEMIDE 80 MG PO TABS: ORAL_TABLET | ORAL | 1 refills | Status: DC

## 2020-03-30 NOTE — Addendum Note (Signed)
Addended by: Senaida Ores on: 03/30/2020 01:50 PM   Modules accepted: Orders

## 2020-03-30 NOTE — Progress Notes (Signed)
Cardiology Office Note:    Date:  03/30/2020   ID:  Eric Herring, DOB 1935-01-21, MRN [id number]  PCP:  Shelda Pal, DO  Cardiologist:  Jenne Campus, MD    Referring MD: Shelda Pal*   Chief Complaint  Patient presents with  . Follow-up  Not doing well  History of Present Illness:    Eric Herring is a 85 y.o. male with past medical history significant for cardiomyopathy with ejection fraction 20 to 25%, essential hypertension, diabetes, chronic renal failure with creatinine at the level of 4-5 he does not want to be dialyzed. He comes today 2 months of follow-up. Overall he is doing poorly he complains of having significant swelling of lower extremities. Does not do much just sits in the chair sometimes he watch football games.  Past Medical History:  Diagnosis Date  . Acute on chronic renal insufficiency 12/11/2017  . Allergy   . Aortic atherosclerosis ([place])   . Arthropathy   . CAD (coronary artery disease)   . Cardiomyopathy ([place])   . Chronic kidney disease   . Chronic renal failure 11/12/2019  . Chronic systolic congestive heart failure ([place]) 01/23/2019  . Colon polyp   . COPD (chronic obstructive pulmonary disease) ([place])   . Diabetes ([place])   . Diabetic neuropathy ([place])   . Dilated cardiomyopathy ([place]) 11/20/2017  . Edema 10/08/2014  . Heart murmur   . Hypercholesterolemia   . Hypertension    pulmonary  . LVH (left ventricular hypertrophy)   . Lymphoma ([place])   . Metabolic bone disease 4/00/8676  . Mitral regurgitation   . Prostate cancer ([place])   . Pulmonary hypertension ([place])   . Tricuspid regurgitation   . Type 2 diabetes mellitus with neurological complications ([place]) 05/17/5091  . Vitamin D deficiency 12/12/2019  . Volume overload 12/12/2019    Past Surgical History:  Procedure Laterality Date  . HEMORROIDECTOMY     pt does not remember year    Current Medications: Current Meds  Medication Sig  . albuterol (PROVENTIL HFA;VENTOLIN  HFA) 108 (90 Base) MCG/ACT inhaler Inhale 2 puffs into the lungs every 6 (six) hours as needed for wheezing or shortness of breath.  Marland Kitchen albuterol (PROVENTIL) (2.5 MG/3ML) 0.083% nebulizer solution USE 1 VIAL IN NEBULIZER EVERY 6 HOURS AS NEEDED FOR WHEEZING FOR SHORTNESS OF BREATH  . ammonium lactate (LAC-HYDRIN) 12 % cream Apply topically as needed for dry skin.  Marland Kitchen aspirin 81 MG tablet Take 81 mg by mouth daily.  . Calcium Carb-Cholecalciferol (CALCIUM-VITAMIN D) 500-200 MG-UNIT tablet Take 1 tablet by mouth daily.   . fexofenadine (ALLEGRA ALLERGY) 180 MG tablet Take 1 tablet (180 mg total) by mouth daily.  . Fluticasone-Umeclidin-Vilant (TRELEGY ELLIPTA) 100-62.5-25 MCG/INH AEPB Inhale 1 puff into the lungs daily.  . furosemide (LASIX) 80 MG tablet Take 1 tablet (80 mg total) by mouth daily.  . hydrALAZINE (APRESOLINE) 10 MG tablet Take 1 tablet (10 mg total) by mouth 3 (three) times daily.  . isosorbide mononitrate (IMDUR) 30 MG 24 hr tablet TAKE 1 TABLET EVERY DAY  . Multiple Vitamin (MULTIVITAMIN) tablet Take 1 tablet by mouth.   Marland Kitchen PACERONE 200 MG tablet Take 1 tablet by mouth once daily  . pravastatin (PRAVACHOL) 20 MG tablet Take 1 tablet (20 mg total) by mouth daily.     Allergies:   Lisinopril   Social History   Socioeconomic History  . Marital status: Married    Spouse name: Not on file  . Number  of children: Not on file  . Years of education: Not on file  . Highest education level: Not on file  Occupational History  . Not on file  Tobacco Use  . Smoking status: Former Smoker    Packs/day: 1.00    Years: 25.00    Pack years: 25.00    Types: Cigarettes    Quit date: 11/30/2007    Years since quitting: 12.3  . Smokeless tobacco: Never Used  Vaping Use  . Vaping Use: Never used  Substance and Sexual Activity  . Alcohol use: No  . Drug use: No  . Sexual activity: Not on file  Other Topics Concern  . Not on file  Social History Narrative  . Not on file   Social  Determinants of Health   Financial Resource Strain:   . Difficulty of Paying Living Expenses: Not on file  Food Insecurity:   . Worried About Charity fundraiser in the Last Year: Not on file  . Ran Out of Food in the Last Year: Not on file  Transportation Needs:   . Lack of Transportation (Medical): Not on file  . Lack of Transportation (Non-Medical): Not on file  Physical Activity:   . Days of Exercise per Week: Not on file  . Minutes of Exercise per Session: Not on file  Stress:   . Feeling of Stress : Not on file  Social Connections:   . Frequency of Communication with Friends and Family: Not on file  . Frequency of Social Gatherings with Friends and Family: Not on file  . Attends Religious Services: Not on file  . Active Member of Clubs or Organizations: Not on file  . Attends Archivist Meetings: Not on file  . Marital Status: Not on file     Family History: The patient's family history includes COPD in his father. ROS:   Please see the history of present illness.    All 14 point review of systems negative except as described per history of present illness  EKGs/Labs/Other Studies Reviewed:      Recent Labs: 05/23/2019: ALT 17 11/27/2019: BUN 39; Creatinine, Ser 3.63; Potassium 5.0; Sodium 135  Recent Lipid Panel    Component Value Date/Time   CHOL 154 05/23/2019 1144   TRIG 87.0 05/23/2019 1144   HDL 48.70 05/23/2019 1144   CHOLHDL 3 05/23/2019 1144   VLDL 17.4 05/23/2019 1144   LDLCALC 88 05/23/2019 1144    Physical Exam:    VS:  BP 100/80 (BP Location: Left Arm, Patient Position: Sitting, Cuff Size: Normal)   Pulse 79   Ht 5\' 8"  (1.727 m)   Wt 183 lb (83 kg)   SpO2 91%   BMI 27.83 kg/m     Wt Readings from Last 3 Encounters:  03/30/20 183 lb (83 kg)  03/29/20 185 lb 8 oz (84.1 kg)  11/21/19 172 lb (78 kg)     GEN:  Well nourished, well developed in no acute distress HEENT: Normal NECK: No JVD; No carotid bruits LYMPHATICS: No  lymphadenopathy CARDIAC: RRR, no murmurs, no rubs, no gallops RESPIRATORY:  Clear to auscultation without rales, wheezing or rhonchi, poor air entry bilaterally ABDOMEN: Soft, non-tender, non-distended MUSCULOSKELETAL:  No edema; No deformity  SKIN: Warm and dry LOWER EXTREMITIES: 2+ swelling NEUROLOGIC:  Alert and oriented x 3 PSYCHIATRIC:  Normal affect   ASSESSMENT:    1. Dilated cardiomyopathy ([place])   2. Pulmonary hypertension (HCC)   3. Nonrheumatic mitral valve regurgitation  PLAN:    In order of problems listed above:  1. Dilated cardiomyopathy with severely reduced left ventricle ejection fraction. Because of low blood pressure need to increase diuresis I will discontinue his hydralazine temporarily. Plan for today will be to check his Chem-7 I will increase dose of furosemide from 80 twice daily to 120 the morning and 80 in the afternoon. 2. Pulmonary hypertension: Not a chronic problem. 3. Nonrheumatic mitral valve regurgitation conservative approach. 4. Chronic kidney failure: He does not want to have dialysis gradually slowly getting worse. We will check his Chem-7 today try we will try to increase diuresis however we will try to maintain his blood pressure.   Medication Adjustments/Labs and Tests Ordered: Current medicines are reviewed at length with the patient today.  Concerns regarding medicines are outlined above.  No orders of the defined types were placed in this encounter.  Medication changes: No orders of the defined types were placed in this encounter.   Signed, Park Liter, MD, [facility] 03/30/2020 1:40 PM    [place]

## 2020-03-30 NOTE — Patient Instructions (Signed)
Medication Instructions:  Your physician has recommended you make the following change in your medication:   INCREASE: LASIX to 120 mg in the morning and 80 mg in the evening daily  *If you need a refill on your cardiac medications before your next appointment, please call your pharmacy*   Lab Work: Your physician recommends that you return for lab work today: bmp   If you have labs (blood work) drawn today and your tests are completely normal, you will receive your results only by: Marland Kitchen MyChart Message (if you have MyChart) OR . A paper copy in the mail If you have any lab test that is abnormal or we need to change your treatment, we will call you to review the results.   Testing/Procedures: None.   Follow-Up: At [facility], you and your health needs are our priority.  As part of our continuing mission to provide you with exceptional heart care, we have created designated Provider Care Teams.  These Care Teams include your primary Cardiologist (physician) and Advanced Practice Providers (APPs -  Physician Assistants and Nurse Practitioners) who all work together to provide you with the care you need, when you need it.  We recommend signing up for the patient portal called "MyChart".  Sign up information is provided on this After Visit Summary.  MyChart is used to connect with patients for Virtual Visits (Telemedicine).  Patients are able to view lab/test results, encounter notes, upcoming appointments, etc.  Non-urgent messages can be sent to your provider as well.   To learn more about what you can do with MyChart, go to [email or web address].    Your next appointment:   1 month(s)  The format for your next appointment:   In Person  Provider:   Jenne Campus, MD   Other Instructions

## 2020-03-31 DIAGNOSIS — H524 Presbyopia: Secondary | ICD-10-CM | POA: Diagnosis not present

## 2020-03-31 DIAGNOSIS — Z0100 Encounter for examination of eyes and vision without abnormal findings: Secondary | ICD-10-CM | POA: Diagnosis not present

## 2020-03-31 DIAGNOSIS — H35033 Hypertensive retinopathy, bilateral: Secondary | ICD-10-CM | POA: Diagnosis not present

## 2020-03-31 DIAGNOSIS — H26493 Other secondary cataract, bilateral: Secondary | ICD-10-CM | POA: Diagnosis not present

## 2020-04-05 ENCOUNTER — Ambulatory Visit: Payer: Medicare HMO | Admitting: Family Medicine

## 2020-04-05 DIAGNOSIS — Z0289 Encounter for other administrative examinations: Secondary | ICD-10-CM

## 2020-04-12 ENCOUNTER — Encounter: Payer: Self-pay | Admitting: Family Medicine

## 2020-04-12 ENCOUNTER — Other Ambulatory Visit: Payer: Self-pay

## 2020-04-12 ENCOUNTER — Ambulatory Visit (INDEPENDENT_AMBULATORY_CARE_PROVIDER_SITE_OTHER): Payer: Medicare HMO | Admitting: Family Medicine

## 2020-04-12 VITALS — BP 104/70 | HR 79 | Temp 97.7°F | Ht 72.0 in | Wt 156.0 lb

## 2020-04-12 DIAGNOSIS — J449 Chronic obstructive pulmonary disease, unspecified: Secondary | ICD-10-CM | POA: Diagnosis not present

## 2020-04-12 DIAGNOSIS — R600 Localized edema: Secondary | ICD-10-CM

## 2020-04-12 DIAGNOSIS — R234 Changes in skin texture: Secondary | ICD-10-CM

## 2020-04-12 NOTE — Progress Notes (Addendum)
Chief Complaint  Patient presents with  . Follow-up    Subjective: Patient is a 85 y.o. male here for f/u LE edema.  He is with his daughter.  I saw the patient several weeks ago and increase his Lasix from 40 mg daily to 80 mg daily.  The next day he saw the cardiologist to increase his Lasix again to 120 mg in the morning and 80 mg in the evening.  This patient has lost nearly 30 pounds in fluid and notes marked improvement in his swelling and some improvement in his breathing.  He is wearing compression stockings routinely.  His skin has gotten better with the Lac-Hydrin lotion as well.  He does have a history of COPD.  He was prescribed Trelegy by the pulmonology team around 1 year ago, but it is unclear whether he is taking it.  He uses his albuterol nebulizer twice daily as he is short of breath.  No current wheezing, fevers, or coughing.  Past Medical History:  Diagnosis Date  . Acute on chronic renal insufficiency 12/11/2017  . Allergy   . Aortic atherosclerosis ([place])   . Arthropathy   . CAD (coronary artery disease)   . Cardiomyopathy ([place])   . Chronic kidney disease   . Chronic renal failure 11/12/2019  . Chronic systolic congestive heart failure ([place]) 01/23/2019  . Colon polyp   . COPD (chronic obstructive pulmonary disease) ([place])   . Diabetes ([place])   . Diabetic neuropathy ([place])   . Dilated cardiomyopathy ([place]) 11/20/2017  . Edema 10/08/2014  . Heart murmur   . Hypercholesterolemia   . Hypertension    pulmonary  . LVH (left ventricular hypertrophy)   . Lymphoma ([place])   . Metabolic bone disease 0/23/3435  . Mitral regurgitation   . Prostate cancer ([place])   . Pulmonary hypertension ([place])   . Tricuspid regurgitation   . Type 2 diabetes mellitus with neurological complications ([place]) 10/14/6166  . Vitamin D deficiency 12/12/2019  . Volume overload 12/12/2019    Objective: BP 104/70 (BP Location: Left Arm, Patient Position: Sitting, Cuff Size: Normal)   Pulse 79   Temp 97.7 F  (36.5 C) (Oral)   Ht 6' (1.829 m)   Wt 156 lb (70.8 kg)   SpO2 96%   BMI 21.16 kg/m  General: Awake, appears stated age Heart: RRR, 1+ pitting edema in the lower extremities tapering at the mid tibia bilaterally Lungs: CTAB, no rales, wheezes or rhonchi. No accessory muscle use Psych: normal affect and mood  Assessment and Plan: Localized edema  Scaling of skin  Chronic obstructive pulmonary disease, unspecified COPD type ([place]) - Plan: Ambulatory referral to Pulmonology  1.  Continue to stay as active as possible, elevate legs, wear compression stockings, and use Lasix as prescribed by the cardiology team.  He has a repeat BMP tomorrow. 2.  Okay to space out Lac-Hydrin use to every other day and stop if stable. 3.  We will refer him back to pulmonology as cost is a barrier to obtaining maintenance inhalers. I will see him in 2 months for his diabetic visit. The patient and his daughter voiced understanding and agreement to the plan.  [place], DO 04/12/20  12:22 PM

## 2020-04-12 NOTE — Patient Instructions (Addendum)
Try using the Lac Hydrin lotion every other day and then stop. If you need to continue, that is fine also.   Great work with your swelling. You look great.   Continue minding your salt intake and wearing your compression stockings.   If you do not hear anything about your referral in the next 1-2 weeks, call our office and ask for an update.  Let us know if you need anything.

## 2020-04-26 ENCOUNTER — Ambulatory Visit: Payer: Medicare HMO | Admitting: Primary Care

## 2020-04-26 ENCOUNTER — Other Ambulatory Visit: Payer: Self-pay | Admitting: Family Medicine

## 2020-04-26 ENCOUNTER — Telehealth: Payer: Self-pay | Admitting: Family Medicine

## 2020-04-26 DIAGNOSIS — J449 Chronic obstructive pulmonary disease, unspecified: Secondary | ICD-10-CM

## 2020-04-26 NOTE — Telephone Encounter (Signed)
Patient daughter would like a referral to a pulmonologist in [place].  Please advise

## 2020-04-26 NOTE — Telephone Encounter (Signed)
That's fine. Dx COPD. He already sees [facility], if they don't have anything in the area, [facility] might be better. Ty.

## 2020-04-26 NOTE — Telephone Encounter (Signed)
REFERRAL DONE

## 2020-05-04 DIAGNOSIS — C61 Malignant neoplasm of prostate: Secondary | ICD-10-CM | POA: Insufficient documentation

## 2020-05-05 ENCOUNTER — Ambulatory Visit: Payer: Medicare HMO | Admitting: Cardiology

## 2020-05-05 ENCOUNTER — Other Ambulatory Visit: Payer: Self-pay

## 2020-05-05 ENCOUNTER — Encounter: Payer: Self-pay | Admitting: Cardiology

## 2020-05-05 VITALS — BP 112/60 | HR 88 | Ht 72.0 in | Wt 161.0 lb

## 2020-05-05 DIAGNOSIS — I340 Nonrheumatic mitral (valve) insufficiency: Secondary | ICD-10-CM

## 2020-05-05 DIAGNOSIS — I2510 Atherosclerotic heart disease of native coronary artery without angina pectoris: Secondary | ICD-10-CM

## 2020-05-05 DIAGNOSIS — I420 Dilated cardiomyopathy: Secondary | ICD-10-CM

## 2020-05-05 DIAGNOSIS — I272 Pulmonary hypertension, unspecified: Secondary | ICD-10-CM

## 2020-05-05 NOTE — Patient Instructions (Addendum)

## 2020-05-05 NOTE — Progress Notes (Signed)
Cardiology Office Note:    Date:  05/05/2020   ID:  Eric Herring, DOB [birth year], MRN [id number]  PCP:  Shelda Pal, DO  Cardiologist:  Jenne Campus, MD    Referring MD: Shelda Pal*   No chief complaint on file. Feeling better  History of Present Illness:    Eric Herring is a 85 y.o. male with past medical history significant for cardiomyopathy with ejection fraction 20 to 25%, diabetes mellitus, essential hypertension chronic renal failure with creatinine between 4 and 5.  He does not want to be dialyzed.  He comes today to my office for follow-up overall he says he is feeling better swelling is actually improved and he is looking good.  He said he ate quite good at Christmas does complain of having exertional shortness of breath but considering his condition I think is looking good  Past Medical History:  Diagnosis Date  . Acute on chronic renal insufficiency 12/11/2017  . Allergy   . Aortic atherosclerosis ([place])   . Arthropathy   . CAD (coronary artery disease)   . Cardiomyopathy ([place])   . Chronic kidney disease   . Chronic renal failure 11/12/2019  . Chronic systolic congestive heart failure ([place]) 01/23/2019  . Colon polyp   . COPD (chronic obstructive pulmonary disease) ([place])   . Diabetes ([place])   . Diabetic neuropathy ([place])   . Dilated cardiomyopathy ([place]) 11/20/2017  . Edema 10/08/2014  . Heart murmur   . Hypercholesterolemia   . Hypertension    pulmonary  . LVH (left ventricular hypertrophy)   . Lymphoma ([place])   . Metabolic bone disease 7/90/2409  . Mitral regurgitation   . Prostate cancer ([place])   . Pulmonary hypertension ([place])   . Tricuspid regurgitation   . Type 2 diabetes mellitus with neurological complications ([place]) 11/08/5327  . Vitamin D deficiency 12/12/2019  . Volume overload 12/12/2019    Past Surgical History:  Procedure Laterality Date  . HEMORROIDECTOMY     pt does not remember year    Current Medications: Current Meds   Medication Sig  . albuterol (PROVENTIL HFA;VENTOLIN HFA) 108 (90 Base) MCG/ACT inhaler Inhale 2 puffs into the lungs every 6 (six) hours as needed for wheezing or shortness of breath.  Marland Kitchen albuterol (PROVENTIL) (2.5 MG/3ML) 0.083% nebulizer solution USE 1 VIAL IN NEBULIZER EVERY 6 HOURS AS NEEDED FOR WHEEZING FOR SHORTNESS OF BREATH  . ammonium lactate (LAC-HYDRIN) 12 % cream Apply topically as needed for dry skin.  Marland Kitchen aspirin 81 MG tablet Take 81 mg by mouth daily.  . Calcium Carb-Cholecalciferol (CALCIUM-VITAMIN D) 500-200 MG-UNIT tablet Take 1 tablet by mouth daily.   . fexofenadine (ALLEGRA ALLERGY) 180 MG tablet Take 1 tablet (180 mg total) by mouth daily.  . Fluticasone-Umeclidin-Vilant (TRELEGY ELLIPTA) 100-62.5-25 MCG/INH AEPB Inhale 1 puff into the lungs daily.  . furosemide (LASIX) 80 MG tablet Take one and 1/2 tablet (120 mg ) in the morning and one tablet (80mg  ) in the evening daily.  . hydrALAZINE (APRESOLINE) 10 MG tablet Take 1 tablet (10 mg total) by mouth 3 (three) times daily.  . isosorbide mononitrate (IMDUR) 30 MG 24 hr tablet TAKE 1 TABLET EVERY DAY  . Multiple Vitamin (MULTIVITAMIN) tablet Take 1 tablet by mouth.   Marland Kitchen PACERONE 200 MG tablet Take 1 tablet by mouth once daily  . pravastatin (PRAVACHOL) 20 MG tablet Take 1 tablet (20 mg total) by mouth daily.     Allergies:   Lisinopril  Social History   Socioeconomic History  . Marital status: Married    Spouse name: Not on file  . Number of children: Not on file  . Years of education: Not on file  . Highest education level: Not on file  Occupational History  . Not on file  Tobacco Use  . Smoking status: Former Smoker    Packs/day: 1.00    Years: 25.00    Pack years: 25.00    Types: Cigarettes    Quit date: 11/30/2007    Years since quitting: 12.4  . Smokeless tobacco: Never Used  Vaping Use  . Vaping Use: Never used  Substance and Sexual Activity  . Alcohol use: No  . Drug use: No  . Sexual activity: Not  on file  Other Topics Concern  . Not on file  Social History Narrative  . Not on file   Social Determinants of Health   Financial Resource Strain: Not on file  Food Insecurity: Not on file  Transportation Needs: Not on file  Physical Activity: Not on file  Stress: Not on file  Social Connections: Not on file     Family History: The patient's family history includes COPD in his father. ROS:   Please see the history of present illness.    All 14 point review of systems negative except as described per history of present illness  EKGs/Labs/Other Studies Reviewed:      Recent Labs: 05/23/2019: ALT 17 11/27/2019: BUN 39; Creatinine, Ser 3.63; Potassium 5.0; Sodium 135  Recent Lipid Panel    Component Value Date/Time   CHOL 154 05/23/2019 1144   TRIG 87.0 05/23/2019 1144   HDL 48.70 05/23/2019 1144   CHOLHDL 3 05/23/2019 1144   VLDL 17.4 05/23/2019 1144   LDLCALC 88 05/23/2019 1144    Physical Exam:    VS:  BP 112/60 (BP Location: Right Arm, Patient Position: Sitting, Cuff Size: Normal)   Pulse 88   Ht 6' (1.829 m)   Wt 161 lb (73 kg)   BMI 21.84 kg/m     Wt Readings from Last 3 Encounters:  05/05/20 161 lb (73 kg)  04/12/20 156 lb (70.8 kg)  03/30/20 183 lb (83 kg)     GEN:  Well nourished, well developed in no acute distress HEENT: Normal NECK: No JVD; No carotid bruits LYMPHATICS: No lymphadenopathy CARDIAC: RRR, no murmurs, no rubs, no gallops RESPIRATORY:  Clear to auscultation without rales, wheezing or rhonchi  ABDOMEN: Soft, non-tender, non-distended MUSCULOSKELETAL:  No edema; No deformity  SKIN: Warm and dry LOWER EXTREMITIES: Mild swelling NEUROLOGIC:  Alert and oriented x 3 PSYCHIATRIC:  Normal affect   ASSESSMENT:    1. Pulmonary hypertension (HCC)   2. Dilated cardiomyopathy ([place])   3. Nonrheumatic mitral valve regurgitation   4. Coronary artery disease involving native coronary artery of native heart without angina pectoris    PLAN:     In order of problems listed above:  1. Pulmonary hypertension known chronic, with to consider palliative care. 2. Dilated cardiomyopathy.  Seems to be compensated today I will continue present management.  Issues blood pressure being low therefore we cannot increase much of his medications. 3. Nonrheumatic mitral valve regurgitation, conservative approach. 4. Coronary disease stable.   Medication Adjustments/Labs and Tests Ordered: Current medicines are reviewed at length with the patient today.  Concerns regarding medicines are outlined above.  No orders of the defined types were placed in this encounter.  Medication changes: No orders of the defined types  were placed in this encounter.   Signed, Park Liter, MD, [facility] 05/05/2020 2:17 PM    [facility]

## 2020-05-14 DIAGNOSIS — D72819 Decreased white blood cell count, unspecified: Secondary | ICD-10-CM | POA: Diagnosis not present

## 2020-05-14 DIAGNOSIS — I340 Nonrheumatic mitral (valve) insufficiency: Secondary | ICD-10-CM | POA: Insufficient documentation

## 2020-05-14 DIAGNOSIS — R011 Cardiac murmur, unspecified: Secondary | ICD-10-CM | POA: Insufficient documentation

## 2020-05-14 DIAGNOSIS — I517 Cardiomegaly: Secondary | ICD-10-CM | POA: Insufficient documentation

## 2020-05-14 DIAGNOSIS — E7800 Pure hypercholesterolemia, unspecified: Secondary | ICD-10-CM | POA: Insufficient documentation

## 2020-05-14 DIAGNOSIS — D696 Thrombocytopenia, unspecified: Secondary | ICD-10-CM | POA: Diagnosis not present

## 2020-05-14 DIAGNOSIS — I272 Pulmonary hypertension, unspecified: Secondary | ICD-10-CM | POA: Insufficient documentation

## 2020-05-14 DIAGNOSIS — I10 Essential (primary) hypertension: Secondary | ICD-10-CM | POA: Insufficient documentation

## 2020-05-14 DIAGNOSIS — E1140 Type 2 diabetes mellitus with diabetic neuropathy, unspecified: Secondary | ICD-10-CM | POA: Insufficient documentation

## 2020-05-14 DIAGNOSIS — I071 Rheumatic tricuspid insufficiency: Secondary | ICD-10-CM | POA: Insufficient documentation

## 2020-05-14 DIAGNOSIS — T7840XA Allergy, unspecified, initial encounter: Secondary | ICD-10-CM | POA: Insufficient documentation

## 2020-05-14 DIAGNOSIS — M7989 Other specified soft tissue disorders: Secondary | ICD-10-CM | POA: Diagnosis not present

## 2020-05-14 DIAGNOSIS — C8331 Diffuse large B-cell lymphoma, lymph nodes of head, face, and neck: Secondary | ICD-10-CM | POA: Diagnosis not present

## 2020-05-14 DIAGNOSIS — M129 Arthropathy, unspecified: Secondary | ICD-10-CM | POA: Insufficient documentation

## 2020-05-14 DIAGNOSIS — C8590 Non-Hodgkin lymphoma, unspecified, unspecified site: Secondary | ICD-10-CM | POA: Insufficient documentation

## 2020-05-14 DIAGNOSIS — I700 Atherosclerosis of aorta: Secondary | ICD-10-CM | POA: Insufficient documentation

## 2020-05-14 DIAGNOSIS — K635 Polyp of colon: Secondary | ICD-10-CM | POA: Insufficient documentation

## 2020-06-14 ENCOUNTER — Ambulatory Visit: Payer: Medicare HMO | Admitting: Family Medicine

## 2020-06-21 ENCOUNTER — Ambulatory Visit (INDEPENDENT_AMBULATORY_CARE_PROVIDER_SITE_OTHER): Payer: Medicare HMO | Admitting: Family Medicine

## 2020-06-21 ENCOUNTER — Other Ambulatory Visit: Payer: Self-pay

## 2020-06-21 ENCOUNTER — Encounter: Payer: Self-pay | Admitting: Family Medicine

## 2020-06-21 VITALS — BP 122/78 | Temp 97.5°F | Ht 68.0 in | Wt 164.1 lb

## 2020-06-21 DIAGNOSIS — Z23 Encounter for immunization: Secondary | ICD-10-CM

## 2020-06-21 DIAGNOSIS — E1149 Type 2 diabetes mellitus with other diabetic neurological complication: Secondary | ICD-10-CM | POA: Diagnosis not present

## 2020-06-21 DIAGNOSIS — I5022 Chronic systolic (congestive) heart failure: Secondary | ICD-10-CM | POA: Diagnosis not present

## 2020-06-21 MED ORDER — ALBUTEROL SULFATE (2.5 MG/3ML) 0.083% IN NEBU
INHALATION_SOLUTION | RESPIRATORY_TRACT | 5 refills | Status: DC
Start: 2020-06-21 — End: 2022-11-21

## 2020-06-21 NOTE — Patient Instructions (Signed)
Give [place] 2-3 business days to get the results of your labs back.   For the swelling in your lower extremities, be sure to elevate your legs when able, mind the salt intake, stay physically active and consider wearing compression stockings.  Take the first dose of the Lasix in the morning 1 hour after awakening. Take the second dose 6 hours after the initial dose. Send me a message in 3-4 days if no better with breathing and swelling.  Keep the diet clean and stay active.  Let us know if you need anything.

## 2020-06-21 NOTE — Progress Notes (Signed)
Subjective:   Chief Complaint  Patient presents with  . Follow-up  . Edema    Eric Herring is a 86 y.o. male here for follow-up of diabetes.  Here with family. Eric Herring does not monitor sugars.  Patient does not require insulin.   Medications include: diet controlled Diet is overall healthy.  Exercise: none +SOB, no CP.   On that last week, the patient has been experiencing shortness of breath.  Is mainly with exertion.  He is also having more swelling in his hands and lower extremities.  He has a history of heart failure and chronic kidney disease.  He is compliant with his Lasix dosages, however he takes the 2 doses around 4 hours apart.  He takes 120 mg around 1 PM and 80 mg around 5 PM.  He pees within 20 minutes after initiation is unsure how long the medication last for.  He is not having any chest pain or coughing.  His weight is up a few pounds from his dry weight of 160 pounds.  He does not weigh himself routinely.  He denies any change in his diet and denies increased salt intake.  Past Medical History:  Diagnosis Date  . Acute on chronic renal insufficiency 12/11/2017  . Allergy   . Aortic atherosclerosis ([place])   . Arthropathy   . CAD (coronary artery disease)   . Cardiomyopathy ([place])   . Chronic kidney disease   . Chronic renal failure 11/12/2019  . Chronic systolic congestive heart failure ([place]) 01/23/2019  . Colon polyp   . COPD (chronic obstructive pulmonary disease) ([place])   . Diabetes ([place])   . Diabetic neuropathy ([place])   . Dilated cardiomyopathy ([place]) 11/20/2017  . Edema 10/08/2014  . Heart murmur   . Hypercholesterolemia   . Hypertension    pulmonary  . LVH (left ventricular hypertrophy)   . Lymphoma ([place])   . Metabolic bone disease 2/84/1324  . Mitral regurgitation   . Prostate cancer ([place])   . Pulmonary hypertension ([place])   . Tricuspid regurgitation   . Type 2 diabetes mellitus with neurological complications ([place]) 4/0/1027  . Vitamin D deficiency  12/12/2019  . Volume overload 12/12/2019     Related testing: Retinal exam: Done Pneumovax: done  Objective:  BP 122/78 (BP Location: Left Arm, Patient Position: Sitting, Cuff Size: Normal)   Temp (!) 97.5 F (36.4 C) (Oral)   Ht 5\' 8"  (1.727 m)   Wt 164 lb 2 oz (74.4 kg)   SpO2 92%   BMI 24.96 kg/m  General:  Well developed, well nourished, in no apparent distress Skin:  Warm, no pallor or diaphoresis Head:  Normocephalic, atraumatic Eyes:  Pupils equal and round, sclera anicteric without injection  Lungs:  CTAB, no access msc use Cardio:  RRR, no bruits, + 2+ pitting bilateral LE edema tapering at the knees, + JVD Neuro:  Sensation intact to pinprick on feet Psych: Age appropriate judgment and insight  Assessment:   Type 2 diabetes mellitus with neurological complications (HCC) - Plan: Comprehensive metabolic panel, Hemoglobin A1c, Lipid panel, CANCELED: Comprehensive metabolic panel, CANCELED: Lipid panel, CANCELED: Hemoglobin O5D  Chronic systolic congestive heart failure (HCC)  Need for influenza vaccination - Plan: Flu Vaccine QUAD High Dose(Fluad)   Plan:   1.  Check labs.  Continue diet control approach pending results.  Counseled on diet and exercise. 2.  I will have him take his first dosage of Lasix within 1 hour of waking up.  His second dosage  will be around 6 hours after the initial dose.  Perhaps spreading out will help him diurese enough to feel better.  Send message in 3 to 4 days if no improvement.  Will increase second dose of the day to 120 mg if he is still having issues and I will see him next week. F/u in 6 mo pending above. The patient and his family members voiced understanding and agreement to the plan.  [place], DO 06/21/20 12:01 PM

## 2020-06-24 ENCOUNTER — Other Ambulatory Visit: Payer: Self-pay

## 2020-06-24 ENCOUNTER — Telehealth: Payer: Self-pay | Admitting: Family Medicine

## 2020-06-24 ENCOUNTER — Other Ambulatory Visit: Payer: Medicare HMO

## 2020-06-24 DIAGNOSIS — R069 Unspecified abnormalities of breathing: Secondary | ICD-10-CM

## 2020-06-24 DIAGNOSIS — E1149 Type 2 diabetes mellitus with other diabetic neurological complication: Secondary | ICD-10-CM | POA: Diagnosis not present

## 2020-06-24 DIAGNOSIS — J449 Chronic obstructive pulmonary disease, unspecified: Secondary | ICD-10-CM

## 2020-06-24 NOTE — Telephone Encounter (Signed)
Patient daughter Eric Herring called from [phone] she would like her dad to take the 3 point testing so he can get  a portable oxygen tank    Please Advice

## 2020-06-25 LAB — LIPID PANEL
Cholesterol: 137 mg/dL (ref <200)
HDL: 52 mg/dL (ref >=40)
LDL Cholesterol (Calc): 70 mg/dL (calc)
Non-HDL Cholesterol (Calc): 85 mg/dL (calc) (ref <130)
Total CHOL/HDL Ratio: 2.6 (calc) (ref <5.0)
Triglycerides: 72 mg/dL (ref <150)

## 2020-06-25 LAB — HEMOGLOBIN A1C
Hgb A1c MFr Bld: 5.7 % of total Hgb — ABNORMAL HIGH (ref <5.7)
Mean Plasma Glucose: 117 mg/dL
eAG (mmol/L): 6.5 mmol/L

## 2020-06-25 LAB — COMPREHENSIVE METABOLIC PANEL
AG Ratio: 1.3 (calc) (ref 1.0–2.5)
ALT: 14 U/L (ref 9–46)
AST: 23 U/L (ref 10–35)
Albumin: 4.5 g/dL (ref 3.6–5.1)
Alkaline phosphatase (APISO): 63 U/L (ref 35–144)
BUN/Creatinine Ratio: 14 (calc) (ref 6–22)
BUN: 54 mg/dL — ABNORMAL HIGH (ref 7–25)
CO2: 26 mmol/L (ref 20–32)
Calcium: 9.5 mg/dL (ref 8.6–10.3)
Chloride: 102 mmol/L (ref 98–110)
Creat: 3.89 mg/dL — ABNORMAL HIGH (ref 0.70–1.11)
Globulin: 3.5 g/dL (calc) (ref 1.9–3.7)
Glucose, Bld: 74 mg/dL (ref 65–99)
Potassium: 4.6 mmol/L (ref 3.5–5.3)
Sodium: 138 mmol/L (ref 135–146)
Total Bilirubin: 0.6 mg/dL (ref 0.2–1.2)
Total Protein: 8 g/dL (ref 6.1–8.1)

## 2020-06-25 NOTE — Telephone Encounter (Signed)
I'm not sure what 3 pt testing she is referring. Usually they need a documented oxygen level <87%. Did she want a pulmonology referral? If he is dropping low, we might be able to help. If not, that would be the best team. If he is having breathing issues, I would want to ensure his cardiologist is aware also. Ty.

## 2020-06-25 NOTE — Telephone Encounter (Signed)
Spoke to the daughter informed of PCP instructions. Referred to Pulmonary

## 2020-06-26 IMAGING — US US EXTREM LOW VENOUS BILAT
1 series · 13 of 24 positions shown · non-contrast
Comparison: None.

CLINICAL DATA: 83-year-old male with a history of bilateral lower
leg edema



[Series 1: us extrem low venous bilat · 0.07mm/px · 13 of 50 slices shown]
[im 1/50]
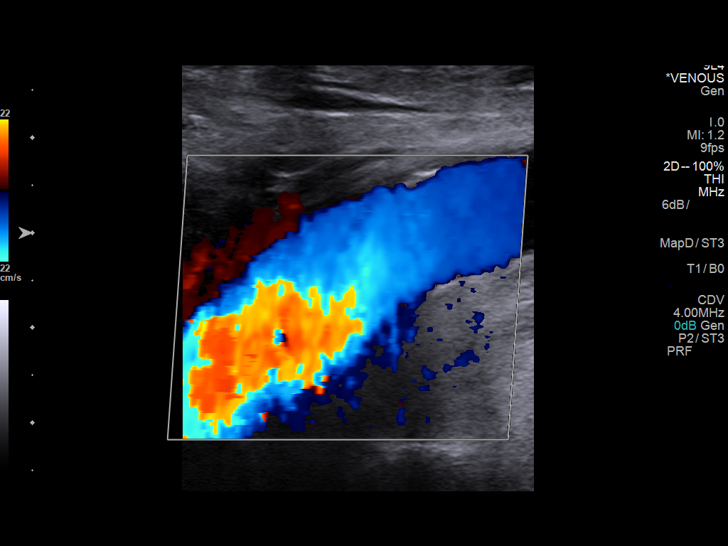
[im 5/50]
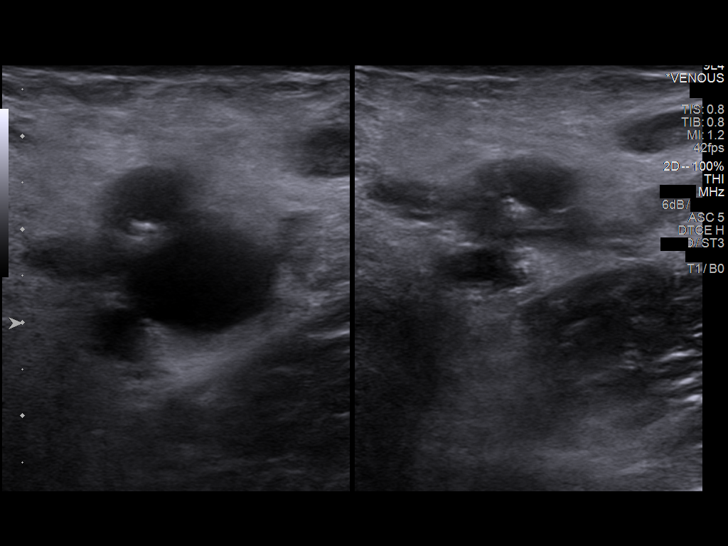
[im 9/50]
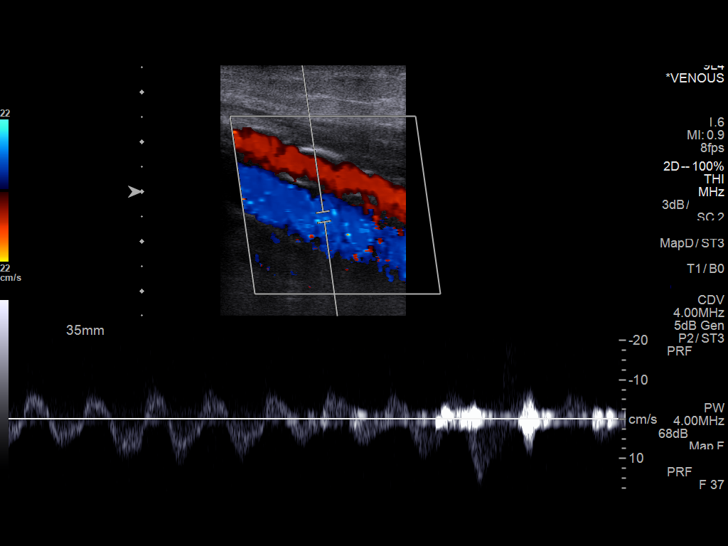
[im 13/50]
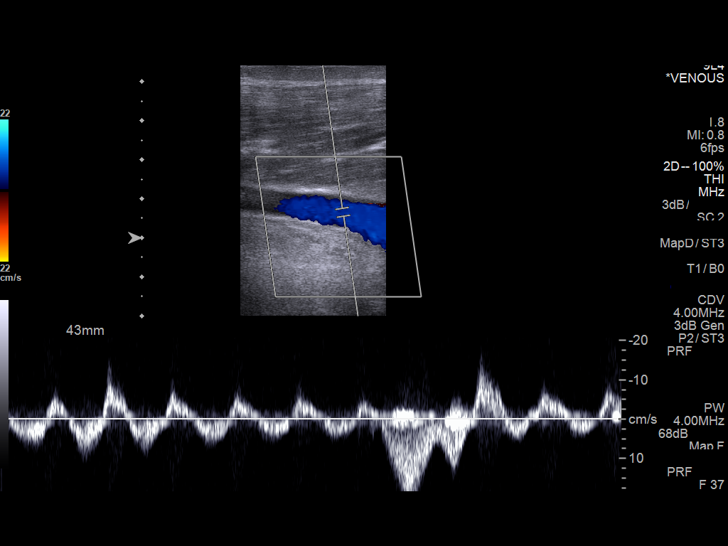
[im 18/50]
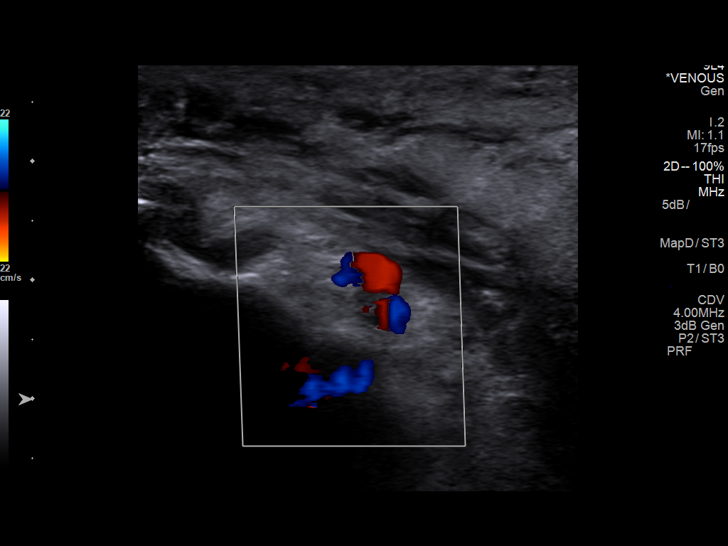
[im 22/50]
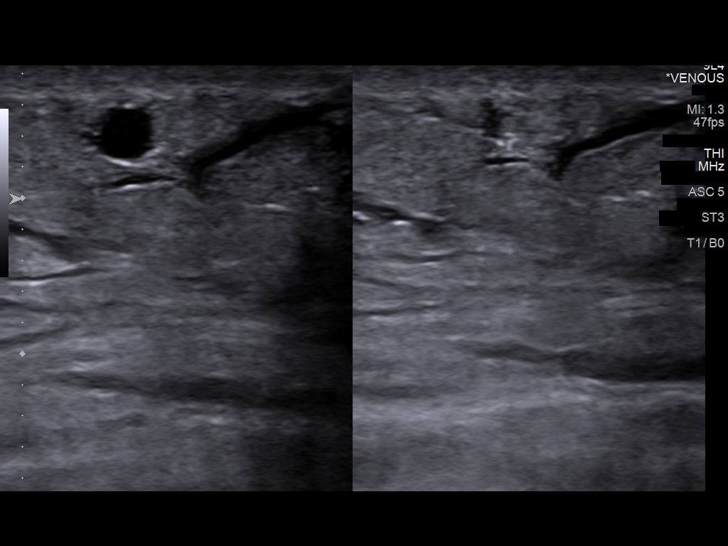
[im 26/50]
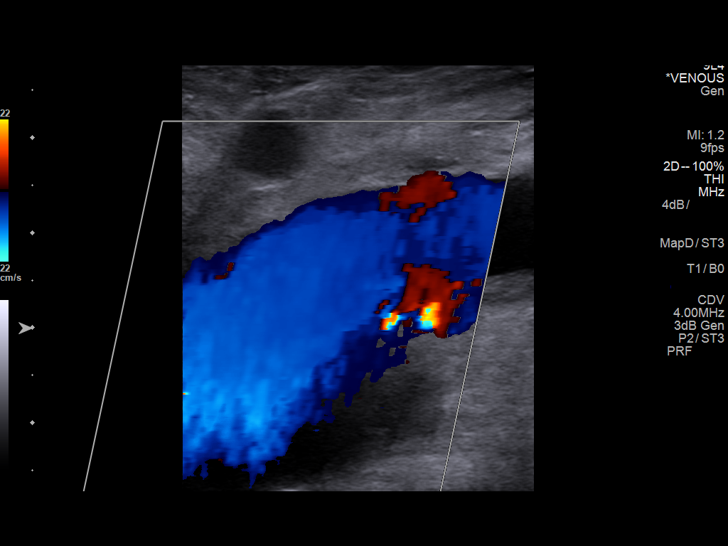
[im 28/50]
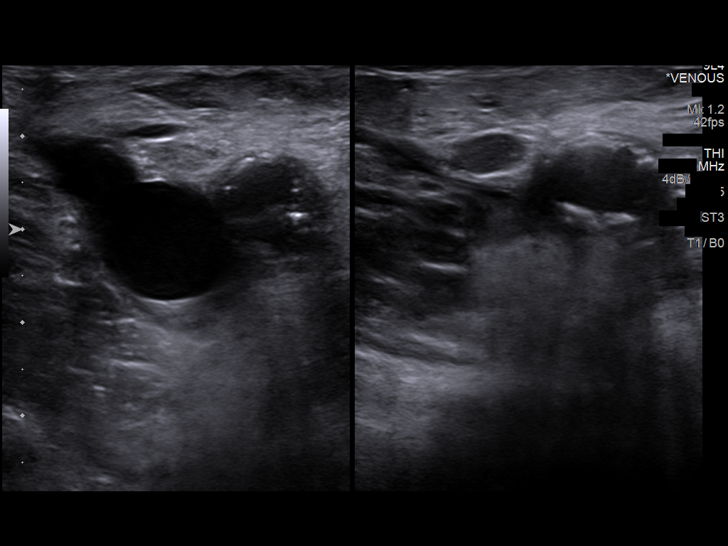
[im 32/50]
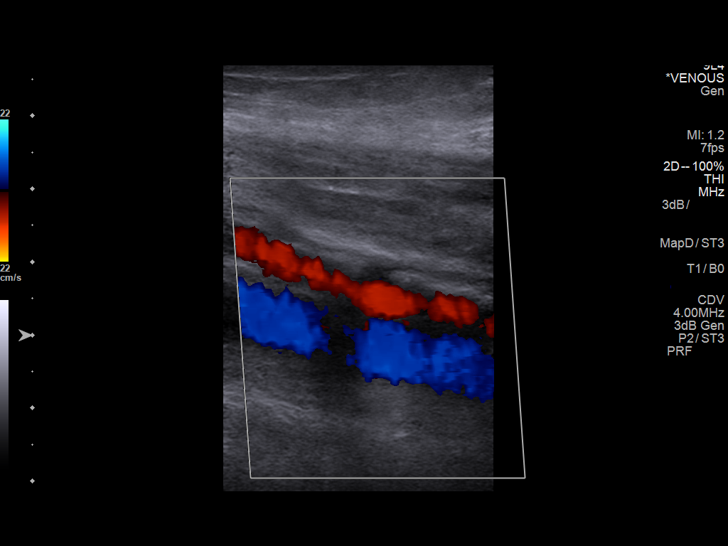
[im 37/50]
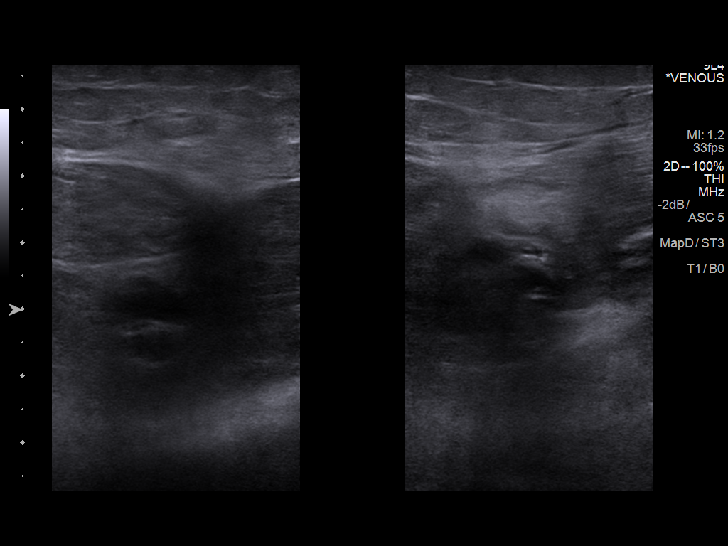
[im 41/50]
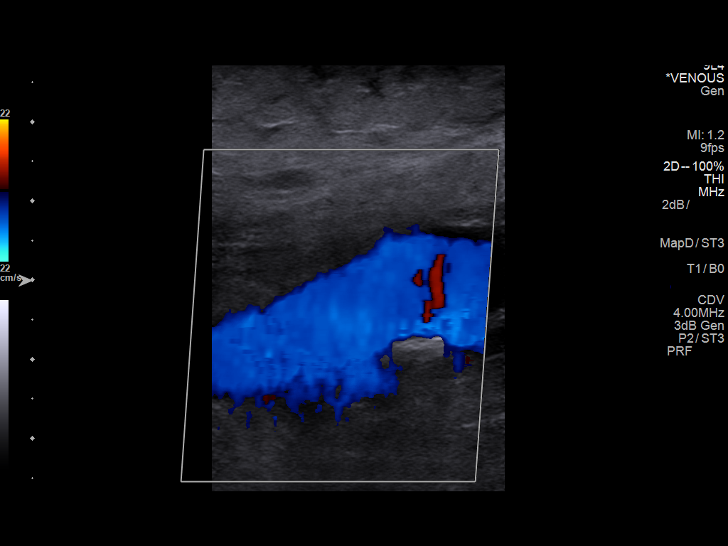
[im 45/50]
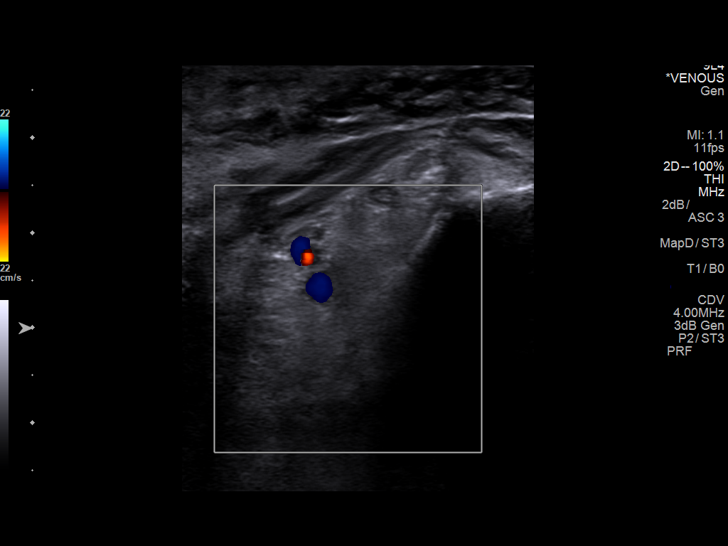
[im 50/50]
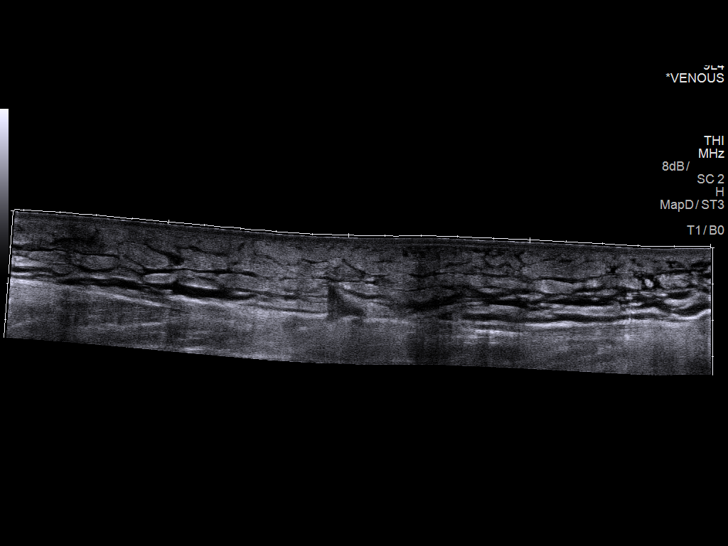

[13 of 24 positions shown; findings below may reference images not displayed]

FINDINGS: RIGHT LOWER EXTREMITY

Common Femoral Vein: No evidence of thrombus. Normal
compressibility, respiratory phasicity and response to augmentation.

Saphenofemoral Junction: No evidence of thrombus. Normal
compressibility and flow on color Doppler imaging.

Profunda Femoral Vein: No evidence of thrombus. Normal
compressibility and flow on color Doppler imaging.

Femoral Vein: No evidence of thrombus. Normal compressibility,
respiratory phasicity and response to augmentation.

Popliteal Vein: No evidence of thrombus. Normal compressibility,
respiratory phasicity and response to augmentation.

Calf Veins: No evidence of thrombus. Normal compressibility and flow
on color Doppler imaging.

Superficial Great Saphenous Vein: No evidence of thrombus. Normal
compressibility and flow on color Doppler imaging

Other Findings:  Edema

LEFT LOWER EXTREMITY

Common Femoral Vein: No evidence of thrombus. Normal
compressibility, respiratory phasicity and response to augmentation.

Saphenofemoral Junction: No evidence of thrombus. Normal
compressibility and flow on color Doppler imaging.

Profunda Femoral Vein: No evidence of thrombus. Normal
compressibility and flow on color Doppler imaging.

Femoral Vein: No evidence of thrombus. Normal compressibility,
respiratory phasicity and response to augmentation.

Popliteal Vein: No evidence of thrombus. Normal compressibility,
respiratory phasicity and response to augmentation.

Calf Veins: No evidence of thrombus. Normal compressibility and flow
on color Doppler imaging.

Superficial Great Saphenous Vein: No evidence of thrombus. Normal
compressibility and flow on color Doppler imaging.

Other Findings:  Edema
IMPRESSION: Sonographic survey of the bilateral lower extremities negative for
DVT.

Bilateral lower extremity edema

## 2020-08-04 ENCOUNTER — Telehealth: Payer: Self-pay | Admitting: Cardiology

## 2020-08-04 NOTE — Telephone Encounter (Signed)
Pt c/o swelling: STAT is pt has developed SOB within 24 hours  1) How much weight have you gained and in what time span? Still in the 160's   2) If swelling, where is the swelling located? Ankles and hands   3) Are you currently taking a fluid pill? Yes   4) Are you currently SOB? No   5) Do you have a log of your daily weights (if so, list)? No   6) Have you gained 3 pounds in a day or 5 pounds in a week? No   7) Have you traveled recently? No  Is having numbness in both hands and ankles as well. Has been going on for a couple of weeks about 2-3. Patient has been scheduled for an appt with Dr. Agustin Cree on 08/13/20 in regards to this.

## 2020-08-04 NOTE — Telephone Encounter (Signed)
Left message to call back  

## 2020-08-05 NOTE — Telephone Encounter (Signed)
Left message for patient to return call.

## 2020-08-06 NOTE — Telephone Encounter (Signed)
Left message for patient to return call.

## 2020-08-09 NOTE — Telephone Encounter (Signed)
Left message for patient to return call.

## 2020-08-12 NOTE — Telephone Encounter (Signed)
Left message for patient to return call.

## 2020-08-13 ENCOUNTER — Other Ambulatory Visit: Payer: Self-pay

## 2020-08-13 ENCOUNTER — Ambulatory Visit: Payer: Medicare HMO | Admitting: Cardiology

## 2020-08-13 ENCOUNTER — Encounter: Payer: Self-pay | Admitting: Cardiology

## 2020-08-13 VITALS — BP 98/58 | HR 50 | Ht 72.0 in | Wt 160.0 lb

## 2020-08-13 DIAGNOSIS — J449 Chronic obstructive pulmonary disease, unspecified: Secondary | ICD-10-CM | POA: Diagnosis not present

## 2020-08-13 DIAGNOSIS — I340 Nonrheumatic mitral (valve) insufficiency: Secondary | ICD-10-CM | POA: Diagnosis not present

## 2020-08-13 DIAGNOSIS — I420 Dilated cardiomyopathy: Secondary | ICD-10-CM | POA: Diagnosis not present

## 2020-08-13 DIAGNOSIS — I5022 Chronic systolic (congestive) heart failure: Secondary | ICD-10-CM

## 2020-08-13 NOTE — Patient Instructions (Signed)

## 2020-08-13 NOTE — Progress Notes (Signed)
Cardiology Office Note:    Date:  08/13/2020   ID:  Eric Herring, DOB 11-Jul-1934, MRN [id number]  PCP:  Shelda Pal, DO  Cardiologist:  Jenne Campus, MD    Referring MD: Shelda Pal*   Chief Complaint  Patient presents with  . Bilateral hands and feet swelling   . Shortness of Breath    History of Present Illness:    Eric Herring is a 86 y.o. male with past medical history significant for cardiomyopathy with severely reduced left ventricle ejection fraction 20 to 25%, diabetes, essential hypertension, chronic renal failure with creatinine in the neighborhood of 3-5, he does not want to be dialyzed.  He comes today 2 months of follow-up overall cardiac wise he seems to be doing quite well there is no swelling of lower extremities, he use elastic stockings and this is under control the biggest complaint he have is swelling of his hands with pain.  This is something that is burning going on for about a year but lately became really troubling to the point that he cannot sleep because of this.  I did look at his hands.  Circulation still to be intact I can feel pulses quite nicely capillary refill is still present and normal.  He does have some swelling of joints with tenderness.  Past Medical History:  Diagnosis Date  . Acute on chronic renal insufficiency 12/11/2017  . Allergy   . Aortic atherosclerosis ([place])   . Arthropathy   . CAD (coronary artery disease)   . Cardiomyopathy ([place])   . Chronic kidney disease   . Chronic renal failure 11/12/2019  . Chronic systolic congestive heart failure ([place]) 01/23/2019  . Colon polyp   . COPD (chronic obstructive pulmonary disease) ([place])   . Diabetes ([place])   . Diabetic neuropathy ([place])   . Dilated cardiomyopathy ([place]) 11/20/2017  . Edema 10/08/2014  . Heart murmur   . Hypercholesterolemia   . Hypertension    pulmonary  . LVH (left ventricular hypertrophy)   . Lymphoma ([place])   . Metabolic bone disease 6/41/5830  .  Mitral regurgitation   . Prostate cancer ([place])   . Pulmonary hypertension ([place])   . Tricuspid regurgitation   . Type 2 diabetes mellitus with neurological complications ([place]) 01/10/767  . Vitamin D deficiency 12/12/2019  . Volume overload 12/12/2019    Past Surgical History:  Procedure Laterality Date  . HEMORROIDECTOMY     pt does not remember year    Current Medications: Current Meds  Medication Sig  . albuterol (PROVENTIL HFA;VENTOLIN HFA) 108 (90 Base) MCG/ACT inhaler Inhale 2 puffs into the lungs every 6 (six) hours as needed for wheezing or shortness of breath.  Marland Kitchen ammonium lactate (LAC-HYDRIN) 12 % cream Apply topically as needed for dry skin. (Patient taking differently: Apply 1 g topically as needed for dry skin.)  . aspirin 81 MG tablet Take 81 mg by mouth daily.  . Calcium Carb-Cholecalciferol (CALCIUM-VITAMIN D) 500-200 MG-UNIT tablet Take 1 tablet by mouth daily.   . fexofenadine (ALLEGRA ALLERGY) 180 MG tablet Take 1 tablet (180 mg total) by mouth daily.  . Fluticasone-Umeclidin-Vilant (TRELEGY ELLIPTA) 100-62.5-25 MCG/INH AEPB Inhale 1 puff into the lungs daily.  . furosemide (LASIX) 80 MG tablet Take one and 1/2 tablet (120 mg ) in the morning and one tablet (80mg  ) in the evening daily. (Patient taking differently: Take 80 mg by mouth 2 (two) times daily. Take one and 1/2 tablet (120 mg ) in the  morning and one tablet (80mg  ) in the evening daily.)  . hydrALAZINE (APRESOLINE) 10 MG tablet Take 1 tablet (10 mg total) by mouth 3 (three) times daily.  . isosorbide mononitrate (IMDUR) 30 MG 24 hr tablet TAKE 1 TABLET EVERY DAY (Patient taking differently: Take 30 mg by mouth daily. Unknown strength)  . Multiple Vitamin (MULTIVITAMIN) tablet Take 1 tablet by mouth.   Marland Kitchen PACERONE 200 MG tablet Take 1 tablet by mouth once daily (Patient taking differently: Take 200 mg by mouth 2 (two) times daily.)  . pravastatin (PRAVACHOL) 20 MG tablet Take 1 tablet (20 mg total) by mouth daily.      Allergies:   Lisinopril   Social History   Socioeconomic History  . Marital status: Married    Spouse name: Not on file  . Number of children: Not on file  . Years of education: Not on file  . Highest education level: Not on file  Occupational History  . Not on file  Tobacco Use  . Smoking status: Former Smoker    Packs/day: 1.00    Years: 25.00    Pack years: 25.00    Types: Cigarettes    Quit date: 11/30/2007    Years since quitting: 12.7  . Smokeless tobacco: Never Used  Vaping Use  . Vaping Use: Never used  Substance and Sexual Activity  . Alcohol use: No  . Drug use: No  . Sexual activity: Not on file  Other Topics Concern  . Not on file  Social History Narrative  . Not on file   Social Determinants of Health   Financial Resource Strain: Not on file  Food Insecurity: Not on file  Transportation Needs: Not on file  Physical Activity: Not on file  Stress: Not on file  Social Connections: Not on file     Family History: The patient's family history includes COPD in his father. ROS:   Please see the history of present illness.    All 14 point review of systems negative except as described per history of present illness  EKGs/Labs/Other Studies Reviewed:      Recent Labs: 06/24/2020: ALT 14; BUN 54; Creat 3.89; Potassium 4.6; Sodium 138  Recent Lipid Panel    Component Value Date/Time   CHOL 137 06/24/2020 1039   TRIG 72 06/24/2020 1039   HDL 52 06/24/2020 1039   CHOLHDL 2.6 06/24/2020 1039   VLDL 17.4 05/23/2019 1144   LDLCALC 70 06/24/2020 1039    Physical Exam:    VS:  BP (!) 98/58 (BP Location: Right Arm, Patient Position: Sitting)   Pulse (!) 50   Ht 6' (1.829 m)   Wt 160 lb (72.6 kg)   SpO2 91%   BMI 21.70 kg/m     Wt Readings from Last 3 Encounters:  08/13/20 160 lb (72.6 kg)  06/21/20 164 lb 2 oz (74.4 kg)  05/05/20 161 lb (73 kg)     GEN:  Well nourished, well developed in no acute distress HEENT: Normal NECK: No JVD; No  carotid bruits LYMPHATICS: No lymphadenopathy CARDIAC: RRR, holosystolic murmur grade 2 through 3/6 best heard left border of sternum, no rubs, no gallops RESPIRATORY:  Clear to auscultation without rales, wheezing or rhonchi  ABDOMEN: Soft, non-tender, non-distended MUSCULOSKELETAL:  No edema; No deformity  SKIN: Warm and dry LOWER EXTREMITIES: 1+ swelling, elastic stockings on NEUROLOGIC:  Alert and oriented x 3 PSYCHIATRIC:  Normal affect   ASSESSMENT:    1. Chronic systolic congestive heart failure ([place])  2. Dilated cardiomyopathy ([place])   3. Nonrheumatic mitral valve regurgitation   4. Chronic obstructive pulmonary disease, unspecified COPD type ([place])    PLAN:    In order of problems listed above:  1. Chronic systolic congestive heart failure he appears to be compensated.  I will continue present medications.  Problem is his blood pressure being low therefore there is no room for additional medications, would complicate the situation 1 more severe kidney dysfunction with creatinine latest number of 3.89 and he refused to be dialyzed.  Overall I will continue present management. 2. Pain in his hands.  I told him to try to put lidocaine ointment also trying to put Voltaren ointment and see if it helps however I recommended to see his primary care physician to talk about it. 3. Dilated cardiomyopathy with the limited options in terms of medication likely hemodynamically he is compensated. 4. Dyslipidemia I did review his K PN which show LDL of 70 HDL of 52.  This is good numbers we will continue present management. 5. Chronic kidney dysfunction with creatinine of 3.89, refused dialysis.   Medication Adjustments/Labs and Tests Ordered: Current medicines are reviewed at length with the patient today.  Concerns regarding medicines are outlined above.  No orders of the defined types were placed in this encounter.  Medication changes: No orders of the defined types were placed in this  encounter.   Signed, Park Liter, MD, [employer] 08/13/2020 2:49 PM    [place]

## 2020-08-13 NOTE — Telephone Encounter (Signed)
Patient here for office visittoday

## 2020-09-29 ENCOUNTER — Ambulatory Visit: Payer: Medicare HMO | Admitting: Cardiology

## 2020-09-29 ENCOUNTER — Other Ambulatory Visit: Payer: Self-pay

## 2020-09-29 ENCOUNTER — Telehealth: Payer: Self-pay

## 2020-09-29 ENCOUNTER — Encounter: Payer: Self-pay | Admitting: Cardiology

## 2020-09-29 VITALS — BP 88/46 | HR 50 | Ht 68.0 in | Wt 155.0 lb

## 2020-09-29 DIAGNOSIS — I420 Dilated cardiomyopathy: Secondary | ICD-10-CM

## 2020-09-29 DIAGNOSIS — N184 Chronic kidney disease, stage 4 (severe): Secondary | ICD-10-CM | POA: Diagnosis not present

## 2020-09-29 DIAGNOSIS — I2510 Atherosclerotic heart disease of native coronary artery without angina pectoris: Secondary | ICD-10-CM

## 2020-09-29 DIAGNOSIS — I5022 Chronic systolic (congestive) heart failure: Secondary | ICD-10-CM

## 2020-09-29 DIAGNOSIS — I272 Pulmonary hypertension, unspecified: Secondary | ICD-10-CM | POA: Diagnosis not present

## 2020-09-29 NOTE — Telephone Encounter (Signed)
Patient is unable to confirm his med list on today's visit.

## 2020-09-29 NOTE — Patient Instructions (Signed)

## 2020-09-29 NOTE — Progress Notes (Signed)
Cardiology Office Note:    Date:  09/29/2020   ID:  Eric Herring, DOB June 03, 1934, MRN [id number]  PCP:  Shelda Pal, DO  Cardiologist:  Jenne Campus, MD    Referring MD: Shelda Pal*   Chief Complaint  Patient presents with  . No cardiac sx's     C/o bilateral hand pain and swelling appearance of RA    History of Present Illness:    Eric Herring is a 86 y.o. male with past medical history significant for cardiomyopathy with severely diminished left ventricle ejection fraction 20 to 25%, diabetes, essential hypertension, chronic renal failure with creatinine in the neighborhood of 3-4, he does not want to be dialyzed.  He comes today 2 months of follow-up overall from cardiac standpoint reviewed he does quite well.  He complained of having pain in his hands and that bother him the most.  Hands are swollen and painful to touch.  Warm with good capillary refill.  Denies have any chest pain tightness squeezing pressure burning chest no swelling of lower extremities.  Past Medical History:  Diagnosis Date  . Acute on chronic renal insufficiency 12/11/2017  . Allergy   . Aortic atherosclerosis ([place])   . Arthropathy   . CAD (coronary artery disease)   . Cardiomyopathy ([place])   . Chronic kidney disease   . Chronic renal failure 11/12/2019  . Chronic systolic congestive heart failure ([place]) 01/23/2019  . Colon polyp   . COPD (chronic obstructive pulmonary disease) ([place])   . Diabetes ([place])   . Diabetic neuropathy ([place])   . Dilated cardiomyopathy ([place]) 11/20/2017  . Edema 10/08/2014  . Heart murmur   . Hypercholesterolemia   . Hypertension    pulmonary  . LVH (left ventricular hypertrophy)   . Lymphoma ([place])   . Metabolic bone disease 4/97/0263  . Mitral regurgitation   . Prostate cancer ([place])   . Pulmonary hypertension ([place])   . Tricuspid regurgitation   . Type 2 diabetes mellitus with neurological complications ([place]) 11/13/5883  . Vitamin D deficiency  12/12/2019  . Volume overload 12/12/2019    Past Surgical History:  Procedure Laterality Date  . HEMORROIDECTOMY     pt does not remember year    Current Medications: No outpatient medications have been marked as taking for the 09/29/20 encounter (Office Visit) with Park Liter, MD.     Allergies:   Lisinopril   Social History   Socioeconomic History  . Marital status: Married    Spouse name: Not on file  . Number of children: Not on file  . Years of education: Not on file  . Highest education level: Not on file  Occupational History  . Not on file  Tobacco Use  . Smoking status: Former Smoker    Packs/day: 1.00    Years: 25.00    Pack years: 25.00    Types: Cigarettes    Quit date: 11/30/2007    Years since quitting: 12.8  . Smokeless tobacco: Never Used  Vaping Use  . Vaping Use: Never used  Substance and Sexual Activity  . Alcohol use: No  . Drug use: No  . Sexual activity: Not on file  Other Topics Concern  . Not on file  Social History Narrative  . Not on file   Social Determinants of Health   Financial Resource Strain: Not on file  Food Insecurity: Not on file  Transportation Needs: Not on file  Physical Activity: Not on file  Stress: Not  on file  Social Connections: Not on file     Family History: The patient's family history includes COPD in his father. ROS:   Please see the history of present illness.    All 14 point review of systems negative except as described per history of present illness  EKGs/Labs/Other Studies Reviewed:      Recent Labs: 06/24/2020: ALT 14; BUN 54; Creat 3.89; Potassium 4.6; Sodium 138  Recent Lipid Panel    Component Value Date/Time   CHOL 137 06/24/2020 1039   TRIG 72 06/24/2020 1039   HDL 52 06/24/2020 1039   CHOLHDL 2.6 06/24/2020 1039   VLDL 17.4 05/23/2019 1144   LDLCALC 70 06/24/2020 1039    Physical Exam:    VS:  BP (!) 88/46 (BP Location: Right Arm, Patient Position: Sitting)   Pulse (!) 50    Ht 5\' 8"  (1.727 m)   Wt 155 lb (70.3 kg)   SpO2 93%   BMI 23.57 kg/m     Wt Readings from Last 3 Encounters:  09/29/20 155 lb (70.3 kg)  08/13/20 160 lb (72.6 kg)  06/21/20 164 lb 2 oz (74.4 kg)     GEN:  Well nourished, well developed in no acute distress HEENT: Normal NECK: No JVD; No carotid bruits LYMPHATICS: No lymphadenopathy CARDIAC: RRR, no murmurs, no rubs, no gallops RESPIRATORY:  Clear to auscultation without rales, wheezing or rhonchi  ABDOMEN: Soft, non-tender, non-distended MUSCULOSKELETAL:  No edema; No deformity  SKIN: Warm and dry LOWER EXTREMITIES: no swelling NEUROLOGIC:  Alert and oriented x 3 PSYCHIATRIC:  Normal affect   ASSESSMENT:    1. Dilated cardiomyopathy ([place])   2. Chronic systolic congestive heart failure ([place])   3. Coronary artery disease involving native coronary artery of native heart without angina pectoris   4. Pulmonary hypertension ([place])   5. Chronic renal failure, stage 4 (severe) ([facility])    PLAN:    In order of problems listed above:  1. Dilated cardiomyopathy on appropriate medication he had that he is able to tolerate the biggest difficulty we have his blood pressure being low that limits the amount of medication I can put him on, luckily, hemodynamically seems to be compensated. 2. Chronic congestive heart failure which is systolic in nature.  Stable not a candidate for ICD secondary to the fact he does not want to be dialyzed and his creatinine is stable over 3-4. 3. Coronary disease stable denies have any chest pain tightness squeezing pressure burning chest. 4. Pulmonary hypertension.  Noted. 5. Chronic kidney failure followed by nephrologist.  Does not want to have dialysis. 6. Both hands pain with some swelling.  I will refer him back to primary care physician to address that issue.   Medication Adjustments/Labs and Tests Ordered: Current medicines are reviewed at length with the patient today.  Concerns regarding medicines  are outlined above.  No orders of the defined types were placed in this encounter.  Medication changes: No orders of the defined types were placed in this encounter.   Signed, Park Liter, MD, [facility] 09/29/2020 11:17 AM    [place]

## 2020-10-01 ENCOUNTER — Encounter: Payer: Self-pay | Admitting: Family Medicine

## 2020-10-01 ENCOUNTER — Ambulatory Visit (INDEPENDENT_AMBULATORY_CARE_PROVIDER_SITE_OTHER): Payer: Medicare HMO | Admitting: Family Medicine

## 2020-10-01 ENCOUNTER — Other Ambulatory Visit: Payer: Self-pay

## 2020-10-01 VITALS — BP 98/64 | HR 71 | Temp 97.6°F | Ht 68.0 in | Wt 151.1 lb

## 2020-10-01 DIAGNOSIS — I700 Atherosclerosis of aorta: Secondary | ICD-10-CM

## 2020-10-01 DIAGNOSIS — J449 Chronic obstructive pulmonary disease, unspecified: Secondary | ICD-10-CM

## 2020-10-01 DIAGNOSIS — G5603 Carpal tunnel syndrome, bilateral upper limbs: Secondary | ICD-10-CM | POA: Diagnosis not present

## 2020-10-01 DIAGNOSIS — M79641 Pain in right hand: Secondary | ICD-10-CM

## 2020-10-01 DIAGNOSIS — M79642 Pain in left hand: Secondary | ICD-10-CM | POA: Diagnosis not present

## 2020-10-01 DIAGNOSIS — C8590 Non-Hodgkin lymphoma, unspecified, unspecified site: Secondary | ICD-10-CM

## 2020-10-01 LAB — URIC ACID: Uric Acid, Serum: 12.2 mg/dL — ABNORMAL HIGH (ref 4.0–7.8)

## 2020-10-01 MED ORDER — TRELEGY ELLIPTA 100-62.5-25 MCG/INH IN AEPB
1.0000 | INHALATION_SPRAY | Freq: Every day | RESPIRATORY_TRACT | 5 refills | Status: DC
Start: 2020-10-01 — End: 2022-11-21

## 2020-10-01 MED ORDER — PREDNISONE 20 MG PO TABS
40.0000 mg | ORAL_TABLET | Freq: Every day | ORAL | 0 refills | Status: AC
Start: 2020-10-01 — End: 2020-10-06

## 2020-10-01 NOTE — Patient Instructions (Signed)
Give [place] 2-3 business days to get the results of your labs back.   Let me know if the inhaler is too expensive and do not fill it.   Wear the splints at night and during aggravating activities.  Ice/cold pack over area for 10-15 min twice daily.  OK to take Tylenol 1000 mg (2 extra strength tabs) or 975 mg (3 regular strength tabs) every 6 hours as needed.  If you do not hear anything about your referral in the next 1-2 weeks, call our office and ask for an update.  Let us know if you need anything.

## 2020-10-01 NOTE — Progress Notes (Signed)
Musculoskeletal Exam  Patient: DEVEN FURIA DOB: 1935/05/06  DOS: 10/01/2020  SUBJECTIVE:  Chief Complaint:   Chief Complaint  Patient presents with  . Edema    Both hands swelling    JENTZEN MINASYAN is a 86 y.o.  male for evaluation and treatment of b/l hand pain.  He is here with his daughter who helps with the history.  Onset:  3 months ago. No inj or change in activity.  Location: bilateral hands  Character:  aching and sharp  Progression of issue:  is unchanged Associated symptoms: swelling, decreased ROM, cannot hold things in his hand Treatment: to date has been ice and acetaminophen.   Neurovascular symptoms: no  COPD The patient has a history of COPD.  He has been short of breath and oxygen saturations have been dropping to the low 90s over the past several months.  He went to his cardiologist earlier in the week who states that this is not likely from the heart.  He has not seen his pulmonologist in over a year.  He used to be on Trelegy but is not currently taking any inhalers.  He is not wheezing but will sometimes cough.  Past Medical History:  Diagnosis Date  . Acute on chronic renal insufficiency 12/11/2017  . Allergy   . Aortic atherosclerosis ([place])   . Arthropathy   . CAD (coronary artery disease)   . Cardiomyopathy ([place])   . Chronic kidney disease   . Chronic renal failure 11/12/2019  . Chronic systolic congestive heart failure ([place]) 01/23/2019  . Colon polyp   . COPD (chronic obstructive pulmonary disease) ([place])   . Diabetes ([place])   . Diabetic neuropathy ([place])   . Dilated cardiomyopathy ([place]) 11/20/2017  . Edema 10/08/2014  . Heart murmur   . Hypercholesterolemia   . Hypertension    pulmonary  . LVH (left ventricular hypertrophy)   . Lymphoma ([place])   . Metabolic bone disease 5/91/6384  . Mitral regurgitation   . Prostate cancer ([place])   . Pulmonary hypertension ([place])   . Tricuspid regurgitation   . Type 2 diabetes mellitus with neurological  complications ([place]) 10/12/5991  . Vitamin D deficiency 12/12/2019  . Volume overload 12/12/2019    Objective: VITAL SIGNS: BP 98/64 (BP Location: Right Arm, Patient Position: Sitting, Cuff Size: Normal)   Pulse 71   Temp 97.6 F (36.4 C) (Oral)   Ht 5\' 8"  (1.727 m)   Wt 151 lb 2 oz (68.5 kg)   SpO2 93%   BMI 22.98 kg/m  Constitutional: Well formed, well developed. No acute distress. Thorax & Lungs: No accessory muscle use Musculoskeletal: hands.   Normal active range of motion: no.   Normal passive range of motion: no Tenderness to palpation: yes, diffuse Deformity: soft tissue swelling noted Ecchymosis: no Tests positive: Tinel's, Phalen's bilaterally Tests negative: None Neurologic: Normal sensory function. Psychiatric: Normal mood.   Assessment:  Bilateral hand pain - Plan: predniSONE (DELTASONE) 20 MG tablet, Uric acid, Ambulatory referral to Hand Surgery  Bilateral carpal tunnel syndrome - Plan: Ambulatory referral to Hand Surgery  Chronic obstructive pulmonary disease, unspecified COPD type ([place]) - Plan: Fluticasone-Umeclidin-Vilant (TRELEGY ELLIPTA) 100-62.5-25 MCG/INH AEPB  Lymphoma, unspecified body region, unspecified lymphoma type ([place]), Chronic  Aortic atherosclerosis ([place]), Chronic  Plan: 1. 5-day prednisone burst, check uric acid, ice, Tylenol.  2.  He has wrist braces at home.  Wear at night and during the day with aggravating activity.  This will be frequently starting now.  Will refer to hand surgery as a contingency. 3.  Chronic, uncontrolled.  Will refill Trelegy.  If not affordable, he will let me know and I will refer him back to the pulmonology team.  The prednisone burst should help. F/u pending the above above. The patient voiced understanding and agreement to the plan.   [place], DO 10/01/20  12:05 PM

## 2020-10-05 ENCOUNTER — Other Ambulatory Visit: Payer: Self-pay | Admitting: Family Medicine

## 2020-10-05 MED ORDER — ALLOPURINOL 100 MG PO TABS: 100.0000 mg | ORAL_TABLET | Freq: Every day | ORAL | 3 refills | Status: DC

## 2020-11-05 ENCOUNTER — Ambulatory Visit: Payer: Medicare HMO | Admitting: Family Medicine

## 2020-11-20 DIAGNOSIS — M25462 Effusion, left knee: Secondary | ICD-10-CM | POA: Diagnosis not present

## 2020-11-20 DIAGNOSIS — J40 Bronchitis, not specified as acute or chronic: Secondary | ICD-10-CM | POA: Diagnosis not present

## 2020-11-20 DIAGNOSIS — Y998 Other external cause status: Secondary | ICD-10-CM | POA: Diagnosis not present

## 2020-11-20 DIAGNOSIS — M7989 Other specified soft tissue disorders: Secondary | ICD-10-CM | POA: Diagnosis not present

## 2020-11-20 DIAGNOSIS — M11262 Other chondrocalcinosis, left knee: Secondary | ICD-10-CM | POA: Diagnosis not present

## 2020-11-20 DIAGNOSIS — W108XXA Fall (on) (from) other stairs and steps, initial encounter: Secondary | ICD-10-CM | POA: Diagnosis not present

## 2020-11-20 DIAGNOSIS — W19XXXA Unspecified fall, initial encounter: Secondary | ICD-10-CM | POA: Diagnosis not present

## 2020-11-20 DIAGNOSIS — S8992XA Unspecified injury of left lower leg, initial encounter: Secondary | ICD-10-CM | POA: Diagnosis not present

## 2020-11-20 DIAGNOSIS — R059 Cough, unspecified: Secondary | ICD-10-CM | POA: Diagnosis not present

## 2020-11-20 DIAGNOSIS — I4510 Unspecified right bundle-branch block: Secondary | ICD-10-CM | POA: Diagnosis not present

## 2020-11-20 DIAGNOSIS — S83012A Lateral subluxation of left patella, initial encounter: Secondary | ICD-10-CM | POA: Diagnosis not present

## 2020-11-20 DIAGNOSIS — M25562 Pain in left knee: Secondary | ICD-10-CM | POA: Diagnosis not present

## 2020-11-20 DIAGNOSIS — R296 Repeated falls: Secondary | ICD-10-CM | POA: Diagnosis not present

## 2020-11-20 DIAGNOSIS — M1712 Unilateral primary osteoarthritis, left knee: Secondary | ICD-10-CM | POA: Diagnosis not present

## 2020-11-20 DIAGNOSIS — R079 Chest pain, unspecified: Secondary | ICD-10-CM | POA: Diagnosis not present

## 2020-11-20 DIAGNOSIS — N184 Chronic kidney disease, stage 4 (severe): Secondary | ICD-10-CM | POA: Diagnosis not present

## 2020-11-20 DIAGNOSIS — R062 Wheezing: Secondary | ICD-10-CM | POA: Diagnosis not present

## 2020-11-20 DIAGNOSIS — R0789 Other chest pain: Secondary | ICD-10-CM | POA: Diagnosis not present

## 2020-11-21 DIAGNOSIS — I491 Atrial premature depolarization: Secondary | ICD-10-CM | POA: Diagnosis not present

## 2020-11-21 DIAGNOSIS — I440 Atrioventricular block, first degree: Secondary | ICD-10-CM | POA: Diagnosis not present

## 2020-11-21 DIAGNOSIS — I4510 Unspecified right bundle-branch block: Secondary | ICD-10-CM | POA: Diagnosis not present

## 2020-11-22 ENCOUNTER — Ambulatory Visit (INDEPENDENT_AMBULATORY_CARE_PROVIDER_SITE_OTHER): Payer: Medicare HMO | Admitting: Family Medicine

## 2020-11-22 ENCOUNTER — Other Ambulatory Visit (INDEPENDENT_AMBULATORY_CARE_PROVIDER_SITE_OTHER): Payer: Medicare HMO

## 2020-11-22 ENCOUNTER — Encounter: Payer: Self-pay | Admitting: Family Medicine

## 2020-11-22 ENCOUNTER — Other Ambulatory Visit: Payer: Self-pay

## 2020-11-22 VITALS — BP 108/60 | HR 102 | Temp 98.3°F

## 2020-11-22 DIAGNOSIS — R531 Weakness: Secondary | ICD-10-CM

## 2020-11-22 DIAGNOSIS — R296 Repeated falls: Secondary | ICD-10-CM | POA: Diagnosis not present

## 2020-11-22 LAB — CBC
HCT: 32.2 % — ABNORMAL LOW (ref 39.0–52.0)
Hemoglobin: 10.9 g/dL — ABNORMAL LOW (ref 13.0–17.0)
MCHC: 33.9 g/dL (ref 30.0–36.0)
MCV: 86 fl (ref 78.0–100.0)
Platelets: 214 K/uL (ref 150.0–400.0)
RBC: 3.74 Mil/uL — ABNORMAL LOW (ref 4.22–5.81)
RDW: 13.6 % (ref 11.5–15.5)
WBC: 6.8 K/uL (ref 4.0–10.5)

## 2020-11-22 LAB — COMPREHENSIVE METABOLIC PANEL
ALT: 19 U/L (ref 0–53)
AST: 26 U/L (ref 0–37)
Albumin: 3.6 g/dL (ref 3.5–5.2)
Alkaline Phosphatase: 69 U/L (ref 39–117)
BUN: 51 mg/dL — ABNORMAL HIGH (ref 6–23)
CO2: 22 mEq/L (ref 19–32)
Calcium: 9.7 mg/dL (ref 8.4–10.5)
Chloride: 101 mEq/L (ref 96–112)
Creatinine, Ser: 2.88 mg/dL — ABNORMAL HIGH (ref 0.40–1.50)
GFR: 19.45 mL/min — ABNORMAL LOW (ref >60.00)
Glucose, Bld: 119 mg/dL — ABNORMAL HIGH (ref 70–99)
Potassium: 4.7 mEq/L (ref 3.5–5.1)
Sodium: 136 mEq/L (ref 135–145)
Total Bilirubin: 0.9 mg/dL (ref 0.2–1.2)
Total Protein: 8.1 g/dL (ref 6.0–8.3)

## 2020-11-22 LAB — TSH: TSH: 75.82 u[IU]/mL — ABNORMAL HIGH (ref 0.35–5.50)

## 2020-11-22 NOTE — Patient Instructions (Addendum)
Give [place] 2-3 business days to get the results of your labs back.   If you do not hear anything about your referral in the next week, call our office and ask for an update.  OK to take Tylenol 1000 mg (2 extra strength tabs) or 975 mg (3 regular strength tabs) every 6 hours as needed.  Ice/cold pack over area for 10-15 min twice daily.  Let us know if you need anything.  Knee Exercises It is normal to feel mild stretching, pulling, tightness, or discomfort as you do these exercises, but you should stop right away if you feel sudden pain or your pain gets worse.  STRETCHING AND RANGE OF MOTION EXERCISES  These exercises warm up your muscles and joints and improve the movement and flexibility of your knee. These exercises also help to relieve pain, numbness, and tingling. Exercise A: Knee Extension, Prone  Lie on your abdomen on a bed. Place your left / right knee just beyond the edge of the surface so your knee is not on the bed. You can put a towel under your left / right thigh just above your knee for comfort. Relax your leg muscles and allow gravity to straighten your knee. You should feel a stretch behind your left / right knee. Hold this position for 30 seconds. Scoot up so your knee is supported between repetitions. Repeat 2 times. Complete this stretch 3 times per week. Exercise B: Knee Flexion, Active     Lie on your back with both knees straight. If this causes back discomfort, bend your left / right knee so your foot is flat on the floor. Slowly slide your left / right heel back toward your buttocks until you feel a gentle stretch in the front of your knee or thigh. Hold this position for 30 seconds. Slowly slide your left / right heel back to the starting position. Repeat 2 times. Complete this exercise 3 times per week. Exercise C: Quadriceps, Prone     Lie on your abdomen on a firm surface, such as a bed or padded floor. Bend your left / right knee and hold your ankle. If  you cannot reach your ankle or pant leg, loop a belt around your foot and grab the belt instead. Gently pull your heel toward your buttocks. Your knee should not slide out to the side. You should feel a stretch in the front of your thigh and knee. Hold this position for 30 seconds. Repeat 2 times. Complete this stretch 3 times per week. Exercise D: Hamstring, Supine  Lie on your back. Loop a belt or towel over the ball of your left / right foot. The ball of your foot is on the walking surface, right under your toes. Straighten your left / right knee and slowly pull on the belt to raise your leg until you feel a gentle stretch behind your knee. Do not let your left / right knee bend while you do this. Keep your other leg flat on the floor. Hold this position for 30 seconds. Repeat 2 times. Complete this stretch 3 times per week. STRENGTHENING EXERCISES  These exercises build strength and endurance in your knee. Endurance is the ability to use your muscles for a long time, even after they get tired. Exercise E: Quadriceps, Isometric     Lie on your back with your left / right leg extended and your other knee bent. Put a rolled towel or small pillow under your knee if told by your health  care provider. Slowly tense the muscles in the front of your left / right thigh. You should see your kneecap slide up toward your hip or see increased dimpling just above the knee. This motion will push the back of the knee toward the floor. For 3 seconds, keep the muscle as tight as you can without increasing your pain. Relax the muscles slowly and completely. Repeat for 10 total reps Repeat 2 ti mes. Complete this exercise 3 times per week. Exercise F: Straight Leg Raises - Quadriceps  Lie on your back with your left / right leg extended and your other knee bent. Tense the muscles in the front of your left / right thigh. You should see your kneecap slide up or see increased dimpling just above the knee. Your  thigh may even shake a bit. Keep these muscles tight as you raise your leg 4-6 inches (10-15 cm) off the floor. Do not let your knee bend. Hold this position for 3 seconds. Keep these muscles tense as you lower your leg. Relax your muscles slowly and completely after each repetition. 10 total reps. Repeat 2 times. Complete this exercise 3 times per week.  Exercise G: Hamstring Curls     If told by your health care provider, do this exercise while wearing ankle weights. Begin with 5 lb weights (optional). Then increase the weight by 1 lb (0.5 kg) increments. Do not wear ankle weights that are more than 20 lbs to start with. Lie on your abdomen with your legs straight. Bend your left / right knee as far as you can without feeling pain. Keep your hips flat against the floor. Hold this position for 3 seconds. Slowly lower your leg to the starting position. Repeat for 10 reps.  Repeat 2 times. Complete this exercise 3 times per week. Exercise H: Squats (Quadriceps)  Stand in front of a table, with your feet and knees pointing straight ahead. You may rest your hands on the table for balance but not for support. Slowly bend your knees and lower your hips like you are going to sit in a chair. Keep your weight over your heels, not over your toes. Keep your lower legs upright so they are parallel with the table legs. Do not let your hips go lower than your knees. Do not bend lower than told by your health care provider. If your knee pain increases, do not bend as low. Hold the squat position for 1 second. Slowly push with your legs to return to standing. Do not use your hands to pull yourself to standing. Repeat 2 times. Complete this exercise 3 times per week. Exercise I: Wall Slides (Quadriceps)     Lean your back against a smooth wall or door while you walk your feet out 18-24 inches (46-61 cm) from it. Place your feet hip-width apart. Slowly slide down the wall or door until your knees  Repeat 2 times. Complete this exercise every other day. Exercise K: Straight Leg Raises - Hip Abductors  Lie on your side with your left / right leg in the top position. Lie so your head, shoulder, knee, and hip line up. You may bend your bottom knee to help you keep your balance. Roll your hips slightly forward so your hips are stacked directly over each other and your left / right knee is facing forward. Leading with your heel, lift your top leg 4-6 inches (10-15 cm). You should feel the muscles in your outer hip lifting. Do not let your  foot drift forward. Do not let your knee roll toward the ceiling. Hold this position for 3 seconds. Slowly return your leg to the starting position. Let your muscles relax completely after each repetition. 10 total reps. Repeat 2 times. Complete this exercise 3 times per week. Exercise J: Straight Leg Raises - Hip Extensors  Lie on your abdomen on a firm surface. You can put a pillow under your hips if that is more comfortable. Tense the muscles in your buttocks and lift your left / right leg about 4-6 inches (10-15 cm). Keep your knee straight as you lift your leg. Hold this position for 3 seconds. Slowly lower your leg to the starting position. Let your leg relax completely after each repetition. Repeat 2 times. Complete this exercise 3 times per week. Document Released: 03/08/2005 Document Revised: 01/17/2016 Document Reviewed: 02/28/2015 Elsevier Interactive Patient Education  2017 [employer].

## 2020-11-22 NOTE — Progress Notes (Signed)
Musculoskeletal Exam  Patient: Eric Herring DOB: 1934/05/31  DOS: 11/22/2020  SUBJECTIVE:  Chief Complaint:   Chief Complaint  Patient presents with   Fall   Ankle Pain   Cough    Cough for 3 weeks    Eric Herring is a 86 y.o.  male for evaluation and treatment of L leg pain.  He is here with his daughter Eric Herring.  Onset:  3 days ago. [place] while doing down the stairs and landed on his knee.  Location: L knee Character:  aching and shooting  Progression of issue:  is unchanged Associated symptoms: swelling Treatment: to date has been oral steroids and bracing.   Neurovascular symptoms: no The patient was seen at the emergency department for this 2 days ago and given prednisone for this and a COPD exacerbation.  His first dose was yesterday and it does help a little.  This is his third fall in the past month.  All the falls were similar in the sense that he was too weak to stay upright.  He is not working with therapy of any sort right now.  He has lost weight.  His appetite is not what it used to be.  He has been drinking protein supplementation shakes.  Things just do not taste is good.  Past Medical History:  Diagnosis Date   Acute on chronic renal insufficiency 12/11/2017   Allergy    Aortic atherosclerosis (HCC)    Arthropathy    CAD (coronary artery disease)    Cardiomyopathy ([place])    Chronic kidney disease    Chronic renal failure 81/44/8185   Chronic systolic congestive heart failure ([place]) 01/23/2019   Colon polyp    COPD (chronic obstructive pulmonary disease) (HCC)    Diabetes (HCC)    Diabetic neuropathy (HCC)    Dilated cardiomyopathy ([place]) 11/20/2017   Heart murmur    Hypercholesterolemia    Hypertension    pulmonary   LVH (left ventricular hypertrophy)    Lymphoma (HCC)    Metabolic bone disease 63/14/9702   Mitral regurgitation    Prostate cancer (HCC)    Pulmonary hypertension (HCC)    Tricuspid regurgitation    Type 2 diabetes mellitus with  neurological complications ([place]) 63/78/5885   Vitamin D deficiency 12/12/2019    Objective: VITAL SIGNS: BP 108/60   Pulse (!) 102   Temp 98.3 F (36.8 C) (Oral)  Constitutional: No acute distress, looking more cachectic than before Thorax & Lungs: No accessory muscle use Musculoskeletal: Left knee.   Normal active range of motion: No, decreased secondary to pain Normal passive range of motion: yes Tenderness to palpation: Tenderness over the patella Deformity: no Ecchymosis: no Neurologic: Non ambulatory; no cerebellar signs Psychiatric: Normal mood.  Assessment:  Frequent falls - Plan: Ambulatory referral to Home Health  Weakness - Plan: CBC, Comprehensive metabolic panel, TSH, Ambulatory referral to [place]: New problem, uncertain prog. Need to get home health PT involved. May have to look into assisted living. I will give him some knee stretches/exercises. Heat, ice, Tylenol.  Ck labs to ensure no metabolic component but could be advancing age with complex chronic issues.  F/u as originally scheduled. The patient and his daughter voiced understanding and agreement to the plan.   [place], DO 11/22/20  10:30 AM

## 2020-11-23 ENCOUNTER — Other Ambulatory Visit: Payer: Self-pay | Admitting: Family Medicine

## 2020-11-23 LAB — T4, FREE: Free T4: 0.26 ng/dL — ABNORMAL LOW (ref 0.60–1.60)

## 2020-11-23 MED ORDER — LEVOTHYROXINE SODIUM 25 MCG PO TABS: 25.0000 ug | ORAL_TABLET | Freq: Every day | ORAL | 3 refills | Status: DC

## 2020-12-17 ENCOUNTER — Telehealth: Payer: Self-pay

## 2020-12-17 ENCOUNTER — Other Ambulatory Visit: Payer: Self-pay | Admitting: Family Medicine

## 2020-12-17 ENCOUNTER — Telehealth: Payer: Medicare HMO | Admitting: Family Medicine

## 2020-12-17 MED ORDER — BENZONATATE 100 MG PO CAPS: 100.0000 mg | ORAL_CAPSULE | Freq: Three times a day (TID) | ORAL | 0 refills | Status: DC | PRN

## 2020-12-17 NOTE — Telephone Encounter (Signed)
Spoke to his sister and she stated he was tested today and was negative. He is currently in [place] but only for a short time. He will be returning to [facility]. Scheduled Video Visit with PCP today for cough.

## 2020-12-17 NOTE — Telephone Encounter (Signed)
Called to cancel appt since patient is out of state.

## 2020-12-17 NOTE — Telephone Encounter (Signed)
I did send something which should be viewed as a temporary sort of measure that won't fix the issue. He will need to see a physician in his new region. Ty.

## 2020-12-17 NOTE — Telephone Encounter (Signed)
Pt's daughter, [name] , called stating pt has had a bad cold, cough for a month and a half and is needing something called in for it.  She had also previously reported pt was away staying with relatives out of state for a while and pt's upcoming appts in August needed to be canceled.  I canceled those appts per her request.  I asked her how pt would get meds if they happened to be called in for pt and she stated they could be "sent to him."  I also asked if pt had been tested for Covid and she said he was tested last month.

## 2020-12-17 NOTE — Telephone Encounter (Signed)
Called informed the patients sister [name] prescription sent in and informed of PCP instructions.  She did verbalize understanding.

## 2020-12-17 NOTE — Telephone Encounter (Signed)
Patient's daughter called back, she was informed that Mr. Lawes cannot be seen if he is out of state. She asked about medicine that could be prescribed for her dad's cough, and she was told that a note was sent to Dr. [name] with the request. She is aware that nothing might be prescribed to the patient since the patient was never seen. She stated she understood.

## 2020-12-17 NOTE — Telephone Encounter (Signed)
Called the patients sister in [place]. [phone] left msg to call back as we need to cancel his appt. Called the patients sister [name] at [phone] informed to let her sister know patients appt needs to be canceled. The sister did ask if anything can be called in for the patients cough to the [facility] in [place]

## 2020-12-21 ENCOUNTER — Encounter: Payer: Medicare HMO | Admitting: Family Medicine

## 2021-01-04 ENCOUNTER — Ambulatory Visit: Payer: Medicare HMO | Admitting: Family Medicine

## 2021-01-17 ENCOUNTER — Telehealth: Payer: Self-pay | Admitting: Family Medicine

## 2021-01-17 NOTE — Telephone Encounter (Signed)
PT yes, HH might be more difficult as it would require a F2F.

## 2021-01-17 NOTE — Telephone Encounter (Signed)
Pt's daughter called regarding his dad's move to [place] for a couple of months. She called to see if [name] could get physical therapy and home health to him while he is in [place]. Please advice.

## 2021-01-18 ENCOUNTER — Telehealth: Payer: Self-pay | Admitting: Family Medicine

## 2021-01-18 NOTE — Telephone Encounter (Signed)
Called left message to call back 

## 2021-01-18 NOTE — Telephone Encounter (Signed)
Informed the daughter that if the patient continues to live in [place] they would need to find a PCP at that location to provide the care the patient needs.

## 2021-01-18 NOTE — Telephone Encounter (Signed)
Are you meaning PT where they transport him somewhere?

## 2021-01-18 NOTE — Telephone Encounter (Signed)
Called to let the family know PCP needs a F2F appt or Video visit, But the patients son is in [place] and we cannot do out of state Video Visits. Informed again that if

## 2021-01-18 NOTE — Telephone Encounter (Signed)
Forms faxed into front office Placed in wendling bin up front  Pt would like copy of forms & them faxed off

## 2021-01-18 NOTE — Telephone Encounter (Signed)
Yes, outpatient PT.

## 2021-01-18 NOTE — Telephone Encounter (Signed)
Called the daughter informed that he would only be able to do is outpatient PT since he is not living in [place], but in [place] now. She verbalized understanding. I did tell the daughter they really need to find a PCP in that area if they plan on him being there. She agreed and would discuss with the family.

## 2021-03-08 ENCOUNTER — Telehealth: Payer: Self-pay | Admitting: Family Medicine

## 2021-03-08 NOTE — Telephone Encounter (Signed)
Left message for patient to call back and schedule Medicare Annual Wellness Visit (AWV) in office.  ° °If not able to come in office, please offer to do virtually or by telephone.  Left office number and my jabber #[phone]. ° °Due for AWVI ° °Please schedule at anytime with Nurse Health Advisor. °  °

## 2021-03-21 ENCOUNTER — Telehealth: Payer: Self-pay | Admitting: *Deleted

## 2021-03-21 ENCOUNTER — Ambulatory Visit (INDEPENDENT_AMBULATORY_CARE_PROVIDER_SITE_OTHER): Payer: Medicare HMO | Admitting: Family Medicine

## 2021-03-21 ENCOUNTER — Other Ambulatory Visit: Payer: Self-pay | Admitting: Family Medicine

## 2021-03-21 ENCOUNTER — Telehealth: Payer: Self-pay | Admitting: Family Medicine

## 2021-03-21 ENCOUNTER — Encounter: Payer: Self-pay | Admitting: Family Medicine

## 2021-03-21 ENCOUNTER — Other Ambulatory Visit: Payer: Self-pay

## 2021-03-21 VITALS — BP 102/64 | Temp 97.9°F | Ht 68.0 in

## 2021-03-21 DIAGNOSIS — E1149 Type 2 diabetes mellitus with other diabetic neurological complication: Secondary | ICD-10-CM

## 2021-03-21 DIAGNOSIS — E039 Hypothyroidism, unspecified: Secondary | ICD-10-CM | POA: Diagnosis not present

## 2021-03-21 DIAGNOSIS — M79641 Pain in right hand: Secondary | ICD-10-CM

## 2021-03-21 DIAGNOSIS — Z23 Encounter for immunization: Secondary | ICD-10-CM

## 2021-03-21 DIAGNOSIS — M25562 Pain in left knee: Secondary | ICD-10-CM

## 2021-03-21 LAB — COMPREHENSIVE METABOLIC PANEL
ALT: 59 U/L — ABNORMAL HIGH (ref 0–53)
AST: 72 U/L — ABNORMAL HIGH (ref 0–37)
Albumin: 3.9 g/dL (ref 3.5–5.2)
Alkaline Phosphatase: 103 U/L (ref 39–117)
BUN: 85 mg/dL (ref 6–23)
CO2: 31 mEq/L (ref 19–32)
Calcium: 9.4 mg/dL (ref 8.4–10.5)
Chloride: 92 mEq/L — ABNORMAL LOW (ref 96–112)
Creatinine, Ser: 4.85 mg/dL (ref 0.40–1.50)
GFR: 10.38 mL/min — CL (ref >60.00)
Glucose, Bld: 89 mg/dL (ref 70–99)
Potassium: 3.9 mEq/L (ref 3.5–5.1)
Sodium: 134 mEq/L — ABNORMAL LOW (ref 135–145)
Total Bilirubin: 0.6 mg/dL (ref 0.2–1.2)
Total Protein: 8.2 g/dL (ref 6.0–8.3)

## 2021-03-21 LAB — LIPID PANEL
Cholesterol: 165 mg/dL (ref 0–200)
HDL: 38.3 mg/dL — ABNORMAL LOW (ref >39.00)
LDL Cholesterol: 97 mg/dL (ref 0–99)
NonHDL: 126.91
Total CHOL/HDL Ratio: 4
Triglycerides: 149 mg/dL (ref 0.0–149.0)
VLDL: 29.8 mg/dL (ref 0.0–40.0)

## 2021-03-21 LAB — HEMOGLOBIN A1C: Hgb A1c MFr Bld: 6.2 % (ref 4.6–6.5)

## 2021-03-21 LAB — T4, FREE: Free T4: 0.19 ng/dL — ABNORMAL LOW (ref 0.60–1.60)

## 2021-03-21 LAB — MICROALBUMIN / CREATININE URINE RATIO
Creatinine,U: 113.7 mg/dL
Microalb Creat Ratio: 1.5 mg/g (ref 0.0–30.0)
Microalb, Ur: 1.7 mg/dL (ref 0.0–1.9)

## 2021-03-21 LAB — TSH: TSH: 120.4 u[IU]/mL — ABNORMAL HIGH (ref 0.35–5.50)

## 2021-03-21 MED ORDER — LEVOTHYROXINE SODIUM 75 MCG PO TABS: 75.0000 ug | ORAL_TABLET | Freq: Every day | ORAL | 3 refills | Status: DC

## 2021-03-21 MED ORDER — TRAMADOL HCL 50 MG PO TABS: 50.0000 mg | ORAL_TABLET | Freq: Two times a day (BID) | ORAL | 0 refills | Status: AC | PRN

## 2021-03-21 NOTE — Telephone Encounter (Signed)
Pt's daughter stated wanted to mention during his visit today about his knee swollen and his arthritis. Pt's daughter was informed that he would need another appt. Pt's daughter still wanted message sent to provider- Pt is also aware can do a virtual visit. Please advise.

## 2021-03-21 NOTE — Progress Notes (Signed)
Musculoskeletal Exam  Patient: Eric Herring DOB: 09-04-1934  DOS: 03/21/2021  SUBJECTIVE:  Chief Complaint:   Chief Complaint  Patient presents with   Follow-up    Eric Herring is a 86 y.o.  male for evaluation and treatment of L knee pain. Here w fam member who helps provide hx.   Onset:  2 weeks ago. Fell as flip flops got caught while walking in his home Location: L knee and R hand Character:  aching  Progression of issue:  is unchanged Associated symptoms: swelling, decreased ROM of hand Treatment: to date has been rest.   Neurovascular symptoms: no  Hypothyroidism Patient presents for follow-up of hypothyroidism.  Reports compliance with medication- Synthroid 25 mcg/d. Having some fatigue He believes his dose should be increased   DM II Pt does not routinely monitor his sugars. He is not on insulin.  He is due for his eye exam Diet is fair.  He does not do much walking after falling. He's diet controlled. He is on pravastatin 20 mg/d.   Past Medical History:  Diagnosis Date   Acute on chronic renal insufficiency 12/11/2017   Allergy    Aortic atherosclerosis (HCC)    Arthropathy    CAD (coronary artery disease)    Cardiomyopathy ([place])    Chronic kidney disease    Chronic renal failure 36/14/4315   Chronic systolic congestive heart failure ([place]) 01/23/2019   Colon polyp    COPD (chronic obstructive pulmonary disease) (HCC)    Diabetes (HCC)    Diabetic neuropathy (HCC)    Dilated cardiomyopathy ([place]) 11/20/2017   Heart murmur    Hypercholesterolemia    Hypertension    pulmonary   LVH (left ventricular hypertrophy)    Lymphoma (HCC)    Metabolic bone disease 40/12/6759   Mitral regurgitation    Prostate cancer (HCC)    Pulmonary hypertension (HCC)    Tricuspid regurgitation    Type 2 diabetes mellitus with neurological complications ([place]) 95/01/3266   Vitamin D deficiency 12/12/2019    Objective: VITAL SIGNS: BP 102/64   Temp 97.9 F  (36.6 C) (Oral)   Ht 5\' 8"  (1.727 m)   SpO2 98%   BMI 22.98 kg/m  Constitutional: Well formed, well developed. No acute distress. Thorax & Lungs: No accessory muscle use Musculoskeletal: R hand, L knee.   Normal active range of motion: no.   Normal passive range of motion: no Tenderness to palpation: yes over the DIP palmar  Deformity: no Ecchymosis: no Tests positive: none There is a small left knee joint effusion Over the right hand there is pitting edema of the digits and distal palm; very mild tenderness palpation over the volar aspects of the second-fourth digits.  Over the second and third digit there is slight pain with forced extension of the PIP and DIP. Tests negative: patellar app/grind, Lachman's, varus/valgus stress, stine's Neurologic: Normal sensory function. No focal deficits noted. DTR's equal and symmetric in LE's. No clonus. Psychiatric: Normal mood. Age appropriate judgment and insight. Alert & oriented x 3.    Assessment:  Pain of right hand - Plan: traMADol (ULTRAM) 50 MG tablet  Acute pain of left knee - Plan: traMADol (ULTRAM) 50 MG tablet  Hypothyroidism, unspecified type - Plan: TSH, T4, free  Type 2 diabetes mellitus with neurological complications ([place]) - Plan: Hemoglobin A1c, Comprehensive metabolic panel, Lipid panel, Microalbumin / creatinine urine ratio  Need for influenza vaccination - Plan: Flu Vaccine QUAD High Dose(Fluad)  Need for vaccination against  Streptococcus pneumoniae - Plan: Pneumococcal conjugate vaccine 20-valent (Prevnar 20)  Plan: 1/2. Stretches/exercises, heat, ice, Tylenol.  Add tramadol as he is not a good candidate for NSAIDs.  Will consider sports med referral versus occupational therapy if no improvement. 3.  Chronic, based off of symptoms unstable.  For now continue levothyroxine 25 mcg daily.  Check TSH and free T4. 4.  Chronic, stable.  Continue diet control, check labs.  Continue statin. F/u pending results. The  patient and fam member voiced understanding and agreement to the plan.   [place], DO 03/21/21  11:53 AM

## 2021-03-21 NOTE — Telephone Encounter (Signed)
We addressed the knee and the tx rec'd for the acute issue can also help with the chronic arthritis. If no better in the next few weeks, I can see him again if he is in the area. Ty.

## 2021-03-21 NOTE — Telephone Encounter (Signed)
CRITICAL VALUE STICKER  CRITICAL VALUE: BUN -- 85  , Creat -- 4.85 , Gfr -- 10.38  RECEIVER (on-site recipient of call): Kelle Darting, [place] NOTIFIED: 03/21/21 @ 2:24pm  MESSENGER (representative from lab):  [place]  MD NOTIFIED:  [name]  TIME OF NOTIFICATION: 2:25pm  RESPONSE:

## 2021-03-21 NOTE — Patient Instructions (Addendum)
Ice/cold pack over area for 10-15 min twice daily.  OK to take Tylenol 1000 mg (2 extra strength tabs) or 975 mg (3 regular strength tabs) every 6 hours as needed.  Heat (pad or rice pillow in microwave) over affected area, 10-15 minutes twice daily.   Give [place] 2-3 business days to get the results of your labs back.   Consider Voltaren gel on the knee/hand that is available over the counter.   Send me a message in 3 weeks if the knee/hand are no better. I will place a referral to the sports medicine team.   The new Shingrix vaccine (for shingles) is a 2 shot series. It can make people feel low energy, achy and almost like they have the flu for 48 hours after injection. Please plan accordingly when deciding on when to get this shot. Call our office for a nurse visit appointment to get this. The second shot of the series is less severe regarding the side effects, but it still lasts 48 hours.   I recommend getting the updated bivalent covid vaccination booster at your convenience.   Let us know if you need anything.  Hand Exercises Hand exercises can be helpful for almost anyone. These exercises can strengthen the hands, improve flexibility and movement, and increase blood flow to the hands. These results can make work and daily tasks easier. Hand exercises can be especially helpful for people who have joint pain from arthritis or have nerve damage from overuse (carpal tunnel syndrome). These exercises can also help people who have injured a hand. Exercises Most of these hand exercises are gentle stretching and motion exercises. It is usually safe to do them often throughout the day. Warming up your hands before exercise may help to reduce stiffness. You can do this with gentle massage or by placing your hands in warm water for 10-15 minutes. It is normal to feel some stretching, pulling, tightness, or mild discomfort as you begin new exercises. This will gradually improve. Stop an exercise right  away if you feel sudden, severe pain or your pain gets worse. Ask your health care provider which exercises are best for you. Knuckle bend or "claw" fist Stand or sit with your arm, hand, and all five fingers pointed straight up. Make sure to keep your wrist straight during the exercise. Gently bend your fingers down toward your palm until the tips of your fingers are touching the top of your palm. Keep your big knuckle straight and just bend the small knuckles in your fingers. Hold this position for 3 seconds. Straighten (extend) your fingers back to the starting position. Repeat this exercise 5-10 times with each hand. Full finger fist Stand or sit with your arm, hand, and all five fingers pointed straight up. Make sure to keep your wrist straight during the exercise. Gently bend your fingers into your palm until the tips of your fingers are touching the middle of your palm. Hold this position for 3 seconds. Extend your fingers back to the starting position, stretching every joint fully. Repeat this exercise 5-10 times with each hand. Straight fist Stand or sit with your arm, hand, and all five fingers pointed straight up. Make sure to keep your wrist straight during the exercise. Gently bend your fingers at the big knuckle, where your fingers meet your hand, and the middle knuckle. Keep the knuckle at the tips of your fingers straight and try to touch the bottom of your palm. Hold this position for 3 seconds. Extend  your fingers back to the starting position, stretching every joint fully. Repeat this exercise 5-10 times with each hand. Tabletop Stand or sit with your arm, hand, and all five fingers pointed straight up. Make sure to keep your wrist straight during the exercise. Gently bend your fingers at the big knuckle, where your fingers meet your hand, as far down as you can while keeping the small knuckles in your fingers straight. Think of forming a tabletop with your fingers. Hold this  position for 3 seconds. Extend your fingers back to the starting position, stretching every joint fully. Repeat this exercise 5-10 times with each hand. Finger spread Place your hand flat on a table with your palm facing down. Make sure your wrist stays straight as you do this exercise. Spread your fingers and thumb apart from each other as far as you can until you feel a gentle stretch. Hold this position for 3 seconds. Bring your fingers and thumb tight together again. Hold this position for 3 seconds. Repeat this exercise 5-10 times with each hand. Making circles Stand or sit with your arm, hand, and all five fingers pointed straight up. Make sure to keep your wrist straight during the exercise. Make a circle by touching the tip of your thumb to the tip of your index finger. Hold for 3 seconds. Then open your hand wide. Repeat this motion with your thumb and each finger on your hand. Repeat this exercise 5-10 times with each hand. Thumb motion Sit with your forearm resting on a table and your wrist straight. Your thumb should be facing up toward the ceiling. Keep your fingers relaxed as you move your thumb. Lift your thumb up as high as you can toward the ceiling. Hold for 3 seconds. Bend your thumb across your palm as far as you can, reaching the tip of your thumb for the small finger (pinkie) side of your palm. Hold for 3 seconds. Repeat this exercise 5-10 times with each hand. Grip strengthening  Hold a stress ball or other soft ball in the middle of your hand. Slowly increase the pressure, squeezing the ball as much as you can without causing pain. Think of bringing the tips of your fingers into the middle of your palm. All of your finger joints should bend when doing this exercise. Hold your squeeze for 3 seconds, then relax. Repeat this exercise 5-10 times with each hand. Contact a health care provider if: Your hand pain or discomfort gets much worse when you do an exercise. Your  hand pain or discomfort does not improve within 2 hours after you exercise. If you have any of these problems, stop doing these exercises right away. Do not do them again unless your health care provider says that you can. Get help right away if: You develop sudden, severe hand pain or swelling. If this happens, stop doing these exercises right away. Do not do them again unless your health care provider says that you can. Make sure you discuss any questions you have with your health care provider. Document Revised: 08/15/2018 Document Reviewed: 04/25/2018 Elsevier Patient Education  [place].  Knee Exercises It is normal to feel mild stretching, pulling, tightness, or discomfort as you do these exercises, but you should stop right away if you feel sudden pain or your pain gets worse.  STRETCHING AND RANGE OF MOTION EXERCISES  These exercises warm up your muscles and joints and improve the movement and flexibility of your knee. These exercises also help to  relieve pain, numbness, and tingling. Exercise A: Knee Extension, Prone  Lie on your abdomen on a bed. Place your left / right knee just beyond the edge of the surface so your knee is not on the bed. You can put a towel under your left / right thigh just above your knee for comfort. Relax your leg muscles and allow gravity to straighten your knee. You should feel a stretch behind your left / right knee. Hold this position for 30 seconds. Scoot up so your knee is supported between repetitions. Repeat 2 times. Complete this stretch 3 times per week. Exercise B: Knee Flexion, Active     Lie on your back with both knees straight. If this causes back discomfort, bend your left / right knee so your foot is flat on the floor. Slowly slide your left / right heel back toward your buttocks until you feel a gentle stretch in the front of your knee or thigh. Hold this position for 30 seconds. Slowly slide your left / right heel back to the  starting position. Repeat 2 times. Complete this exercise 3 times per week. Exercise C: Quadriceps, Prone     Lie on your abdomen on a firm surface, such as a bed or padded floor. Bend your left / right knee and hold your ankle. If you cannot reach your ankle or pant leg, loop a belt around your foot and grab the belt instead. Gently pull your heel toward your buttocks. Your knee should not slide out to the side. You should feel a stretch in the front of your thigh and knee. Hold this position for 30 seconds. Repeat 2 times. Complete this stretch 3 times per week. Exercise D: Hamstring, Supine  Lie on your back. Loop a belt or towel over the ball of your left / right foot. The ball of your foot is on the walking surface, right under your toes. Straighten your left / right knee and slowly pull on the belt to raise your leg until you feel a gentle stretch behind your knee. Do not let your left / right knee bend while you do this. Keep your other leg flat on the floor. Hold this position for 30 seconds. Repeat 2 times. Complete this stretch 3 times per week. STRENGTHENING EXERCISES  These exercises build strength and endurance in your knee. Endurance is the ability to use your muscles for a long time, even after they get tired. Exercise E: Quadriceps, Isometric     Lie on your back with your left / right leg extended and your other knee bent. Put a rolled towel or small pillow under your knee if told by your health care provider. Slowly tense the muscles in the front of your left / right thigh. You should see your kneecap slide up toward your hip or see increased dimpling just above the knee. This motion will push the back of the knee toward the floor. For 3 seconds, keep the muscle as tight as you can without increasing your pain. Relax the muscles slowly and completely. Repeat for 10 total reps Repeat 2 ti mes. Complete this exercise 3 times per week. Exercise F: Straight Leg Raises -  Quadriceps  Lie on your back with your left / right leg extended and your other knee bent. Tense the muscles in the front of your left / right thigh. You should see your kneecap slide up or see increased dimpling just above the knee. Your thigh may even shake a bit. Keep  these muscles tight as you raise your leg 4-6 inches (10-15 cm) off the floor. Do not let your knee bend. Hold this position for 3 seconds. Keep these muscles tense as you lower your leg. Relax your muscles slowly and completely after each repetition. 10 total reps. Repeat 2 times. Complete this exercise 3 times per week.  Exercise G: Hamstring Curls     If told by your health care provider, do this exercise while wearing ankle weights. Begin with 5 lb weights (optional). Then increase the weight by 1 lb (0.5 kg) increments. Do not wear ankle weights that are more than 20 lbs to start with. Lie on your abdomen with your legs straight. Bend your left / right knee as far as you can without feeling pain. Keep your hips flat against the floor. Hold this position for 3 seconds. Slowly lower your leg to the starting position. Repeat for 10 reps.  Repeat 2 times. Complete this exercise 3 times per week. Exercise H: Squats (Quadriceps)  Stand in front of a table, with your feet and knees pointing straight ahead. You may rest your hands on the table for balance but not for support. Slowly bend your knees and lower your hips like you are going to sit in a chair. Keep your weight over your heels, not over your toes. Keep your lower legs upright so they are parallel with the table legs. Do not let your hips go lower than your knees. Do not bend lower than told by your health care provider. If your knee pain increases, do not bend as low. Hold the squat position for 1 second. Slowly push with your legs to return to standing. Do not use your hands to pull yourself to standing. Repeat 2 times. Complete this exercise 3 times per  week. Exercise I: Wall Slides (Quadriceps)     Lean your back against a smooth wall or door while you walk your feet out 18-24 inches (46-61 cm) from it. Place your feet hip-width apart. Slowly slide down the wall or door until your knees Repeat 2 times. Complete this exercise every other day. Exercise K: Straight Leg Raises - Hip Abductors  Lie on your side with your left / right leg in the top position. Lie so your head, shoulder, knee, and hip line up. You may bend your bottom knee to help you keep your balance. Roll your hips slightly forward so your hips are stacked directly over each other and your left / right knee is facing forward. Leading with your heel, lift your top leg 4-6 inches (10-15 cm). You should feel the muscles in your outer hip lifting. Do not let your foot drift forward. Do not let your knee roll toward the ceiling. Hold this position for 3 seconds. Slowly return your leg to the starting position. Let your muscles relax completely after each repetition. 10 total reps. Repeat 2 times. Complete this exercise 3 times per week. Exercise J: Straight Leg Raises - Hip Extensors  Lie on your abdomen on a firm surface. You can put a pillow under your hips if that is more comfortable. Tense the muscles in your buttocks and lift your left / right leg about 4-6 inches (10-15 cm). Keep your knee straight as you lift your leg. Hold this position for 3 seconds. Slowly lower your leg to the starting position. Let your leg relax completely after each repetition. Repeat 2 times. Complete this exercise 3 times per week. Document Released: 03/08/2005 Document Revised: 01/17/2016  Document Reviewed: 02/28/2015 Elsevier Interactive Patient Education  2017 [employer].

## 2021-03-21 NOTE — Telephone Encounter (Signed)
Called the daughter informed of PCP response.

## 2021-03-30 NOTE — Telephone Encounter (Signed)
See result note.  

## 2021-04-04 ENCOUNTER — Ambulatory Visit: Payer: Medicare HMO | Admitting: Cardiology

## 2021-04-11 ENCOUNTER — Encounter: Payer: Medicare HMO | Admitting: Family Medicine

## 2021-05-17 ENCOUNTER — Ambulatory Visit (INDEPENDENT_AMBULATORY_CARE_PROVIDER_SITE_OTHER): Payer: Medicare HMO | Admitting: Family Medicine

## 2021-05-17 ENCOUNTER — Encounter: Payer: Self-pay | Admitting: Family Medicine

## 2021-05-17 VITALS — BP 98/60 | HR 82 | Temp 97.5°F

## 2021-05-17 DIAGNOSIS — R5381 Other malaise: Secondary | ICD-10-CM

## 2021-05-17 DIAGNOSIS — E039 Hypothyroidism, unspecified: Secondary | ICD-10-CM | POA: Diagnosis not present

## 2021-05-17 DIAGNOSIS — M79641 Pain in right hand: Secondary | ICD-10-CM

## 2021-05-17 DIAGNOSIS — M1A9XX0 Chronic gout, unspecified, without tophus (tophi): Secondary | ICD-10-CM

## 2021-05-17 LAB — T4, FREE: Free T4: 0.11 ng/dL — ABNORMAL LOW (ref 0.60–1.60)

## 2021-05-17 LAB — URIC ACID: Uric Acid, Serum: 6.8 mg/dL (ref 4.0–7.8)

## 2021-05-17 LAB — TSH: TSH: 115.59 u[IU]/mL — ABNORMAL HIGH (ref 0.35–5.50)

## 2021-05-17 MED ORDER — METHYLPREDNISOLONE ACETATE 80 MG/ML IJ SUSP
80.0000 mg | Freq: Once | INTRAMUSCULAR | Status: AC
  Administered 2021-05-17: 80 mg via INTRAMUSCULAR

## 2021-05-17 NOTE — Progress Notes (Signed)
Chief Complaint  Patient presents with   Follow-up    Subjective: Patient is a 87 y.o. male here for f/u. Here w his daughter.   Hypothyroidism Patient presents for follow-up of hypothyroidism.  Reports compliance with medication-levothyroxine 75 mcg daily, recently increased from 50 mcg daily. Current symptoms include: fatigue and feeling slow Denies: weight gain, constipation, losing hair, anxiousness, feeling excessive energy, and palpitations He believes his dose should be unchanged  Patient continues to have chronic bilateral hand pain that is worse than the right.  He was referred to hand surgery team over 6 months ago but moved down to [place] and was lost to follow-up.  He moved back several days ago and is interested in seeing them again.  No recent injury or change in activity.  He is having some swelling without redness, bruising.  He has lost range of motion in his fingers.  Past Medical History:  Diagnosis Date   Acute on chronic renal insufficiency 12/11/2017   Allergy    Aortic atherosclerosis (HCC)    Arthropathy    CAD (coronary artery disease)    Cardiomyopathy ([place])    Chronic kidney disease    Chronic renal failure 96/75/9163   Chronic systolic congestive heart failure ([place]) 01/23/2019   Colon polyp    COPD (chronic obstructive pulmonary disease) (HCC)    Diabetes (HCC)    Diabetic neuropathy (HCC)    Dilated cardiomyopathy ([place]) 11/20/2017   Heart murmur    Hypercholesterolemia    Hypertension    pulmonary   LVH (left ventricular hypertrophy)    Lymphoma (HCC)    Metabolic bone disease 84/66/5993   Mitral regurgitation    Prostate cancer (HCC)    Pulmonary hypertension (HCC)    Tricuspid regurgitation    Type 2 diabetes mellitus with neurological complications ([place]) 57/05/7791   Vitamin D deficiency 12/12/2019    Objective: BP 98/60    Pulse 82    Temp (!) 97.5 F (36.4 C) (Oral)    SpO2 94%  General: Awake, appears stated age Heart: RRR, no LE  edema Lungs: CTAB, no rales, wheezes or rhonchi. No accessory muscle use MSK: +TTP over the MCP and PIP joints of the second through fourth digits bilaterally but worse on the right; thickening of the joint noted of that second and third MCP on the right and left.  He has noted decreased range of motion with flexion of his fingers which also elucidates pain. Psych:  normal affect and mood  Assessment and Plan: Hypothyroidism, unspecified type - Plan: TSH, T4, free  Physical deconditioning - Plan: Ambulatory referral to [place]  Pain of right hand - Plan: Ambulatory referral to Hand Surgery, methylPREDNISolone acetate (DEPO-MEDROL) injection 80 mg  Chronic gout without tophus, unspecified cause, unspecified site - Plan: Uric acid, methylPREDNISolone acetate (DEPO-MEDROL) injection 80 mg  Chronic, unsure if stable.  Continue levothyroxine 75 mcg daily for now.  Check thyroid levels. Refer to home health physical therapy to help with this. Chronic, uncontrolled, refer to home health occupational therapy and hand, check uric acid levels.  Depo-Medrol injection today.  He has definitely lost range of motion. Follow-up pending lab results. The patient and his daughter voiced understanding and agreement to the plan.  [place], DO 05/17/21  11:59 AM

## 2021-05-17 NOTE — Patient Instructions (Addendum)
Give [place] 2-3 business days to get the results of your labs back.   If you do not hear anything about your referrals in the next few days, call our office and ask for an update.  Keep the diet clean and stay active.  Let us know if you need anything.

## 2021-05-24 ENCOUNTER — Telehealth: Payer: Self-pay | Admitting: Family Medicine

## 2021-05-24 NOTE — Telephone Encounter (Signed)
Called HH and gave a verbal ok per PCP for request

## 2021-05-24 NOTE — Telephone Encounter (Signed)
Caller/Agency: [facility] Number: [phone] Requesting OT/PT/Skilled Nursing/Social Work/Speech Therapy: OT Frequency: 1 w 1 , 2 w 4, 1 w 3   Ok to leave VM

## 2021-05-27 ENCOUNTER — Telehealth: Payer: Self-pay | Admitting: Family Medicine

## 2021-05-27 ENCOUNTER — Other Ambulatory Visit: Payer: Self-pay | Admitting: Family Medicine

## 2021-05-27 DIAGNOSIS — M79641 Pain in right hand: Secondary | ICD-10-CM

## 2021-05-27 DIAGNOSIS — R5381 Other malaise: Secondary | ICD-10-CM

## 2021-05-27 DIAGNOSIS — J449 Chronic obstructive pulmonary disease, unspecified: Secondary | ICD-10-CM

## 2021-05-27 NOTE — Telephone Encounter (Signed)
Referral done

## 2021-05-27 NOTE — Addendum Note (Signed)
Addended by: Sharon Seller B on: 05/27/2021 04:30 PM   Modules accepted: Orders

## 2021-05-27 NOTE — Addendum Note (Signed)
Addended by: Sharon Seller B on: 05/27/2021 04:32 PM   Modules accepted: Orders

## 2021-05-27 NOTE — Telephone Encounter (Signed)
Patient's daughter would like for Dr. [name] to send an order for her father to get an aid to help him bathe and other things. Pt's daughter states her back is hurting her and cannot continue. She would like a call back to see how it work and what she needs to do. Please advice.

## 2021-06-02 ENCOUNTER — Ambulatory Visit: Payer: Medicare HMO | Admitting: Orthopedic Surgery

## 2021-06-06 ENCOUNTER — Telehealth: Payer: Self-pay | Admitting: Family Medicine

## 2021-06-06 NOTE — Telephone Encounter (Signed)
Pt's daughter states current HH does not have nurses to help with bathing. She would like a referral placed as soon as possible. Please advise.

## 2021-06-06 NOTE — Telephone Encounter (Signed)
Daughter returned call and she was told by [facility] home they do not have staff for bathing. We had added OT on Friday to this order/were told by doing so could do the bathing. Will let [facility] know and see if just new referral needs to be done.

## 2021-06-06 NOTE — Telephone Encounter (Signed)
Called left message to call back 

## 2021-06-07 ENCOUNTER — Other Ambulatory Visit: Payer: Self-pay | Admitting: Family Medicine

## 2021-06-07 ENCOUNTER — Telehealth: Payer: Self-pay | Admitting: Family Medicine

## 2021-06-07 DIAGNOSIS — J449 Chronic obstructive pulmonary disease, unspecified: Secondary | ICD-10-CM

## 2021-06-07 DIAGNOSIS — R5381 Other malaise: Secondary | ICD-10-CM

## 2021-06-07 NOTE — Telephone Encounter (Signed)
HH informed of PCP's verbal ok.

## 2021-06-07 NOTE — Telephone Encounter (Signed)
Anderson Malta is requesting verbal orders for physical therapy, occupational therapy, [facility] aid, and nursing for gout. She stated a voicemail can be left if she does not answer. [phone]

## 2021-06-07 NOTE — Telephone Encounter (Signed)
New referral entered today per [facility] request.

## 2021-06-30 ENCOUNTER — Telehealth: Payer: Self-pay | Admitting: Family Medicine

## 2021-06-30 NOTE — Telephone Encounter (Signed)
HH informed of verbal PCP order.

## 2021-06-30 NOTE — Telephone Encounter (Signed)
Tish from [facility] is requesting VO for extended physical therapy effective today. [phone] 1x this week 2x for 2 weeks.

## 2021-07-01 ENCOUNTER — Encounter: Payer: Self-pay | Admitting: Family Medicine

## 2021-07-01 ENCOUNTER — Telehealth (INDEPENDENT_AMBULATORY_CARE_PROVIDER_SITE_OTHER): Payer: Medicare HMO | Admitting: Family Medicine

## 2021-07-01 DIAGNOSIS — M79641 Pain in right hand: Secondary | ICD-10-CM

## 2021-07-01 DIAGNOSIS — M79642 Pain in left hand: Secondary | ICD-10-CM

## 2021-07-01 DIAGNOSIS — E039 Hypothyroidism, unspecified: Secondary | ICD-10-CM

## 2021-07-01 NOTE — Progress Notes (Signed)
Chief Complaint  Patient presents with   Follow-up    Thyroid     Subjective: Patient is a 87 y.o. male here for f/u hypothyroid. Due to COVID-19 pandemic, we are interacting via web portal for an electronic face-to-face visit. I verified patient's ID using 2 identifiers. Patient agreed to proceed with visit via this method. Patient is at home, I am at office. Patient, his daughter, and I are present for visit.   Hypothyroidism Patient presents for follow-up of hypothyroidism.  Reports compliance with medication- Synthroid 75 mcg/d. Current symptoms include: denies fatigue, weight changes, heat/cold intolerance, bowel/skin changes or CVS symptoms He believes his dose should be not significantly changes  Bilateral hand pain Chronic history of bilateral hand pain.  He has decreased range of motion in his fingers.  He was offered occupational therapy in the past but politely declined.  He is interested in doing so today.  No recent injury or change in activity.  Tylenol somewhat helpful.  Past Medical History:  Diagnosis Date   Acute on chronic renal insufficiency 12/11/2017   Allergy    Aortic atherosclerosis (HCC)    Arthropathy    CAD (coronary artery disease)    Cardiomyopathy ([place])    Chronic kidney disease    Chronic renal failure 67/04/4579   Chronic systolic congestive heart failure ([place]) 01/23/2019   Colon polyp    COPD (chronic obstructive pulmonary disease) (HCC)    Diabetes (HCC)    Diabetic neuropathy (HCC)    Dilated cardiomyopathy ([place]) 11/20/2017   Heart murmur    Hypercholesterolemia    Hypertension    pulmonary   LVH (left ventricular hypertrophy)    Lymphoma (HCC)    Metabolic bone disease 99/83/3825   Mitral regurgitation    Prostate cancer (HCC)    Pulmonary hypertension (HCC)    Tricuspid regurgitation    Type 2 diabetes mellitus with neurological complications ([place]) 05/39/7673   Vitamin D deficiency 12/12/2019    Objective: No conversational  dyspnea Age appropriate judgment and insight Nml affect and mood  Assessment and Plan: Hypothyroidism, unspecified type - Plan: TSH, T4, free  Bilateral hand pain  Chronic, stable. Cont Synthroid 75 mcg/d. Ck labs next week.  Chronic, not controlled.  Tylenol, ice, heat, refer to occupational therapy, home health given poor mobility and mentation.  He does not drive. The patient and his daughter voiced understanding and agreement to the plan.  [place], DO 07/01/21  10:11 AM

## 2021-07-13 ENCOUNTER — Other Ambulatory Visit: Payer: Medicare HMO

## 2021-07-15 ENCOUNTER — Other Ambulatory Visit: Payer: Medicare HMO

## 2021-07-15 ENCOUNTER — Telehealth: Payer: Self-pay | Admitting: Family Medicine

## 2021-07-15 NOTE — Telephone Encounter (Signed)
Caller/Agency: [facility]  ?Callback Number: [phone] ?Requesting extend OT ?Frequency: 1x6 starting Monday 07/18/21 ?

## 2021-07-15 NOTE — Telephone Encounter (Signed)
Called back and gave verbal PCP ok ?

## 2021-07-19 ENCOUNTER — Telehealth: Payer: Self-pay | Admitting: Family Medicine

## 2021-07-19 ENCOUNTER — Other Ambulatory Visit (INDEPENDENT_AMBULATORY_CARE_PROVIDER_SITE_OTHER): Payer: Medicare HMO

## 2021-07-19 DIAGNOSIS — E039 Hypothyroidism, unspecified: Secondary | ICD-10-CM | POA: Diagnosis not present

## 2021-07-19 LAB — TSH: TSH: 88.65 u[IU]/mL — ABNORMAL HIGH (ref 0.35–5.50)

## 2021-07-19 LAB — T4, FREE: Free T4: 0.28 ng/dL — ABNORMAL LOW (ref 0.60–1.60)

## 2021-07-19 NOTE — Telephone Encounter (Signed)
Pt's daughter came into office with pt today while getting labs  ? ?Pt's daughter is requesting new meds for Promethazine for pt's cough and congestion  ? ? ?Please advise  ?

## 2021-07-19 NOTE — Telephone Encounter (Signed)
He will need an appointment for this. ?

## 2021-07-19 NOTE — Telephone Encounter (Signed)
Pt sched vv tomorrow morning  ?

## 2021-07-20 ENCOUNTER — Telehealth (INDEPENDENT_AMBULATORY_CARE_PROVIDER_SITE_OTHER): Payer: Medicare HMO | Admitting: Family Medicine

## 2021-07-20 ENCOUNTER — Encounter: Payer: Self-pay | Admitting: Family Medicine

## 2021-07-20 DIAGNOSIS — I509 Heart failure, unspecified: Secondary | ICD-10-CM | POA: Diagnosis not present

## 2021-07-20 DIAGNOSIS — E039 Hypothyroidism, unspecified: Secondary | ICD-10-CM

## 2021-07-20 DIAGNOSIS — R051 Acute cough: Secondary | ICD-10-CM | POA: Diagnosis not present

## 2021-07-20 MED ORDER — LEVOTHYROXINE SODIUM 88 MCG PO TABS: 88.0000 ug | ORAL_TABLET | Freq: Every day | ORAL | 3 refills | Status: DC

## 2021-07-20 MED ORDER — PROMETHAZINE-DM 6.25-15 MG/5ML PO SYRP: 5.0000 mL | ORAL_SOLUTION | Freq: Four times a day (QID) | ORAL | 0 refills | Status: DC | PRN

## 2021-07-20 NOTE — Progress Notes (Signed)
Chief Complaint  ?Patient presents with  ? Cough  ?  Congestion ?  ? ? ?[name] here for URI complaints. Due to COVID-19 pandemic, we are interacting via web portal for an electronic face-to-face visit. I verified patient's ID using 2 identifiers. Patient agreed to proceed with visit via this method. Patient is at home, I am at office. Patient, daughter and I are present for visit.  ? ?Duration: 2 weeks  ?Associated symptoms: shortness of breath and coughing, swelling in legs, possible weight gain ?Denies: sinus congestion, sinus pain, rhinorrhea, itchy watery eyes, ear pain, ear drainage, sore throat, wheezing, myalgia, and fevers ?Treatment to date: Nyquil ?Has only been taking Lasix daily.  ?Sick contacts: No ? ?Past Medical History:  ?Diagnosis Date  ? Acute on chronic renal insufficiency 12/11/2017  ? Allergy   ? Aortic atherosclerosis ([place])   ? Arthropathy   ? CAD (coronary artery disease)   ? Cardiomyopathy ([place])   ? Chronic kidney disease   ? Chronic renal failure 11/12/2019  ? Chronic systolic congestive heart failure ([place]) 01/23/2019  ? Colon polyp   ? COPD (chronic obstructive pulmonary disease) ([place])   ? Diabetes ([place])   ? Diabetic neuropathy ([place])   ? Dilated cardiomyopathy ([place]) 11/20/2017  ? Heart murmur   ? Hypercholesterolemia   ? Hypertension   ? pulmonary  ? LVH (left ventricular hypertrophy)   ? Lymphoma ([place])   ? Metabolic bone disease 16/02/9603  ? Mitral regurgitation   ? Prostate cancer ([place])   ? Pulmonary hypertension ([place])   ? Tricuspid regurgitation   ? Type 2 diabetes mellitus with neurological complications ([place]) 54/01/8118  ? Vitamin D deficiency 12/12/2019  ? ? ?Objective ?No conversational dyspnea ?Age appropriate judgment and insight ?Nml affect and mood ? ?Acute on chronic congestive heart failure, unspecified heart failure type ([place]) ? ?Acute cough - Plan: promethazine-dextromethorphan (PROMETHAZINE-DM) 6.25-15 MG/5ML syrup ? ?Hypothyroidism, unspecified type - Plan:  levothyroxine (SYNTHROID) 88 MCG tablet, TSH, T4, free ? ?Needs to take 2nd dose of Lasix around 6 hrs after first one. Mind salt intake, elevate legs. F/u early next week if not improving. ?Did well w cough syrup in past, will cover for URI s/s's, though I did express to pt and daughter that I think this is more related to the excess fluid.  ?Increase Synthroid from 75 mcg/d to 88 mcg/d, reck labs in 6 weeks ?Pt and his daughter voiced understanding and agreement to the plan. ? ?Shelda Pal, DO ?07/20/21 ?11:32 AM ? ?

## 2021-08-26 ENCOUNTER — Telehealth: Payer: Self-pay | Admitting: Family Medicine

## 2021-08-26 NOTE — Telephone Encounter (Signed)
[facility] called to inform [facility] that pt will be discharged from OT but will continue PT. ? ?[facility]- [phone]  ?

## 2021-08-26 NOTE — Telephone Encounter (Signed)
FYI

## 2021-09-06 ENCOUNTER — Telehealth: Payer: Self-pay | Admitting: Family Medicine

## 2021-09-06 NOTE — Telephone Encounter (Signed)
Caller/Agency: [facility]  ?Callback Number: [phone] ?Requesting OT/PT/Skilled Nursing/Social Work/Speech Therapy: PT ?Frequency: 1 x 2, starting today ?

## 2021-09-06 NOTE — Telephone Encounter (Signed)
Called and informed of PCP verbal ok. ?

## 2021-09-14 ENCOUNTER — Telehealth: Payer: Self-pay | Admitting: Family Medicine

## 2021-09-14 NOTE — Telephone Encounter (Signed)
Unable to lvm for patient to call back and schedule yearly physical at his earliest convenience.  ?

## 2021-09-19 ENCOUNTER — Other Ambulatory Visit: Payer: Self-pay | Admitting: Family Medicine

## 2021-09-20 ENCOUNTER — Telehealth: Payer: Self-pay | Admitting: Family Medicine

## 2021-09-20 NOTE — Telephone Encounter (Signed)
Daughter [name] called to inform patients feet/legs are dark and swollen. ?He is taking all his medications ?The patient has an appointment this Friday 09/23/21. ?The daughter is having knee replacement in June and needs for the patient to go to Rehab/assist. Living for about 90 days. ?She would like PCP to encourage the patient at his appt. This would be the best thing for him to do. ?She is his only caregiver and that he can do nothing without her help. ?She states if he stays alone he would not survive.  ?

## 2021-09-23 ENCOUNTER — Encounter: Payer: Self-pay | Admitting: Family Medicine

## 2021-09-23 ENCOUNTER — Ambulatory Visit (INDEPENDENT_AMBULATORY_CARE_PROVIDER_SITE_OTHER): Payer: Medicare HMO | Admitting: Family Medicine

## 2021-09-23 VITALS — BP 121/72 | Temp 97.3°F | Ht 69.5 in | Wt 163.5 lb

## 2021-09-23 DIAGNOSIS — R600 Localized edema: Secondary | ICD-10-CM | POA: Diagnosis not present

## 2021-09-23 DIAGNOSIS — E1149 Type 2 diabetes mellitus with other diabetic neurological complication: Secondary | ICD-10-CM

## 2021-09-23 MED ORDER — FUROSEMIDE 80 MG PO TABS: ORAL_TABLET | ORAL | 1 refills | Status: DC

## 2021-09-23 NOTE — Patient Instructions (Addendum)
It may be worthwhile to look into assisted living facilities online and bring me a FL-2 form.  For the swelling in your lower extremities, be sure to elevate your legs when able, mind the salt intake, stay physically active and consider wearing compression stockings.  Let us know if you need anything.

## 2021-09-23 NOTE — Progress Notes (Signed)
Chief Complaint  Patient presents with   Edema    Subjective: Patient is a 87 y.o. male here for swelling. Here w daughter who helps w hx.   Patient has had worsening swelling in his lower extremities over the past few weeks.  He has been compliant with his Lasix twice daily routine.  He takes 120 mg in the morning which causes him to need to urinate relatively quickly per him.  It last for around 3 hours before wearing off.  He takes the next dosage in the evening.  He states that it causes him to urinate all night though.  He has no adverse effects and reports compliance.  His nighttime dosage is 80 mg.  He is wearing compression stockings.  He tries to stay physically active.  Due to insurance reasons, he is not currently working with his physical therapy team.  He tries to cycle and walk, but per daughter he does not do this too often.  He has a history of chronic kidney disease and chronic heart failure.  He denies any anuria or chest pain/shortness of breath/coughing.  Past Medical History:  Diagnosis Date   Acute on chronic renal insufficiency 12/11/2017   Allergy    Aortic atherosclerosis (HCC)    Arthropathy    CAD (coronary artery disease)    Cardiomyopathy ([place])    Chronic kidney disease    Chronic renal failure 08/67/6195   Chronic systolic congestive heart failure ([place]) 01/23/2019   Colon polyp    COPD (chronic obstructive pulmonary disease) (HCC)    Diabetes (HCC)    Diabetic neuropathy (HCC)    Dilated cardiomyopathy ([place]) 11/20/2017   Heart murmur    Hypercholesterolemia    Hypertension    pulmonary   LVH (left ventricular hypertrophy)    Lymphoma (HCC)    Metabolic bone disease 09/32/6712   Mitral regurgitation    Prostate cancer (HCC)    Pulmonary hypertension (HCC)    Tricuspid regurgitation    Type 2 diabetes mellitus with neurological complications (HCC) 45/80/9983   Vitamin D deficiency 12/12/2019    Objective: BP 121/72   Temp (!) 97.3 F (36.3 C)  (Oral)   Ht 5' 9.5" (1.765 m)   Wt 163 lb 8 oz (74.2 kg)   BMI 23.80 kg/m  General: Awake, appears stated age Heart: RRR, 3+ pitting bilateral LE edema tapering at the knees Lungs: CTAB, no rales, wheezes or rhonchi. No accessory muscle use Psych: normal affect and mood  Assessment and Plan: Localized edema - Plan: furosemide (LASIX) 80 MG tablet, Basic metabolic panel  Type 2 diabetes mellitus with neurological complications ([place]) - Plan: Hemoglobin A1c  Chronic, unstable.  Increase frequency of Lasix dosage.  He will take his morning dose around 9 AM as usual of 120 mg.  His second 120 mg dosage will be around 1.  His final dose of 80 mg will be 5-6 PM.  He will return in 1 week for a BMP.  I think his nighttime frequency is more related to prostate issues that it is diuretic use.  Continue to elevate legs, stay physically active, and use compression stockings.  Mind salt intake. Bring me a FL-2 form. Daughter will look for ALF places online.  She will need time to stay in an assisted living facility for 90 days while she recovers from a surgery. The patient and daughter voiced understanding and agreement to the plan.  [place], DO 09/23/21  11:53 AM

## 2021-09-30 ENCOUNTER — Other Ambulatory Visit: Payer: Medicare HMO

## 2021-10-04 ENCOUNTER — Other Ambulatory Visit: Payer: Medicare HMO

## 2021-10-07 DIAGNOSIS — C8331 Diffuse large B-cell lymphoma, lymph nodes of head, face, and neck: Secondary | ICD-10-CM | POA: Diagnosis not present

## 2021-10-07 DIAGNOSIS — R0602 Shortness of breath: Secondary | ICD-10-CM | POA: Diagnosis not present

## 2021-10-07 DIAGNOSIS — I480 Paroxysmal atrial fibrillation: Secondary | ICD-10-CM | POA: Diagnosis not present

## 2021-10-07 DIAGNOSIS — R778 Other specified abnormalities of plasma proteins: Secondary | ICD-10-CM | POA: Diagnosis not present

## 2021-10-07 DIAGNOSIS — I129 Hypertensive chronic kidney disease with stage 1 through stage 4 chronic kidney disease, or unspecified chronic kidney disease: Secondary | ICD-10-CM | POA: Diagnosis not present

## 2021-10-07 DIAGNOSIS — I499 Cardiac arrhythmia, unspecified: Secondary | ICD-10-CM | POA: Diagnosis not present

## 2021-10-07 DIAGNOSIS — I4510 Unspecified right bundle-branch block: Secondary | ICD-10-CM | POA: Diagnosis not present

## 2021-10-07 DIAGNOSIS — I959 Hypotension, unspecified: Secondary | ICD-10-CM | POA: Diagnosis not present

## 2021-10-07 DIAGNOSIS — I5022 Chronic systolic (congestive) heart failure: Secondary | ICD-10-CM | POA: Diagnosis not present

## 2021-10-07 DIAGNOSIS — I081 Rheumatic disorders of both mitral and tricuspid valves: Secondary | ICD-10-CM | POA: Diagnosis not present

## 2021-10-07 DIAGNOSIS — J90 Pleural effusion, not elsewhere classified: Secondary | ICD-10-CM | POA: Diagnosis not present

## 2021-10-07 DIAGNOSIS — N184 Chronic kidney disease, stage 4 (severe): Secondary | ICD-10-CM | POA: Diagnosis not present

## 2021-10-07 DIAGNOSIS — J189 Pneumonia, unspecified organism: Secondary | ICD-10-CM | POA: Diagnosis not present

## 2021-10-07 DIAGNOSIS — J439 Emphysema, unspecified: Secondary | ICD-10-CM | POA: Diagnosis not present

## 2021-10-07 DIAGNOSIS — I130 Hypertensive heart and chronic kidney disease with heart failure and stage 1 through stage 4 chronic kidney disease, or unspecified chronic kidney disease: Secondary | ICD-10-CM | POA: Diagnosis not present

## 2021-10-07 DIAGNOSIS — R918 Other nonspecific abnormal finding of lung field: Secondary | ICD-10-CM | POA: Diagnosis not present

## 2021-10-07 DIAGNOSIS — E039 Hypothyroidism, unspecified: Secondary | ICD-10-CM | POA: Diagnosis not present

## 2021-10-07 DIAGNOSIS — I10 Essential (primary) hypertension: Secondary | ICD-10-CM | POA: Diagnosis not present

## 2021-10-07 DIAGNOSIS — R042 Hemoptysis: Secondary | ICD-10-CM | POA: Diagnosis not present

## 2021-10-08 DIAGNOSIS — I10 Essential (primary) hypertension: Secondary | ICD-10-CM | POA: Diagnosis not present

## 2021-10-08 DIAGNOSIS — R778 Other specified abnormalities of plasma proteins: Secondary | ICD-10-CM | POA: Diagnosis not present

## 2021-10-08 DIAGNOSIS — N179 Acute kidney failure, unspecified: Secondary | ICD-10-CM | POA: Diagnosis not present

## 2021-10-08 DIAGNOSIS — Z87891 Personal history of nicotine dependence: Secondary | ICD-10-CM | POA: Diagnosis not present

## 2021-10-08 DIAGNOSIS — J90 Pleural effusion, not elsewhere classified: Secondary | ICD-10-CM | POA: Diagnosis not present

## 2021-10-08 DIAGNOSIS — J439 Emphysema, unspecified: Secondary | ICD-10-CM | POA: Diagnosis not present

## 2021-10-08 DIAGNOSIS — E039 Hypothyroidism, unspecified: Secondary | ICD-10-CM | POA: Diagnosis not present

## 2021-10-08 DIAGNOSIS — R918 Other nonspecific abnormal finding of lung field: Secondary | ICD-10-CM | POA: Diagnosis not present

## 2021-10-08 DIAGNOSIS — R042 Hemoptysis: Secondary | ICD-10-CM | POA: Diagnosis not present

## 2021-10-08 DIAGNOSIS — E1149 Type 2 diabetes mellitus with other diabetic neurological complication: Secondary | ICD-10-CM | POA: Diagnosis not present

## 2021-10-08 DIAGNOSIS — E7800 Pure hypercholesterolemia, unspecified: Secondary | ICD-10-CM | POA: Diagnosis not present

## 2021-10-09 DIAGNOSIS — R042 Hemoptysis: Secondary | ICD-10-CM | POA: Diagnosis not present

## 2021-10-09 DIAGNOSIS — I959 Hypotension, unspecified: Secondary | ICD-10-CM | POA: Diagnosis not present

## 2021-10-09 DIAGNOSIS — I480 Paroxysmal atrial fibrillation: Secondary | ICD-10-CM | POA: Diagnosis not present

## 2021-10-09 DIAGNOSIS — I420 Dilated cardiomyopathy: Secondary | ICD-10-CM | POA: Diagnosis not present

## 2021-10-09 DIAGNOSIS — R2231 Localized swelling, mass and lump, right upper limb: Secondary | ICD-10-CM | POA: Diagnosis not present

## 2021-10-09 DIAGNOSIS — N189 Chronic kidney disease, unspecified: Secondary | ICD-10-CM | POA: Diagnosis not present

## 2021-10-09 DIAGNOSIS — M7121 Synovial cyst of popliteal space [Baker], right knee: Secondary | ICD-10-CM | POA: Diagnosis not present

## 2021-10-09 DIAGNOSIS — I440 Atrioventricular block, first degree: Secondary | ICD-10-CM | POA: Diagnosis not present

## 2021-10-09 DIAGNOSIS — C8330 Diffuse large B-cell lymphoma, unspecified site: Secondary | ICD-10-CM | POA: Diagnosis not present

## 2021-10-09 DIAGNOSIS — I5022 Chronic systolic (congestive) heart failure: Secondary | ICD-10-CM | POA: Diagnosis not present

## 2021-10-09 DIAGNOSIS — I872 Venous insufficiency (chronic) (peripheral): Secondary | ICD-10-CM | POA: Diagnosis not present

## 2021-10-09 DIAGNOSIS — R778 Other specified abnormalities of plasma proteins: Secondary | ICD-10-CM | POA: Diagnosis not present

## 2021-10-09 DIAGNOSIS — I4510 Unspecified right bundle-branch block: Secondary | ICD-10-CM | POA: Diagnosis not present

## 2021-10-09 DIAGNOSIS — E039 Hypothyroidism, unspecified: Secondary | ICD-10-CM | POA: Diagnosis not present

## 2021-10-09 DIAGNOSIS — R2243 Localized swelling, mass and lump, lower limb, bilateral: Secondary | ICD-10-CM | POA: Diagnosis not present

## 2021-10-09 DIAGNOSIS — E1169 Type 2 diabetes mellitus with other specified complication: Secondary | ICD-10-CM | POA: Diagnosis not present

## 2021-10-10 ENCOUNTER — Telehealth: Payer: Self-pay

## 2021-10-10 DIAGNOSIS — I959 Hypotension, unspecified: Secondary | ICD-10-CM | POA: Diagnosis not present

## 2021-10-10 DIAGNOSIS — I420 Dilated cardiomyopathy: Secondary | ICD-10-CM | POA: Diagnosis not present

## 2021-10-10 DIAGNOSIS — R042 Hemoptysis: Secondary | ICD-10-CM | POA: Diagnosis not present

## 2021-10-10 DIAGNOSIS — E039 Hypothyroidism, unspecified: Secondary | ICD-10-CM | POA: Diagnosis not present

## 2021-10-10 DIAGNOSIS — E1149 Type 2 diabetes mellitus with other diabetic neurological complication: Secondary | ICD-10-CM | POA: Diagnosis not present

## 2021-10-10 DIAGNOSIS — C8330 Diffuse large B-cell lymphoma, unspecified site: Secondary | ICD-10-CM | POA: Diagnosis not present

## 2021-10-10 DIAGNOSIS — R2243 Localized swelling, mass and lump, lower limb, bilateral: Secondary | ICD-10-CM | POA: Diagnosis not present

## 2021-10-10 DIAGNOSIS — N189 Chronic kidney disease, unspecified: Secondary | ICD-10-CM | POA: Diagnosis not present

## 2021-10-10 DIAGNOSIS — R778 Other specified abnormalities of plasma proteins: Secondary | ICD-10-CM | POA: Diagnosis not present

## 2021-10-10 DIAGNOSIS — I5022 Chronic systolic (congestive) heart failure: Secondary | ICD-10-CM | POA: Diagnosis not present

## 2021-10-10 DIAGNOSIS — I480 Paroxysmal atrial fibrillation: Secondary | ICD-10-CM | POA: Diagnosis not present

## 2021-10-10 NOTE — Telephone Encounter (Signed)
Patient's daughter is calling to let Dr. [name] know that her dad got discharged from the hospital today and he is not going to rehab. They were offered to make a hospital follow up appointment but she refused and stated that her dad was not going to want to come and they will just keep the 06/21 appointment.

## 2021-10-10 NOTE — Telephone Encounter (Signed)
Called the daughter and the patient is in the hospital/admitted.

## 2021-10-10 NOTE — Telephone Encounter (Signed)
Nurse Assessment Nurse: Ardine Bjork, RN, Melissa Date/Time (Eastern Time): 10/07/2021 7:39:12 PM Confirm and document reason for call. If symptomatic, describe symptoms. ---Caller states this morning, her father had blood in his sputum She would like to know should she take her father to urgent care-pt had spit in his cup-pt states he has had a cough-denies vomiting-cough started last week. Denies fever, sob, vomiting or diarrhea. Has had trouble swallowing-states sxs resolved earlier this week. Does the patient have any new or worsening symptoms? ---Yes Will a triage be completed? ---Yes Related visit to physician within the last 2 weeks? ---No Does the PT have any chronic conditions? (i.e. diabetes, asthma, this includes High risk factors for pregnancy, etc.) ---Yes List chronic conditions. ---HTN, COPD. Is this a behavioral health or substance abuse call? ---No Guidelines Guideline Title Affirmed Question Affirmed Notes Nurse Date/Time (Eastern Time) Coughing Up Blood Patient sounds very sick or weak to the triager Ardine Bjork, RN, Lenna Sciara 10/07/2021 7:48:39 PM PLEASE NOTE: All timestamps contained within this report are represented as [place] Standard Time. CONFIDENTIALTY NOTICE: This fax transmission is intended only for the addressee. It contains information that is legally privileged, confidential or otherwise protected from use or disclosure. If you are not the intended recipient, you are strictly prohibited from reviewing, disclosing, copying using or disseminating any of this information or taking any action in reliance on or regarding this information. If you have received this fax in error, please notify us immediately by telephone so that we can arrange for its return to [place]. Phone: [phone], Toll-Free: [phone], Fax: [phone] Page: 2 of 2 Call Id: [id number] [place]. Time Eilene Ghazi Time) Disposition Final User 10/07/2021 7:37:02 PM Send to Urgent Queue Valere Dross 10/07/2021  7:51:14 PM Call Completed Ardine Bjork, RN, Melissa 10/07/2021 7:54:20 PM Go to ED Now Yes Ardine Bjork, RN, Melissa Disposition Overriden: Go to ED Now (or PCP triage) Override Reason: Patient's symptoms need a higher level of care Caller Disagree/Comply Comply Caller Understands Yes PreDisposition InappropriateToAsk Care Advice Given Per Guideline GO TO ED NOW: * You need to be seen in the Emergency Department. * Go to the ED at ___________ [place] now. Drive carefully. * It is better and safer if another adult drives instead of you. ANOTHER ADULT SHOULD DRIVE: CARE ADVICE given per Coughing Up Blood guideline. CALL EMS 911 IF: * Severe difficulty breathing * Lips or face turns blue * Passes out or becomes confused. SAMPLE: * Bring in a sample of the blood for testing (R/O false positive). Comments User: Dub Mikes, RN Date/Time Eilene Ghazi Time): 10/07/2021 7:42:16 PM Pt contact: [phone]. User: Dub Mikes, RN Date/Time Eilene Ghazi Time): 10/07/2021 7:49:07 PM Dtr states pt had blood clots in sputum x 1 this am-nickel-sized. Referrals [facility] - ED

## 2021-10-12 ENCOUNTER — Other Ambulatory Visit: Payer: Medicare HMO

## 2021-10-12 DIAGNOSIS — R918 Other nonspecific abnormal finding of lung field: Secondary | ICD-10-CM | POA: Diagnosis not present

## 2021-10-12 DIAGNOSIS — R042 Hemoptysis: Secondary | ICD-10-CM | POA: Diagnosis not present

## 2021-10-12 DIAGNOSIS — J9811 Atelectasis: Secondary | ICD-10-CM | POA: Diagnosis not present

## 2021-10-12 DIAGNOSIS — J90 Pleural effusion, not elsewhere classified: Secondary | ICD-10-CM | POA: Diagnosis not present

## 2021-10-12 DIAGNOSIS — I700 Atherosclerosis of aorta: Secondary | ICD-10-CM | POA: Diagnosis not present

## 2021-10-12 DIAGNOSIS — J439 Emphysema, unspecified: Secondary | ICD-10-CM | POA: Diagnosis not present

## 2021-10-13 ENCOUNTER — Telehealth: Payer: Self-pay | Admitting: Family Medicine

## 2021-10-13 ENCOUNTER — Telehealth: Payer: Self-pay

## 2021-10-13 DIAGNOSIS — R918 Other nonspecific abnormal finding of lung field: Secondary | ICD-10-CM | POA: Diagnosis not present

## 2021-10-13 DIAGNOSIS — J439 Emphysema, unspecified: Secondary | ICD-10-CM | POA: Diagnosis not present

## 2021-10-13 MED ORDER — ALBUTEROL SULFATE HFA 108 (90 BASE) MCG/ACT IN AERS: 2.0000 | INHALATION_SPRAY | Freq: Four times a day (QID) | RESPIRATORY_TRACT | 2 refills | Status: DC | PRN

## 2021-10-13 NOTE — Telephone Encounter (Signed)
Caller/Agency: [facility] Number: [phone] Requesting OT/PT/Skilled Nursing/Social Work/Speech Therapy: PT Frequency: starting next week 2 w 4 , 1 w 4

## 2021-10-13 NOTE — Telephone Encounter (Signed)
OK 

## 2021-10-13 NOTE — Telephone Encounter (Signed)
Medication: albuterol (PROVENTIL HFA;VENTOLIN HFA) 108 (90 Base) MCG/ACT inhaler Fluticasone-Umeclidin-Vilant (TRELEGY ELLIPTA) 100-62.5-25 MCG/INH AEPB   Has the patient contacted their pharmacy? No.  Preferred Pharmacy (with phone number or street name): [facility]  [place], [place] [address]  Phone:  [phone]  Fax:  [phone]

## 2021-10-13 NOTE — Telephone Encounter (Signed)
VO given.

## 2021-10-13 NOTE — Telephone Encounter (Signed)
Nurse Assessment Nurse: Wiser, RN, Heidi Date/Time (Eastern Time): 10/12/2021 6:17:34 PM Confirm and document reason for call. If symptomatic, describe symptoms. ---Caller states father has been coughing up blood for a week. Seen at the ED and admitted on 10/07/21. Dx with pneumonia. Discharged Monday. Does the patient have any new or worsening symptoms? ---Yes Will a triage be completed? ---Yes Related visit to physician within the last 2 weeks? ---Yes Does the PT have any chronic conditions? (i.e. diabetes, asthma, this includes High risk factors for pregnancy, etc.) ---Yes List chronic conditions. ---CHF see record Is this a behavioral health or substance abuse call? ---No Guidelines Guideline Title Affirmed Question Affirmed Notes Nurse Date/Time (Eastern Time) Pneumonia on Antibiotic PostHospitalization Follow-up Call [1] Coughed up blood AND [2] > 1 tablespoon (15 ml) (Exception: Bloodtinged sputum.) Wiser, RN, Heidi 10/12/2021 6:20:30 PM Disp. Time Eilene Ghazi Time) Disposition Final User 10/12/2021 6:16:12 PM Send to Urgent Queue Delphina Cahill, Richard PLEASE NOTE: All timestamps contained within this report are represented as [place] Standard Time. CONFIDENTIALTY NOTICE: This fax transmission is intended only for the addressee. It contains information that is legally privileged, confidential or otherwise protected from use or disclosure. If you are not the intended recipient, you are strictly prohibited from reviewing, disclosing, copying using or disseminating any of this information or taking any action in reliance on or regarding this information. If you have received this fax in error, please notify us immediately by telephone so that we can arrange for its return to [place]. Phone: [phone], Toll-Free: [phone], Fax: [phone] Page: 2 of 2 Call Id: [id number] 10/12/2021 6:27:45 PM Go to ED Now (or PCP triage) Yes Wiser, RN, Heidi Caller Disagree/Comply Comply Caller Understands  Yes PreDisposition Did not know what to do Care Advice Given Per Guideline GO TO ED NOW (OR PCP TRIAGE): * IF NO PCP (PRIMARY CARE PROVIDER) SECOND-LEVEL TRIAGE: You need to be seen within the next hour. Go to the [facility] at _____________ [place] as soon as you can. ANOTHER ADULT SHOULD DRIVE: * It is better and safer if another adult drives instead of you. CARE ADVICE given per Pneumonia on Antibiotic Post-Hospitalization Follow-Up Call (Adult) guideline. Referrals [facility] - ED

## 2021-10-13 NOTE — Telephone Encounter (Signed)
Pt in ED.  

## 2021-10-13 NOTE — Telephone Encounter (Signed)
Rx sent 

## 2021-10-14 ENCOUNTER — Telehealth: Payer: Self-pay | Admitting: *Deleted

## 2021-10-14 ENCOUNTER — Telehealth: Payer: Self-pay

## 2021-10-14 NOTE — Telephone Encounter (Signed)
Transition Care Management Follow-up Telephone Call Date of discharge and from where: 10/10/21 / [facility] How have you been since you were released from the hospital? "Doing ok with no more blood  Any questions or concerns? Yes Telephone call completed with patients daughter / designated party release Aleda Grana.  Discussed care coordination services/ follow up with daughter. Daughter verbally agreed to having RNCM care coordination follow up.   Items Reviewed: Did the pt receive and understand the discharge instructions provided? Yes  Medications obtained and verified? Yes  Other?  N/a Any new allergies since your discharge? No  Dietary orders reviewed? Yes Do you have support at home? Yes   Home Care and Equipment/Supplies: Were home health services ordered? yes If so, what is the name of the agency? [facility]  Has the agency set up a time to come to the patient's home? yes Were any new equipment or medical supplies ordered?  No What is the name of the medical supply agency? N/a Were you able to get the supplies/equipment? not applicable Do you have any questions related to the use of the equipment or supplies? No  Functional Questionnaire: (I = Independent and D = Dependent) ADLs: D  Bathing/Dressing- D  Meal Prep- D  Eating- I  Maintaining continence- I  Transferring/Ambulation- D - uses walker for ambulation and requires assistance.   Managing Meds- D  Follow up appointments reviewed:  PCP Hospital f/u appt confirmed? Yes  Scheduled to see Dr. Riki Sheer on 10/26/21 @ 10:15 am. Specialist Hospital f/u appt confirmed? No  Scheduled to see  Are transportation arrangements needed? No  If their condition worsens, is the pt aware to call PCP or go to the Emergency Dept.? Yes Was the patient provided with contact information for the PCP's office or ED? Yes Was to pt encouraged to call back with questions or concerns? Yes   Quinn Plowman RN,BSN,CCM RN Case Manager [phone]

## 2021-10-14 NOTE — Chronic Care Management (AMB) (Signed)
  Care Management   Note  10/14/2021 Name: Eric Herring MRN: [id number] DOB: 25-May-1934  Eric Herring is a 87 y.o. year old male who is a primary care patient of Shelda Pal, DO. I reached out to [name] by phone today offer care coordination services.   [name] was given information about care management services today including:  Care management services include personalized support from designated clinical staff supervised by his physician, including individualized plan of care and coordination with other care providers 24/7 contact phone numbers for assistance for urgent and routine care needs. The patient may stop care management services at any time by phone call to the office staff.  Patient agreed to services and verbal consent obtained.   Follow up plan: Telephone appointment with care management team member scheduled for: 10/27/2021  Julian Hy, [facility]  Direct Dial: [phone]

## 2021-10-14 NOTE — Progress Notes (Signed)
Dannielle Karvonen, RN  Cleland Simkins, Mer[place]a [place], CMA; Luretha Rued, RN Eric Herring,  This is a new referral to [place] from the [facility] in her practice.  Please schedule for her.  Thank you.

## 2021-10-16 ENCOUNTER — Other Ambulatory Visit: Payer: Self-pay | Admitting: Family Medicine

## 2021-10-21 ENCOUNTER — Telehealth: Payer: Self-pay | Admitting: Family Medicine

## 2021-10-21 NOTE — Telephone Encounter (Signed)
Caller/Agency: [facility]) Callback Number: [phone] ok to LVM Requesting OT/PT/Skilled Nursing/Social Work/Speech Therapy: OT Frequency: 1 w 6 starting today

## 2021-10-21 NOTE — Telephone Encounter (Signed)
Called HH informed of verbal ok per PCP. 

## 2021-10-21 NOTE — Telephone Encounter (Signed)
Caller: [facility]) Call back: [phone]  HH called to inform wendling of pt's readings.   Before daily activities pt's BP was 85/55 after 100/60 and after pt's walked 90/60. Pt's HR 88 and his oxygen has been 98% the whole time

## 2021-10-21 NOTE — Telephone Encounter (Signed)
Can we see how he is feeling? If he feels fine, might be OK to cont, but might have to decrease Lasix.

## 2021-10-24 NOTE — Telephone Encounter (Signed)
Called the daughter and she states he is still sleeping this am and her son is with him. She states that his BP is normally low. Patient has appt with PCP on 10/26/21 and can discuss at that appt if no changes. Home Health RN I spoke with will not be out today but tomorrow to see the patient again.

## 2021-10-26 ENCOUNTER — Encounter: Payer: Self-pay | Admitting: Family Medicine

## 2021-10-26 ENCOUNTER — Telehealth (INDEPENDENT_AMBULATORY_CARE_PROVIDER_SITE_OTHER): Payer: Medicare HMO | Admitting: Family Medicine

## 2021-10-26 DIAGNOSIS — R600 Localized edema: Secondary | ICD-10-CM | POA: Diagnosis not present

## 2021-10-26 MED ORDER — FUROSEMIDE 80 MG PO TABS: ORAL_TABLET | ORAL | 1 refills | Status: DC

## 2021-10-26 NOTE — Progress Notes (Signed)
Chief Complaint  Patient presents with   Follow-up    Subjective: Patient is a 87 y.o. male here for follow-up of lower extremity edema. Due to COVID-19 pandemic, we are interacting via telephone. I verified patient's ID using 2 identifiers. Patient and his daughter agreed to proceed with visit via this method. Patient is at home, I am at office. Patient, his daughter, and I are present for visit.   Patient recently had his Lasix increased and is currently taking 120 mg twice daily and 80 mg before bed.  He is urinating more frequently, particularly throughout the night.  He is unable to make it to the bathroom so has a urinal that he fills up throughout the night.  This is affecting his sleep and he would prefer to avoid this.  His swelling is improved.  He is wearing compression stockings routinely and elevating his legs when able.  He is not particularly physically active.  Diet is fair and not containing much sodium/salt.  Past Medical History:  Diagnosis Date   Acute on chronic renal insufficiency 12/11/2017   Allergy    Aortic atherosclerosis (HCC)    Arthropathy    CAD (coronary artery disease)    Cardiomyopathy ([place])    Chronic kidney disease    Chronic renal failure 73/53/2992   Chronic systolic congestive heart failure ([place]) 01/23/2019   Colon polyp    COPD (chronic obstructive pulmonary disease) (HCC)    Diabetes (HCC)    Diabetic neuropathy (HCC)    Dilated cardiomyopathy ([place]) 11/20/2017   Heart murmur    Hypercholesterolemia    Hypertension    pulmonary   LVH (left ventricular hypertrophy)    Lymphoma (HCC)    Metabolic bone disease 42/68/3419   Mitral regurgitation    Prostate cancer ([place])    Pulmonary hypertension (HCC)    Tricuspid regurgitation    Type 2 diabetes mellitus with neurological complications ([place]) 62/22/9798   Vitamin D deficiency 12/12/2019    Objective: There were no vitals taken for this visit. General: Awake, appears stated age Heart: RRR,  no LE edema Lungs: CTAB, no rales, wheezes or rhonchi. No accessory muscle use Psych: Age appropriate judgment and insight, normal affect and mood  Assessment and Plan: Localized edema - Plan: furosemide (LASIX) 80 MG tablet  Chronic, improving. Stop 80 mg evening dose of Lasix. Cont 120 mg bid. Cont w compression, elevation, minding salt and staying as active as possible. F/u in 6 mo. Total time: 11 min The patient and his daughter voiced understanding and agreement to the plan.  [place], DO 10/26/21  10:19 AM

## 2021-10-27 ENCOUNTER — Ambulatory Visit: Payer: Medicare HMO

## 2021-10-27 NOTE — Patient Instructions (Signed)
Visit Information  Thank you for allowing me to share the care management and care coordination services that are available to you as part of your health plan and services through your primary care provider and medical home. Please reach out to me at [phone] if the care management/care coordination team may be of assistance to you in the future.   Norm Wray, RN, MSN, BSN, CCM Care Management Coordinator [facility] [phone]  

## 2021-10-27 NOTE — Chronic Care Management (AMB) (Signed)
  Care Management   Outreach Note  10/27/2021 Name: Eric Herring MRN: [id number] DOB: 1934/08/06  Referred by: Shelda Pal, DO Reason for referral : Care Coordination   Successful contact was made with the patient's daughter, Eric Herring) to discuss care management and care coordination services. Declines engagement at this time.   Follow Up Plan:  No further follow up required: Ms. Eric Herring was encouraged to contact Primary Care Provider if patient has any care management or care coordination needs in the future.   Thea Silversmith, RN, MSN, BSN, CCM Care Management Coordinator [facility] [phone]

## 2021-10-28 DIAGNOSIS — E1140 Type 2 diabetes mellitus with diabetic neuropathy, unspecified: Secondary | ICD-10-CM | POA: Diagnosis not present

## 2021-10-28 DIAGNOSIS — I739 Peripheral vascular disease, unspecified: Secondary | ICD-10-CM | POA: Diagnosis not present

## 2021-10-28 DIAGNOSIS — B351 Tinea unguium: Secondary | ICD-10-CM | POA: Diagnosis not present

## 2021-10-28 DIAGNOSIS — Z87891 Personal history of nicotine dependence: Secondary | ICD-10-CM | POA: Diagnosis not present

## 2021-11-03 ENCOUNTER — Telehealth: Payer: Self-pay | Admitting: Family Medicine

## 2021-11-03 NOTE — Telephone Encounter (Signed)
Called his daughter [name] to inform and did schedule a telephone visit

## 2021-11-03 NOTE — Telephone Encounter (Signed)
Stop hydralazine, follow up w me next week. Ty.

## 2021-11-03 NOTE — Telephone Encounter (Signed)
[facility]) called with the follow BP readings for the Pt:  80/60 // Pulse:76 // BO2: 98 @ 12:40p on 6.29.23

## 2021-11-15 ENCOUNTER — Telehealth: Payer: Self-pay | Admitting: Family Medicine

## 2021-11-15 NOTE — Telephone Encounter (Signed)
Occupational therapist from [facility], just wanted to update Dr. [name] and let him know that patient wanted to cancel OT this week and resume next week.   [phone] Elias Else

## 2021-11-16 ENCOUNTER — Encounter: Payer: Self-pay | Admitting: Family Medicine

## 2021-11-16 ENCOUNTER — Telehealth (INDEPENDENT_AMBULATORY_CARE_PROVIDER_SITE_OTHER): Payer: Medicare HMO | Admitting: Family Medicine

## 2021-11-16 ENCOUNTER — Other Ambulatory Visit: Payer: Self-pay | Admitting: Family Medicine

## 2021-11-16 DIAGNOSIS — R600 Localized edema: Secondary | ICD-10-CM | POA: Diagnosis not present

## 2021-11-16 NOTE — Telephone Encounter (Signed)
[name] just wanted to make Dr. [name] aware that patient also declined PT for this week. Eric Herring spoke to pt's daughter and she stated that he does want to continue and to not dismiss him, he just needs this week off.

## 2021-11-16 NOTE — Progress Notes (Signed)
Chief Complaint  Patient presents with   Hypertension    Subjective Eric Herring is a 87 y.o. male who presents for hypertension follow up. Due to COVID-19 pandemic, we are interacting via telephone. I verified patient's ID using 2 identifiers. Patient's daughter agreed to proceed with visit via this method. Patient's daughter is at home, I am at office. Patient's daughter and I are present for visit.   Patient was seen a couple weeks ago and his evening dose of Lasix was decreased.  He is currently taking 120 mg twice daily and reports feeling much better.  He is not urinating in the middle of the night anymore and is resting much better.  It is unclear if he has been checking his blood pressure at home, the patient's daughter will send over her brother to check his blood pressure and see if he is having swelling.   Past Medical History:  Diagnosis Date   Acute on chronic renal insufficiency 12/11/2017   Allergy    Aortic atherosclerosis (HCC)    Arthropathy    CAD (coronary artery disease)    Cardiomyopathy ([place])    Chronic kidney disease    Chronic renal failure 09/47/0962   Chronic systolic congestive heart failure ([place]) 01/23/2019   Colon polyp    COPD (chronic obstructive pulmonary disease) (HCC)    Diabetes (HCC)    Diabetic neuropathy (HCC)    Dilated cardiomyopathy ([place]) 11/20/2017   Heart murmur    Hypercholesterolemia    Hypertension    pulmonary   LVH (left ventricular hypertrophy)    Lymphoma (HCC)    Metabolic bone disease 83/66/2947   Mitral regurgitation    Prostate cancer (HCC)    Pulmonary hypertension (HCC)    Tricuspid regurgitation    Type 2 diabetes mellitus with neurological complications ([place]) 65/46/5035   Vitamin D deficiency 12/12/2019    Exam Pt was not present during exam  Localized edema  For now, we will continue Lasix 120 mg twice daily. Family will let us know if we are still having swelling or having BP issues.  F/u in 6 mo or prn if  doing well. Total time: 7 min The patient's daughter voiced understanding and agreement to the plan.  [place], DO 11/16/21  3:03 PM

## 2021-11-21 ENCOUNTER — Telehealth: Payer: Self-pay | Admitting: Family Medicine

## 2021-11-21 NOTE — Telephone Encounter (Signed)
Caller/Agency: [facility] OT) Callback Number: [phone] Requesting OT/PT/Skilled Nursing/Social Work/Speech Therapy: OT Frequency: 1 w 3 starting 7.24.23

## 2021-11-21 NOTE — Telephone Encounter (Signed)
Called [facility] informed of verbal ok per PCP.

## 2021-11-23 ENCOUNTER — Telehealth: Payer: Self-pay | Admitting: Family Medicine

## 2021-11-23 ENCOUNTER — Other Ambulatory Visit (INDEPENDENT_AMBULATORY_CARE_PROVIDER_SITE_OTHER): Payer: Medicare HMO

## 2021-11-23 ENCOUNTER — Other Ambulatory Visit: Payer: Self-pay | Admitting: Family Medicine

## 2021-11-23 DIAGNOSIS — E039 Hypothyroidism, unspecified: Secondary | ICD-10-CM

## 2021-11-23 DIAGNOSIS — R600 Localized edema: Secondary | ICD-10-CM | POA: Diagnosis not present

## 2021-11-23 DIAGNOSIS — E1149 Type 2 diabetes mellitus with other diabetic neurological complication: Secondary | ICD-10-CM

## 2021-11-23 LAB — TSH: TSH: 87.6 u[IU]/mL — ABNORMAL HIGH (ref 0.35–5.50)

## 2021-11-23 LAB — BASIC METABOLIC PANEL
BUN: 59 mg/dL — ABNORMAL HIGH (ref 6–23)
CO2: 27 mEq/L (ref 19–32)
Calcium: 9.6 mg/dL (ref 8.4–10.5)
Chloride: 98 mEq/L (ref 96–112)
Creatinine, Ser: 3.41 mg/dL — ABNORMAL HIGH (ref 0.40–1.50)
GFR: 15.77 mL/min — ABNORMAL LOW (ref >60.00)
Glucose, Bld: 80 mg/dL (ref 70–99)
Potassium: 4.3 mEq/L (ref 3.5–5.1)
Sodium: 137 mEq/L (ref 135–145)

## 2021-11-23 LAB — T4, FREE: Free T4: 0.41 ng/dL — ABNORMAL LOW (ref 0.60–1.60)

## 2021-11-23 LAB — HEMOGLOBIN A1C: Hgb A1c MFr Bld: 6.8 % — ABNORMAL HIGH (ref 4.6–6.5)

## 2021-11-23 MED ORDER — LEVOTHYROXINE SODIUM 100 MCG PO TABS: 100.0000 ug | ORAL_TABLET | Freq: Every day | ORAL | 3 refills | Status: DC

## 2021-11-23 NOTE — Telephone Encounter (Signed)
Hh wanted report they have been trying to schedule an appt for PT for 3 weeks and when she showed up today they did not answer the door. When she called them they stated they want to continue PT but thought they had cancelled the appt for today.

## 2021-11-23 NOTE — Telephone Encounter (Signed)
[facility] calling to advise they will discharge patient from care. They spoke to patient's daughter and according to her there is a lot going on and they will revisit home health in a few weeks.

## 2021-11-25 ENCOUNTER — Telehealth: Payer: Self-pay | Admitting: Family Medicine

## 2021-11-25 NOTE — Telephone Encounter (Signed)
Printed from [employer] OT evaluation from [employer]. PCP signed and faxed to [phone] and [phone] Received fax confirmation.

## 2021-12-18 ENCOUNTER — Other Ambulatory Visit: Payer: Self-pay | Admitting: Family Medicine

## 2021-12-19 ENCOUNTER — Other Ambulatory Visit: Payer: Self-pay | Admitting: Family Medicine

## 2022-01-24 ENCOUNTER — Telehealth: Payer: Self-pay | Admitting: Family Medicine

## 2022-01-24 NOTE — Telephone Encounter (Signed)
Eric Herring called and lvm to return call

## 2022-01-24 NOTE — Telephone Encounter (Signed)
Daughter, [name], calling to see if Dr. [name] can place order for Home Health aide to come out to assist her father with bathing and preparing meals as she just had surgery and cannot get over to his house as much. She said the insurance said they will cover up to so many hours each week. Please call her to advise. [phone]

## 2022-01-26 NOTE — Telephone Encounter (Signed)
Appt scheduled for 02/01/22

## 2022-01-30 DIAGNOSIS — E1140 Type 2 diabetes mellitus with diabetic neuropathy, unspecified: Secondary | ICD-10-CM | POA: Diagnosis not present

## 2022-01-30 DIAGNOSIS — I739 Peripheral vascular disease, unspecified: Secondary | ICD-10-CM | POA: Diagnosis not present

## 2022-01-30 DIAGNOSIS — B351 Tinea unguium: Secondary | ICD-10-CM | POA: Diagnosis not present

## 2022-02-01 ENCOUNTER — Ambulatory Visit (INDEPENDENT_AMBULATORY_CARE_PROVIDER_SITE_OTHER): Payer: Medicare HMO | Admitting: Family Medicine

## 2022-02-01 ENCOUNTER — Encounter: Payer: Self-pay | Admitting: Family Medicine

## 2022-02-01 ENCOUNTER — Ambulatory Visit: Payer: Medicare HMO

## 2022-02-01 VITALS — BP 100/68 | HR 78 | Temp 97.8°F | Ht 69.0 in | Wt 123.4 lb

## 2022-02-01 DIAGNOSIS — Z23 Encounter for immunization: Secondary | ICD-10-CM | POA: Diagnosis not present

## 2022-02-01 DIAGNOSIS — Z0000 Encounter for general adult medical examination without abnormal findings: Secondary | ICD-10-CM | POA: Diagnosis not present

## 2022-02-01 DIAGNOSIS — L03313 Cellulitis of chest wall: Secondary | ICD-10-CM

## 2022-02-01 DIAGNOSIS — R5381 Other malaise: Secondary | ICD-10-CM | POA: Diagnosis not present

## 2022-02-01 DIAGNOSIS — E1149 Type 2 diabetes mellitus with other diabetic neurological complication: Secondary | ICD-10-CM

## 2022-02-01 DIAGNOSIS — H6123 Impacted cerumen, bilateral: Secondary | ICD-10-CM

## 2022-02-01 LAB — COMPREHENSIVE METABOLIC PANEL
ALT: 10 U/L (ref 0–53)
AST: 15 U/L (ref 0–37)
Albumin: 4 g/dL (ref 3.5–5.2)
Alkaline Phosphatase: 67 U/L (ref 39–117)
BUN: 61 mg/dL — ABNORMAL HIGH (ref 6–23)
CO2: 27 mEq/L (ref 19–32)
Calcium: 9.6 mg/dL (ref 8.4–10.5)
Chloride: 100 mEq/L (ref 96–112)
Creatinine, Ser: 3.06 mg/dL — ABNORMAL HIGH (ref 0.40–1.50)
GFR: 17.93 mL/min — ABNORMAL LOW (ref >60.00)
Glucose, Bld: 82 mg/dL (ref 70–99)
Potassium: 4.1 mEq/L (ref 3.5–5.1)
Sodium: 137 mEq/L (ref 135–145)
Total Bilirubin: 0.8 mg/dL (ref 0.2–1.2)
Total Protein: 7.9 g/dL (ref 6.0–8.3)

## 2022-02-01 LAB — LIPID PANEL
Cholesterol: 145 mg/dL (ref 0–200)
HDL: 41.2 mg/dL (ref >39.00)
LDL Cholesterol: 89 mg/dL (ref 0–99)
NonHDL: 103.48
Total CHOL/HDL Ratio: 4
Triglycerides: 74 mg/dL (ref 0.0–149.0)
VLDL: 14.8 mg/dL (ref 0.0–40.0)

## 2022-02-01 MED ORDER — DOXYCYCLINE HYCLATE 100 MG PO TABS: 100.0000 mg | ORAL_TABLET | Freq: Two times a day (BID) | ORAL | 0 refills | Status: AC

## 2022-02-01 NOTE — Progress Notes (Signed)
Chief Complaint  Patient presents with   Annual Exam    Well Male Eric Herring is here for a complete physical.  Here w daughter.  His last physical was >1 year ago.  Current diet: in general, a "healthy" diet.   Current exercise: none Weight trend: decreased; a lot of swelling has gone down Fatigue out of ordinary? No. Seat belt? Yes.   Advanced directive? Yes  Health maintenance Shingrix- No Tetanus- No Pneumonia vaccine- Yes  Hearing loss: hx of wax, trouble hearing over the past 2 weeks. Water got in his ear and seems to have never come out. No pain or drainage.   Skin lesion The patient has a history of a cyst on his chest.  Over the past 3 weeks, it is become increasingly painful.  He denies any fevers, slight drainage.  No close contacts with similar symptoms.  No new lotions, soaps, topicals, or detergents.  He denies any fevers.  Physical deconditioning The patient cannot walk without a walker.  He does not leave the house frequently because of this.  He is reliant upon his adult children for help getting around.  Family is requesting a home health aide and home physical therapy to help with this.  Past Medical History:  Diagnosis Date   Acute on chronic renal insufficiency 12/11/2017   Allergy    Aortic atherosclerosis (HCC)    Arthropathy    CAD (coronary artery disease)    Cardiomyopathy ([place])    Chronic kidney disease    Chronic renal failure 16/11/3708   Chronic systolic congestive heart failure ([place]) 01/23/2019   Colon polyp    COPD (chronic obstructive pulmonary disease) (HCC)    Diabetes (HCC)    Diabetic neuropathy (HCC)    Dilated cardiomyopathy ([place]) 11/20/2017   Heart murmur    Hypercholesterolemia    Hypertension    pulmonary   LVH (left ventricular hypertrophy)    Lymphoma (HCC)    Metabolic bone disease 62/69/4854   Mitral regurgitation    Prostate cancer ([place])    Pulmonary hypertension (HCC)    Tricuspid regurgitation    Type 2  diabetes mellitus with neurological complications ([place]) 62/70/3500   Vitamin D deficiency 12/12/2019     Past Surgical History:  Procedure Laterality Date   HEMORROIDECTOMY     pt does not remember year    Medications  Current Outpatient Medications on File Prior to Visit  Medication Sig Dispense Refill   albuterol (PROVENTIL) (2.5 MG/3ML) 0.083% nebulizer solution USE 1 VIAL IN NEBULIZER EVERY 6 HOURS AS NEEDED FOR WHEEZING FOR SHORTNESS OF BREATH (Patient taking differently: Take 2.5 mg by nebulization every 4 (four) hours as needed for wheezing or shortness of breath. USE 1 VIAL IN NEBULIZER EVERY 6 HOURS AS NEEDED FOR WHEEZING FOR SHORTNESS OF BREATH) 75 mL 5   albuterol (VENTOLIN HFA) 108 (90 Base) MCG/ACT inhaler Inhale 2 puffs into the lungs every 6 (six) hours as needed for wheezing or shortness of breath. 180 g 2   allopurinol (ZYLOPRIM) 100 MG tablet Take 1 tablet by mouth once daily 30 tablet 0   ammonium lactate (LAC-HYDRIN) 12 % cream Apply topically as needed for dry skin. (Patient taking differently: Apply 1 g topically as needed for dry skin.) 385 g 0   aspirin 81 MG tablet Take 81 mg by mouth daily.     Calcium Carb-Cholecalciferol (CALCIUM-VITAMIN D) 500-200 MG-UNIT tablet Take 1 tablet by mouth daily.      fexofenadine (ALLEGRA ALLERGY)  180 MG tablet Take 1 tablet (180 mg total) by mouth daily. 30 tablet 5   Fluticasone-Umeclidin-Vilant (TRELEGY ELLIPTA) 100-62.5-25 MCG/INH AEPB Inhale 1 puff into the lungs daily. 30 each 5   furosemide (LASIX) 80 MG tablet Take one and 1/2 tablet (120 mg ) in the morning and one and 1/2 tablets afternoon. 360 tablet 1   isosorbide mononitrate (IMDUR) 30 MG 24 hr tablet TAKE 1 TABLET EVERY DAY (Patient taking differently: Take 30 mg by mouth daily. Unknown strength) 90 tablet 1   levothyroxine (SYNTHROID) 100 MCG tablet Take 1 tablet (100 mcg total) by mouth daily. 30 tablet 3   Multiple Vitamin (MULTIVITAMIN) tablet Take 1 tablet by mouth.       NON FORMULARY [place] pads     PACERONE 200 MG tablet Take 1 tablet by mouth once daily (Patient taking differently: Take 200 mg by mouth 2 (two) times daily.) 90 tablet 0   pravastatin (PRAVACHOL) 20 MG tablet Take 1 tablet by mouth once daily 90 tablet 0    Allergies Allergies  Allergen Reactions   Lisinopril Anaphylaxis and Swelling         Family History Family History  Problem Relation Age of Onset   COPD Father     Review of Systems: Constitutional:  no fevers Eye:  no recent significant change in vision Ears:  + Hearing loss as noted above Nose/Mouth/Throat:  no complaints of nasal congestion, no sore throat Cardiovascular: no chest pain Respiratory:  No shortness of breath Gastrointestinal:  No change in bowel habits GU:  No frequency Integumentary:  + Cyst on chest Neurologic:  no headaches Endocrine:  denies unexplained weight changes  Exam BP 100/68 (BP Location: Left Arm, Patient Position: Sitting, Cuff Size: Normal)   Pulse 78   Temp 97.8 F (36.6 C) (Oral)   Ht '5\' 9"'$  (1.753 m)   Wt 123 lb 6 oz (56 kg)   SpO2 92%   BMI 18.22 kg/m  General:  well developed, well nourished, in no apparent distress Skin: Elliptically shaped lesion on the left medial chest wall measuring approximately 2.5 x 4 cm.  It is tender to palpation with a small amount of purulent drainage.  There is overlying erythema and warmth.  Otherwise, no significant moles, warts, or growths Head:  no masses, lesions, or tenderness Eyes:  pupils equal and round, sclera anicteric without injection Ears:  canals 100% obstructed with cerumen bilaterally Nose:  nares patent, mucosa normal Throat/Pharynx:  lips and gingiva without lesion; tongue and uvula midline; non-inflamed pharynx; no exudates or postnasal drainage Lungs:  clear to auscultation, breath sounds equal bilaterally, no respiratory distress Cardio:  regular rate and rhythm, no LE edema or bruits Rectal: Deferred GI: BS+, S, NT,  ND, no masses or organomegaly Musculoskeletal:  symmetrical muscle groups noted with muscle wasting noted equally in his lower extremities Neuro: Not ambulatory, 4/5 strength in the lower extremities throughout, 5/5 strength in the upper extremities Psych: well oriented with normal range of affect and appropriate judgment/insight  Assessment and Plan  Well adult exam  Type 2 diabetes mellitus with neurological complications ([place]) - Plan: Comprehensive metabolic panel, Lipid panel  Need for influenza vaccination - Plan: Flu Vaccine QUAD High Dose(Fluad)  Cellulitis of chest wall  Physical deconditioning - Plan: Ambulatory referral to [place]  Bilateral impacted cerumen   Well 87 y.o. male. Counseled on diet and exercise. Advanced directive form provided today.  Flu shot today.  We will check labs. Cellulitis of  chest wall/cyst: Hopefully self-limiting, will trial 7 days doxycycline 100 mg twice daily.  Follow-up in around 5 days if no improvement and we will have to perform an I&D. Cerumen impaction: Flushed today.  Home care instructions verbalized and written down. Physical deconditioning: Home health ordered for a physical therapist and home health aide.  If they are looking for in depth care, they may have to go and pay out-of-pocket for home health agency. Other orders as above. Follow up in 6 mo.  The patient and his daughter voiced understanding and agreement to the plan.  [place], DO 02/01/22 11:28 AM

## 2022-02-01 NOTE — Patient Instructions (Addendum)
Give us 2-3 business days to get the results of your labs back.   Keep the diet clean and stay active.  The Shingrix vaccine (for shingles) is a 2 shot series spaced 2-6 months apart. It can make people feel low energy, achy and almost like they have the flu for 48 hours after injection. 1/5 people can have nausea and/or vomiting. Please plan accordingly when deciding on when to get this shot. Call our office for a nurse visit appointment to get this. The second shot of the series is less severe regarding the side effects, but it still lasts 48 hours.   Please inquire about the tetanus booster while you are at the pharmacy as the insurances associated with Medicare will not cover it here in the clinic.  Please get me a copy of your advanced directive form at your convenience.   If you do not hear anything about your referral in the next 1-2 weeks, call our office and ask for an update.  OK to use Debrox (peroxide) in the ear to loosen up wax. Also recommend using a bulb syringe (for removing boogers from baby's noses) to flush through warm water and vinegar (3-4:1 ratio). An alternative, though more expensive, is an elephant ear washer wax removal kit. Do not use Q-tips as this can impact wax further.  Please schedule your eye exam.   Foods that may reduce pain: 1) Ginger 2) Blueberries 3) Salmon 4) Pumpkin seeds 5) dark chocolate 6) turmeric 7) tart cherries 8) virgin olive oil 9) chilli peppers 10) mint 11) krill oil  Let us know if you need anything.  

## 2022-02-03 ENCOUNTER — Telehealth: Payer: Self-pay | Admitting: Family Medicine

## 2022-02-03 NOTE — Telephone Encounter (Signed)
Caller/Agency: [place] Number: [phone] Requesting OT/PT/Skilled Nursing/Social Work/Speech Therapy: PT Frequency: 1 x 8 OC eval Social work eval

## 2022-02-03 NOTE — Telephone Encounter (Signed)
Called [facility] gave verbal ok per pcp

## 2022-02-03 NOTE — Telephone Encounter (Signed)
OK 

## 2022-02-06 ENCOUNTER — Ambulatory Visit: Payer: Medicare HMO | Admitting: Family Medicine

## 2022-02-10 ENCOUNTER — Ambulatory Visit: Payer: Medicare HMO

## 2022-03-06 ENCOUNTER — Ambulatory Visit: Payer: Medicare HMO | Admitting: Family Medicine

## 2022-03-09 ENCOUNTER — Telehealth: Payer: Self-pay

## 2022-03-09 NOTE — Telephone Encounter (Signed)
No notes in chart , could have been an appt reminder for his appt on 03/14/22

## 2022-03-09 NOTE — Telephone Encounter (Signed)
Caller Name [place] Phone Number [phone] Patient Name Eric Herring Patient DOB May 05, 1935 Call Type Message Only Information Provided Reason for Call Returning a Call from the Office Initial Comment Caller states she missed a call. Disp. Time Disposition Final User 03/09/2022 12:39:49 PM General Information Provided Yes Dara Hoyer Call Closed By: Dara Hoyer Transaction Date/Time: 03/09/2022 12:37:45 PM (ET)

## 2022-03-14 ENCOUNTER — Ambulatory Visit (INDEPENDENT_AMBULATORY_CARE_PROVIDER_SITE_OTHER): Payer: Medicare HMO | Admitting: Family Medicine

## 2022-03-14 ENCOUNTER — Encounter: Payer: Self-pay | Admitting: Family Medicine

## 2022-03-14 VITALS — BP 125/80 | HR 46 | Temp 97.5°F | Ht 68.0 in | Wt 136.0 lb

## 2022-03-14 DIAGNOSIS — L720 Epidermal cyst: Secondary | ICD-10-CM

## 2022-03-14 NOTE — Progress Notes (Signed)
Chief Complaint  Patient presents with   spot on chest to check     Eric Herring is a 87 y.o. male here for a skin complaint. Here w daughter [name].   Duration: 9 weeks Location: chest Pruritic? No Painful? No Drainage? Some earlier on and bleeding, not lately Other associated symptoms: no fevers or spreading Therapies tried thus far: doxy helped w pain and redness  Past Medical History:  Diagnosis Date   Acute on chronic renal insufficiency 12/11/2017   Allergy    Aortic atherosclerosis (HCC)    Arthropathy    CAD (coronary artery disease)    Cardiomyopathy ([place])    Chronic kidney disease    Chronic renal failure 92/03/9416   Chronic systolic congestive heart failure ([place]) 01/23/2019   Colon polyp    COPD (chronic obstructive pulmonary disease) (HCC)    Diabetes (HCC)    Diabetic neuropathy (HCC)    Dilated cardiomyopathy ([place]) 11/20/2017   Heart murmur    Hypercholesterolemia    Hypertension    pulmonary   LVH (left ventricular hypertrophy)    Lymphoma (HCC)    Metabolic bone disease 40/81/4481   Mitral regurgitation    Prostate cancer (HCC)    Pulmonary hypertension (HCC)    Tricuspid regurgitation    Type 2 diabetes mellitus with neurological complications (HCC) 85/63/1497   Vitamin D deficiency 12/12/2019    BP 125/80 (BP Location: Left Arm, Patient Position: Sitting, Cuff Size: Normal)   Pulse (!) 46   Temp (!) 97.5 F (36.4 C) (Oral)   Ht '5\' 8"'$  (1.727 m)   Wt 136 lb (61.7 kg)   SpO2 96%   BMI 20.68 kg/m  Gen: awake, alert, appearing stated age Lungs: No accessory muscle use Skin: see below. No drainage, erythema, TTP, fluctuance, excoriation Psych: Age appropriate judgment and insight   L anterior chest wall  Epidermoid cyst of skin of chest - Plan: Ambulatory referral to General Surgery  Slightly bothersome to him, no signs of infection. Will refer to gen surg to discuss removal. Given the tempo, this is not particularly worrisome for  malignancy. Keep c/d. If changes/worsening, they will let me know.  F/u prn. The patient and his daughter voiced understanding and agreement to the plan.  [place], DO 03/14/22 11:30 AM

## 2022-03-14 NOTE — Patient Instructions (Signed)
If you do not hear anything about your referral in the next 1-2 weeks, call our office and ask for an update.  This looks good from an infection standpoint.   Keep this clean and dry.   Let us know if you need anything.

## 2022-04-14 ENCOUNTER — Other Ambulatory Visit: Payer: Self-pay | Admitting: Family Medicine

## 2022-05-05 ENCOUNTER — Other Ambulatory Visit: Payer: Self-pay | Admitting: Family Medicine

## 2022-05-24 ENCOUNTER — Ambulatory Visit: Payer: Medicare HMO | Admitting: Family Medicine

## 2022-05-30 ENCOUNTER — Other Ambulatory Visit: Payer: Self-pay | Admitting: Family Medicine

## 2022-05-30 ENCOUNTER — Encounter: Payer: Self-pay | Admitting: Family Medicine

## 2022-05-30 ENCOUNTER — Ambulatory Visit (INDEPENDENT_AMBULATORY_CARE_PROVIDER_SITE_OTHER): Payer: Medicare HMO | Admitting: Family Medicine

## 2022-05-30 VITALS — BP 118/78 | HR 95 | Temp 98.0°F | Ht 72.0 in | Wt 138.5 lb

## 2022-05-30 DIAGNOSIS — R300 Dysuria: Secondary | ICD-10-CM

## 2022-05-30 DIAGNOSIS — M7989 Other specified soft tissue disorders: Secondary | ICD-10-CM | POA: Diagnosis not present

## 2022-05-30 DIAGNOSIS — R0600 Dyspnea, unspecified: Secondary | ICD-10-CM | POA: Diagnosis not present

## 2022-05-30 NOTE — Patient Instructions (Addendum)
Give [place] 2-3 business days to get the results of your labs back.   For the swelling in your lower extremities, be sure to elevate your legs when able, mind the salt intake, stay physically active and consider wearing compression stockings.  Give [place] 2-3 business days to get the results of your labs back.   Please get your X-ray done in the basement of our [place] office located on: [address] [place], [place] [address]  You do not need an appointment for that location.   We will be in touch with your X-ray results.   Let us know if you need anything.

## 2022-05-30 NOTE — Progress Notes (Signed)
Chief Complaint  Patient presents with   swelling genitals, urine infection    Chest congestion     Subjective: Patient is a 88 y.o. male here for LE swelling.  He is here with his daughter who helps provide the history.  Over the past week, the patient has had shortness of breath, productive cough with frothy/whitish sputum, lower extremity swelling, swelling of his penis,, and pain in his lower extremities.  He reports compliance with his Lasix 120 mg twice daily.  He does urinate within minutes after taking his dosage.  He denies any change in his physical activity or diet.  He has associated burning with urination.  No fevers, wheezing, runny/stuffy nose, sore throat.  He has been having swelling of his penis for the past week.  It is steadily improving.  He will sometimes use topical cinnamon over his feet to help with neuropathy.  Some may have gotten on his penis and caused burning as well.  That is also improving.  Past Medical History:  Diagnosis Date   Acute on chronic renal insufficiency 12/11/2017   Allergy    Aortic atherosclerosis (HCC)    Arthropathy    CAD (coronary artery disease)    Cardiomyopathy ([place])    Chronic kidney disease    Chronic renal failure 09/47/0962   Chronic systolic congestive heart failure ([place]) 01/23/2019   Colon polyp    COPD (chronic obstructive pulmonary disease) (HCC)    Diabetes (HCC)    Diabetic neuropathy (HCC)    Dilated cardiomyopathy ([place]) 11/20/2017   Heart murmur    Hypercholesterolemia    Hypertension    pulmonary   LVH (left ventricular hypertrophy)    Lymphoma (HCC)    Metabolic bone disease 83/66/2947   Mitral regurgitation    Prostate cancer (HCC)    Pulmonary hypertension (HCC)    Tricuspid regurgitation    Type 2 diabetes mellitus with neurological complications (HCC) 65/46/5035   Vitamin D deficiency 12/12/2019    Objective: BP 118/78 (BP Location: Left Arm, Patient Position: Sitting, Cuff Size: Normal)   Pulse 95    Temp 98 F (36.7 C) (Oral)   Ht 6' (1.829 m)   Wt 138 lb 8 oz (62.8 kg)   BMI 18.78 kg/m  General: Awake, appears stated age GU: Mild swelling of shaft without ttp, erythema, drainage; no scrotal swelling Heart: RRR, 3+ pitting bilateral lower extremity edema tapering at the knees Lungs: Decreased breath sounds at the bases, no rales, wheezes or rhonchi. No accessory muscle use Psych: normal affect and mood  Assessment and Plan: Dysuria - Plan: Urine Microscopic Only, Urine Culture  Dyspnea, unspecified type - Plan: DG Chest 2 View  Localized swelling of both lower extremities - Plan: Basic metabolic panel  Check urine studies.  Will check a chest x-ray given decreased breath sounds and clinical findings/history suggestive of CHF exacerbation.  For now, I would like to continue his 120 mg twice daily of Lasix while we check his renal function.  May have to change to a different diuretic. Reassurance for his penis.  The patient and his daughter voiced understanding and agreement to the plan.  [place], DO 05/30/22  1:57 PM

## 2022-05-31 ENCOUNTER — Other Ambulatory Visit: Payer: Self-pay | Admitting: Family Medicine

## 2022-05-31 ENCOUNTER — Telehealth: Payer: Self-pay

## 2022-05-31 LAB — URINE CULTURE
MICRO NUMBER:: 14461026
SPECIMEN QUALITY:: ADEQUATE

## 2022-05-31 LAB — URINALYSIS, MICROSCOPIC ONLY

## 2022-05-31 LAB — BASIC METABOLIC PANEL
BUN: 72 mg/dL — ABNORMAL HIGH (ref 6–23)
CO2: 24 mEq/L (ref 19–32)
Calcium: 9.4 mg/dL (ref 8.4–10.5)
Chloride: 103 mEq/L (ref 96–112)
Creatinine, Ser: 3.87 mg/dL — ABNORMAL HIGH (ref 0.40–1.50)
GFR: 13.5 mL/min — CL (ref >60.00)
Glucose, Bld: 91 mg/dL (ref 70–99)
Potassium: 4.3 mEq/L (ref 3.5–5.1)
Sodium: 139 mEq/L (ref 135–145)

## 2022-05-31 MED ORDER — TORSEMIDE 20 MG PO TABS: 60.0000 mg | ORAL_TABLET | Freq: Two times a day (BID) | ORAL | 1 refills | Status: DC

## 2022-05-31 NOTE — Telephone Encounter (Signed)
CRITICAL VALUE STICKER  CRITICAL VALUE: GFR 13.50  RECEIVER (on-site recipient of call): Hammond Obeirne, RMA  DATE & TIME NOTIFIED: 05/31/2022 at 12:27 pm  MESSENGER (representative from lab): Festus Holts  MD NOTIFIED: Riki Sheer, DO  TIME OF NOTIFICATION: 1:04 pm  RESPONSE:

## 2022-06-05 DIAGNOSIS — I739 Peripheral vascular disease, unspecified: Secondary | ICD-10-CM | POA: Diagnosis not present

## 2022-06-05 DIAGNOSIS — E1142 Type 2 diabetes mellitus with diabetic polyneuropathy: Secondary | ICD-10-CM | POA: Diagnosis not present

## 2022-06-05 DIAGNOSIS — B351 Tinea unguium: Secondary | ICD-10-CM | POA: Diagnosis not present

## 2022-06-06 ENCOUNTER — Ambulatory Visit: Payer: Medicare HMO | Admitting: Family Medicine

## 2022-06-16 DIAGNOSIS — Z7982 Long term (current) use of aspirin: Secondary | ICD-10-CM | POA: Diagnosis not present

## 2022-06-16 DIAGNOSIS — E1122 Type 2 diabetes mellitus with diabetic chronic kidney disease: Secondary | ICD-10-CM | POA: Diagnosis not present

## 2022-06-16 DIAGNOSIS — N189 Chronic kidney disease, unspecified: Secondary | ICD-10-CM | POA: Diagnosis not present

## 2022-06-16 DIAGNOSIS — I739 Peripheral vascular disease, unspecified: Secondary | ICD-10-CM | POA: Diagnosis not present

## 2022-06-16 DIAGNOSIS — I509 Heart failure, unspecified: Secondary | ICD-10-CM | POA: Diagnosis not present

## 2022-06-16 DIAGNOSIS — Z87891 Personal history of nicotine dependence: Secondary | ICD-10-CM | POA: Diagnosis not present

## 2022-06-16 DIAGNOSIS — E785 Hyperlipidemia, unspecified: Secondary | ICD-10-CM | POA: Diagnosis not present

## 2022-07-03 DIAGNOSIS — I739 Peripheral vascular disease, unspecified: Secondary | ICD-10-CM | POA: Diagnosis not present

## 2022-07-10 ENCOUNTER — Ambulatory Visit (INDEPENDENT_AMBULATORY_CARE_PROVIDER_SITE_OTHER)
Admission: RE | Admit: 2022-07-10 | Discharge: 2022-07-10 | Disposition: A | Discharge status: Home / Self Care | Payer: Medicare HMO | Source: Ambulatory Visit | Attending: Family Medicine | Admitting: Family Medicine

## 2022-07-10 ENCOUNTER — Other Ambulatory Visit: Payer: Self-pay | Admitting: Family Medicine

## 2022-07-10 DIAGNOSIS — R0600 Dyspnea, unspecified: Secondary | ICD-10-CM

## 2022-07-10 DIAGNOSIS — R918 Other nonspecific abnormal finding of lung field: Secondary | ICD-10-CM

## 2022-07-10 DIAGNOSIS — J439 Emphysema, unspecified: Secondary | ICD-10-CM | POA: Diagnosis not present

## 2022-07-11 ENCOUNTER — Telehealth: Payer: Self-pay | Admitting: Family Medicine

## 2022-07-11 MED ORDER — TORSEMIDE 20 MG PO TABS: 60.0000 mg | ORAL_TABLET | Freq: Two times a day (BID) | ORAL | 1 refills | Status: DC

## 2022-07-11 MED ORDER — ALLOPURINOL 100 MG PO TABS: 100.0000 mg | ORAL_TABLET | Freq: Every day | ORAL | 1 refills | Status: DC

## 2022-07-11 MED ORDER — AMIODARONE HCL 200 MG PO TABS: 200.0000 mg | ORAL_TABLET | Freq: Every day | ORAL | 1 refills | Status: DC

## 2022-07-11 MED ORDER — PRAVASTATIN SODIUM 20 MG PO TABS: 20.0000 mg | ORAL_TABLET | Freq: Every day | ORAL | 1 refills | Status: DC

## 2022-07-11 MED ORDER — ALBUTEROL SULFATE HFA 108 (90 BASE) MCG/ACT IN AERS: INHALATION_SPRAY | RESPIRATORY_TRACT | 1 refills | Status: DC

## 2022-07-11 MED ORDER — LEVOTHYROXINE SODIUM 100 MCG PO TABS: 100.0000 ug | ORAL_TABLET | Freq: Every day | ORAL | 1 refills | Status: DC

## 2022-07-11 NOTE — Telephone Encounter (Signed)
Pt's daughter stated it takes the pharmacy too long to call in for the rx and she is needing all of his medications filled.  [address] [place], [place] [address] Phone: [phone]  Fax: [phone]

## 2022-07-11 NOTE — Telephone Encounter (Signed)
Spoke to the Daughter/refilled requested prescriptions.

## 2022-07-21 ENCOUNTER — Telehealth: Payer: Self-pay | Admitting: Family Medicine

## 2022-07-21 DIAGNOSIS — R0600 Dyspnea, unspecified: Secondary | ICD-10-CM

## 2022-07-21 DIAGNOSIS — I509 Heart failure, unspecified: Secondary | ICD-10-CM

## 2022-07-21 DIAGNOSIS — J449 Chronic obstructive pulmonary disease, unspecified: Secondary | ICD-10-CM

## 2022-07-21 NOTE — Telephone Encounter (Signed)
Pt's daughter stated she has a surgery coming up on 3/22 and has been trying to find a living facility for her dad. No one has been able to accept him and she is not sure what to do. She stated the only thing he is able to do on his own is feed himself, and she has to take care if him day and night as she is his only care taker. Also she stated she never heard anything back from his imaging on 3/4 and did not receive a MyChart message.

## 2022-07-21 NOTE — Telephone Encounter (Signed)
Apologies about X-ray. Not sure what happened, I thought a message had been sent. Chronic lung changes noted. Area of interested recommended by radiology to get a CT scan for more info. Order had been placed, someone should reach out relatively soon.   Logistically, if nobody will accept him and she is unable to care for him (FTT), then he may have to go to the ER. Do we have any social resources to help with this?

## 2022-07-21 NOTE — Telephone Encounter (Signed)
Called informed daughter of results/instructions. She verbalized understanding. I put in a referral for Care Coordination through [facility] as urgent within 3 days.

## 2022-07-21 NOTE — Addendum Note (Signed)
Addended by: Sharon Seller B on: 07/21/2022 03:10 PM   Modules accepted: Orders

## 2022-07-24 ENCOUNTER — Telehealth: Payer: Self-pay | Admitting: *Deleted

## 2022-07-24 NOTE — Progress Notes (Signed)
  Care Coordination   Note   07/24/2022 Name: Eric Herring MRN: [id number] DOB: Jan 08, 1935  Eric Herring is a 88 y.o. year old male who sees [name], [name], DO for primary care. I reached out to [name] by phone today to offer care coordination services.  [name] was given information about Care Coordination services today including:   The Care Coordination services include support from the care team which includes your Nurse Coordinator, Clinical Social Worker, or Pharmacist.  The Care Coordination team is here to help remove barriers to the health concerns and goals most important to you. Care Coordination services are voluntary, and the patient may decline or stop services at any time by request to their care team member.   Care Coordination Consent Status: Patient agreed to services and verbal consent obtained.   Follow up plan:  Telephone appointment with care coordination team member scheduled for:  07/25/2022  Encounter Outcome:  Pt. Scheduled from referral   Julian Hy, [place] Direct Dial: [phone]

## 2022-07-25 ENCOUNTER — Ambulatory Visit: Payer: Self-pay | Admitting: Licensed Clinical Social Worker

## 2022-07-25 NOTE — Patient Instructions (Signed)
Visit Information  Thank you for taking time to visit with me today. Please don't hesitate to contact me if I can be of assistance to you.   Following are the goals we discussed today:   Goals Addressed             This Visit's Progress    Caregiver support for Facility Placement       Activities and task to complete in order to accomplish goals.  Daughter will assist with activities Call or go to Department of Social Services to apply for long-term care Medicaid  Review brochure on Medicaid tips  Review facilities from the list provided call and go visit Review booklet ''When a loved one need long-term care" ( this was e-mailed per dau. request)  Keep appointment 07/31/22 with PCP Social work will collaborate with PCP for [facility] and other needed items for placement        Our next appointment is by telephone on 08/04/22 at 9:45  Please call the care guide team at [phone] if you need to cancel or reschedule your appointment.   Patient verbalizes understanding of instructions and care plan provided today and agrees to view in [place]. Active MyChart status and patient understanding of how to access instructions and care plan via MyChart confirmed with patient.     Casimer Lanius, [place] [phone]

## 2022-07-25 NOTE — Patient Outreach (Signed)
  Care Coordination  Initial Visit Note   07/25/2022 Name: Eric Herring MRN: [id number] DOB: 04-29-1935  Eric Herring is a 88 y.o. year old male who sees Eric Herring, Eric Oyster, DO for primary care. I spoke with  Eric Herring 's daughter Eric Herring by phone today.  What matters to the patients health and wellness today?  Assistance with facility placement   Per daughter patient is unable to meet his ADL's due to not being able to walk.  Due to her health daughter is unable to continue to meet patient's ongoing needs. Daughter thinks patient would benefit from Short term rehab. So that he will be able to walk.  If he is unable to get short term rehab she will explore long-term care.  Per daughter she has spoken with patient and he is in agreement.   Goals Addressed             This Visit's Progress    Caregiver support for Facility Placement       Activities and task to complete in order to accomplish goals.  Daughter will assist with activities Call or go to Department of Social Services to apply for long-term care Medicaid  Review brochure on Medicaid tips  Review facilities from the list provided call and go visit Review booklet ''When a loved one need long-term care" ( this was e-mailed per dau. request)  Keep appointment 07/31/22 with PCP Social work will collaborate with PCP for [employer] [facility] and other needed items for placement        SDOH assessments and interventions completed:  Yes  SDOH Interventions Today    Flowsheet Row Most Recent Value  SDOH Interventions   Food Insecurity Interventions Intervention Not Indicated  Housing Interventions Intervention Not Indicated  Transportation Interventions Intervention Not Indicated  Utilities Interventions Intervention Not Indicated  Financial Strain Interventions Intervention Not Indicated       Care Coordination Interventions:  Yes, provided  Interventions Today    Flowsheet Row Most Recent Value  Chronic Disease    Chronic disease during today's visit Congestive Heart Failure (CHF), Chronic Obstructive Pulmonary Disease (COPD), Diabetes, Chronic Kidney Disease/End Stage Renal Disease (ESRD)  General Interventions   General Interventions Discussed/Reviewed General Interventions Discussed, Level of Care, Communication with  Communication with PCP/Specialists  [ref. placement and Rehab.]  Level of Care [place]  [discussed process]  Education Interventions   Education Provided Provided Web-based Education, Provided Education  Provided Verbal Education On Other  [facility placement and Medicaid]  Mental Health Interventions   Mental Health Discussed/Reviewed Coping Strategies, Mental Health Reviewed  [caregiver stress]  Advanced Directive Interventions   Advanced Directives Discussed/Reviewed Advanced Directives Discussed  [per dau. she has HCPOA  will bring copy of documents]       Follow up plan: Follow up call scheduled for 08/04/22    Encounter Outcome:  Pt. Visit Completed   Eric Herring, [place] [phone]

## 2022-07-31 ENCOUNTER — Ambulatory Visit (INDEPENDENT_AMBULATORY_CARE_PROVIDER_SITE_OTHER): Payer: Medicare HMO | Admitting: Family Medicine

## 2022-07-31 ENCOUNTER — Encounter: Payer: Self-pay | Admitting: Family Medicine

## 2022-07-31 ENCOUNTER — Ambulatory Visit: Payer: Self-pay | Admitting: Licensed Clinical Social Worker

## 2022-07-31 ENCOUNTER — Telehealth: Payer: Self-pay

## 2022-07-31 VITALS — BP 122/78 | HR 80 | Temp 98.0°F | Ht 68.0 in | Wt 138.2 lb

## 2022-07-31 DIAGNOSIS — R234 Changes in skin texture: Secondary | ICD-10-CM

## 2022-07-31 DIAGNOSIS — E039 Hypothyroidism, unspecified: Secondary | ICD-10-CM

## 2022-07-31 DIAGNOSIS — E1149 Type 2 diabetes mellitus with other diabetic neurological complication: Secondary | ICD-10-CM

## 2022-07-31 DIAGNOSIS — M25572 Pain in left ankle and joints of left foot: Secondary | ICD-10-CM

## 2022-07-31 DIAGNOSIS — I420 Dilated cardiomyopathy: Secondary | ICD-10-CM

## 2022-07-31 DIAGNOSIS — I700 Atherosclerosis of aorta: Secondary | ICD-10-CM | POA: Diagnosis not present

## 2022-07-31 LAB — MICROALBUMIN / CREATININE URINE RATIO
Creatinine,U: 56.1 mg/dL
Microalb Creat Ratio: 5.3 mg/g (ref 0.0–30.0)
Microalb, Ur: 3 mg/dL — ABNORMAL HIGH (ref 0.0–1.9)

## 2022-07-31 LAB — COMPREHENSIVE METABOLIC PANEL
ALT: 15 U/L (ref 0–53)
AST: 23 U/L (ref 0–37)
Albumin: 4.1 g/dL (ref 3.5–5.2)
Alkaline Phosphatase: 78 U/L (ref 39–117)
BUN: 79 mg/dL — ABNORMAL HIGH (ref 6–23)
CO2: 29 mEq/L (ref 19–32)
Calcium: 9.2 mg/dL (ref 8.4–10.5)
Chloride: 96 mEq/L (ref 96–112)
Creatinine, Ser: 5.99 mg/dL (ref 0.40–1.50)
GFR: 7.98 mL/min — CL (ref >60.00)
Glucose, Bld: 80 mg/dL (ref 70–99)
Potassium: 4.3 mEq/L (ref 3.5–5.1)
Sodium: 138 mEq/L (ref 135–145)
Total Bilirubin: 0.7 mg/dL (ref 0.2–1.2)
Total Protein: 7.7 g/dL (ref 6.0–8.3)

## 2022-07-31 LAB — HEMOGLOBIN A1C: Hgb A1c MFr Bld: 7.1 % — ABNORMAL HIGH (ref 4.6–6.5)

## 2022-07-31 LAB — LIPID PANEL
Cholesterol: 101 mg/dL (ref 0–200)
HDL: 40.5 mg/dL (ref >39.00)
LDL Cholesterol: 43 mg/dL (ref 0–99)
NonHDL: 60.7
Total CHOL/HDL Ratio: 2
Triglycerides: 89 mg/dL (ref 0.0–149.0)
VLDL: 17.8 mg/dL (ref 0.0–40.0)

## 2022-07-31 MED ORDER — AMMONIUM LACTATE 12 % EX CREA: 1.0000 | TOPICAL_CREAM | CUTANEOUS | 1 refills | Status: DC | PRN

## 2022-07-31 NOTE — Patient Instructions (Signed)
Visit Information  Thank you for taking time to visit with me today. Please don't hesitate to contact me if I can be of assistance to you.   Following are the goals we discussed today:   Goals Addressed             This Visit's Progress    Caregiver support for Facility Placement       Activities and task to complete in order to accomplish goals.  Daughter will assist with activities Call or go to Department of Social Services to apply for long-term care Medicaid  Review brochure on Medicaid tips  Review facilities from the list provided call and go visit Review booklet ''When a loved one need long-term care" ( this was e-mailed per dau. request)  Social work will continue to collaborate with PCP for [facility] and other needed items for placement  You have indicated you would like a rehab center located in [place] next appointment is by telephone on 08/04/22   Please call the care guide team at [phone] if you need to cancel or reschedule your appointment.   The patient verbalized understanding of instructions, educational materials, and care plan provided today and DECLINED offer to receive copy of patient instructions, educational materials, and care plan.   Casimer Lanius, [place] [phone]

## 2022-07-31 NOTE — Patient Outreach (Signed)
  Care Coordination  In Person Provider Office Visit Note   07/31/2022 Name: Eric Herring MRN: [id number] DOB: 1934-10-28  Eric Herring is a 88 y.o. year old male who sees [name], [name], DO for primary care. I spoke with  [name] by phone today.  What matters to the patients health and wellness today?  Being able to stand up. Patient was accompanied by his two son's during the collaboration office visit, they provided information during this encounter. Patient reports not being able to get up from his chair, he is in agreement to go to rehab    Goals Addressed             This Visit's Progress    Caregiver support for Facility Placement       Activities and task to complete in order to accomplish goals.  Daughter will assist with activities Call or go to Department of Social Services to apply for long-term care Medicaid  Review brochure on Medicaid tips  Review facilities from the list provided call and go visit Review booklet ''When a loved one need long-term care" ( this was e-mailed per dau. request)  Social work will continue to collaborate with PCP for [facility] and other needed items for placement  You have indicated you would like a rehab center located in [place] and interventions completed:  No  Care Coordination Interventions:  Yes, provided  Interventions Today    Flowsheet Row Most Recent Value  Chronic Disease   Chronic disease during today's visit Congestive Heart Failure (CHF), Chronic Obstructive Pulmonary Disease (COPD), Diabetes, Hypertension (HTN)  General Interventions   General Interventions Discussed/Reviewed General Interventions Reviewed, Level of Care  [name] Care Coordination program with patient and son's provided copy of [facility] brochure]  Communication with PCP/Specialists  [CMA]  Level of [place]  [rehab / at skilled nursing and PT]  Education Interventions    Education Provided Provided Education  Provided Verbal Education On Other  [placement process for Rehab]       Follow up plan: Follow up call scheduled for 08/04/22 with patient's daughter [name] will continue to collaborate with PCP and assist with progression of Home Health PT for Rehab.  Encounter Outcome:  Pt. Visit Completed   Casimer Lanius, [place] [phone]

## 2022-07-31 NOTE — Patient Instructions (Signed)
If you do not hear anything about your referral in the next week, call our office and ask for an update.  Give [place] 2-3 business days to get the results of your labs back.   For the swelling in your lower extremities, be sure to elevate your legs when able, mind the salt intake, stay physically active and consider wearing compression stockings.  Once the spot on the toe unroofs the skin, it will be a wound. Triple antibiotic ointment twice daily for 2 weeks. If no noticeable improvement by then, please let me know.   Let us know if you need anything.

## 2022-07-31 NOTE — Telephone Encounter (Signed)
Called and spoke to his daughter she stated once they changed his fluid pill things have changed. She also stated there is no one to take him to the doctor until next Monday. No other family members are available.

## 2022-07-31 NOTE — Telephone Encounter (Signed)
Pt reported today he continues to follow w nephro. Let's recheck BMP tomorrow. Plz sched and order. Ty.

## 2022-07-31 NOTE — Telephone Encounter (Signed)
Called the daughter [name] informed of PCP request. She verbalized agreement and said she would take care of getting scheduled on the day her brother is off from work and can take him.

## 2022-07-31 NOTE — Telephone Encounter (Signed)
Called the daughter informed our lab the latest tomorrow is 3:30 And could go to the [employer] in [place], but her brother does not get off until 5. They is no transporation for him

## 2022-07-31 NOTE — Addendum Note (Signed)
Addended by: Manuela Schwartz on: 07/31/2022 12:47 PM   Modules accepted: Orders

## 2022-07-31 NOTE — Telephone Encounter (Signed)
CRITICAL VALUE STICKER  CRITICAL VALUE: 5.99 Creatinine, 7.98 GFR  RECEIVER (on-site recipient of call): Janett Billow CMA  DATE & TIME NOTIFIED: 07/31/2022 2:35pm  MESSENGER (representative from lab): [place]   MD NOTIFIED: [name]   TIME OF NOTIFICATION: 2:40PM  RESPONSE:

## 2022-07-31 NOTE — Telephone Encounter (Signed)
Called the daughter left message to call back 

## 2022-07-31 NOTE — Telephone Encounter (Signed)
Then I would like them to sched an appt w nephro for next week.

## 2022-07-31 NOTE — Progress Notes (Signed)
Subjective:   Chief Complaint  Patient presents with   Follow-up    PT/OT referral requested    Eric Herring is a 88 y.o. male here for follow-up of diabetes.  Here with 2 of his sons. Eric Herring does not routinely monitor his sugars.  Patient does not require insulin.   Medications include: diet controlled Diet is healthy overall.  Exercise: none No Cp or new SOB.  Hypothyroidism Patient presents for follow-up of hypothyroidism.  Reports compliance with medication. Current symptoms include: swelling Denies: denies fatigue, weight changes, heat/cold intolerance, bowel/skin changes or CVS symptoms He believes his dose should be not significantly changed  Aortic atherosclerosis Patient presents for aortic atherosclerosis follow up. Currently being treated with pravastatin 20 mg daily and compliance with treatment thus far has been good. He denies myalgias. Diet/exercise as above The patient is known to have coexisting coronary artery disease.  Over the past 2 weeks, the patient has had a blister on his left great toe.  There is no pain but has been bleeding.  He does follow with the podiatry team.  Unknown how this happened.  He has not been using anything at home.  Patient has been having more swelling in his lower extremities in addition to weakness.  He has trouble moving around.  He also has left ankle pain starting several weeks ago.  No injury or change in activity.  Past Medical History:  Diagnosis Date   Acute on chronic renal insufficiency 12/11/2017   Allergy    Aortic atherosclerosis (HCC)    Arthropathy    CAD (coronary artery disease)    Cardiomyopathy ([place])    Chronic kidney disease    Chronic renal failure [id number]   Chronic systolic congestive heart failure ([place]) 01/23/2019   Colon polyp    COPD (chronic obstructive pulmonary disease) (HCC)    Diabetes (HCC)    Diabetic neuropathy (HCC)    Dilated cardiomyopathy ([place]) 11/20/2017   Heart murmur     Hypercholesterolemia    Hypertension    pulmonary   LVH (left ventricular hypertrophy)    Lymphoma (HCC)    Metabolic bone disease [id number]   Mitral regurgitation    Prostate cancer (HCC)    Pulmonary hypertension (HCC)    Tricuspid regurgitation    Type 2 diabetes mellitus with neurological complications ([place]) 123456   Vitamin D deficiency 12/12/2019     Related testing: Retinal exam: Done Pneumovax: done  Objective:  BP 122/78 (BP Location: Left Arm, Patient Position: Sitting, Cuff Size: Normal)   Pulse 80   Temp 98 F (36.7 C) (Oral)   Ht 5\' 8"  (1.727 m)   Wt 138 lb 4 oz (62.7 kg)   BMI 21.02 kg/m  General:  Well developed, well nourished, in no apparent distress Skin:  blood blister on L great toe with active bleeding/oozing noted laterally on the plantar surface; no TTP, excessive warmth, or erythema Lungs:  CTAB, no access msc use Cardio:  RRR, no bruits, 3+ pitting b/l LE edema tapering at knees. MSK: No calf ttp b/l Psych: Normal affect and mood, question reliability as a historian.  Assessment:   Type 2 diabetes mellitus with neurological complications (Eric Herring) - Plan: Comprehensive metabolic panel, Lipid panel, Hemoglobin A1c, Microalbumin / creatinine urine ratio  Hypothyroidism, unspecified type - Plan: TSH  Aortic atherosclerosis (Eric Herring)  Dilated cardiomyopathy (Eric Herring) - Plan: Ambulatory referral to Home Health  Acute left ankle pain - Plan: Ambulatory referral to Home Health  Scaling of skin -  Plan: ammonium lactate (LAC-HYDRIN) 12 % cream   Plan:   Chronic, stable.  Continue diet control.  Counseled on diet and exercise. Chronic, stable.  Continue levothyroxine 100 mg daily. Chronic, stable.  Continue pravastatin 20 mg daily. Refer to home health physical therapy. As above. F/u in 6 mo. The patient and his sons voiced understanding and agreement to the plan.  [name], DO 07/31/22 11:58 AM

## 2022-08-01 ENCOUNTER — Other Ambulatory Visit (INDEPENDENT_AMBULATORY_CARE_PROVIDER_SITE_OTHER): Payer: Medicare HMO

## 2022-08-01 ENCOUNTER — Other Ambulatory Visit: Payer: Self-pay | Admitting: Family Medicine

## 2022-08-01 DIAGNOSIS — I420 Dilated cardiomyopathy: Secondary | ICD-10-CM | POA: Diagnosis not present

## 2022-08-01 DIAGNOSIS — E1149 Type 2 diabetes mellitus with other diabetic neurological complication: Secondary | ICD-10-CM | POA: Diagnosis not present

## 2022-08-01 DIAGNOSIS — E039 Hypothyroidism, unspecified: Secondary | ICD-10-CM

## 2022-08-01 DIAGNOSIS — I5022 Chronic systolic (congestive) heart failure: Secondary | ICD-10-CM | POA: Diagnosis not present

## 2022-08-01 DIAGNOSIS — I272 Pulmonary hypertension, unspecified: Secondary | ICD-10-CM | POA: Diagnosis not present

## 2022-08-01 DIAGNOSIS — E1122 Type 2 diabetes mellitus with diabetic chronic kidney disease: Secondary | ICD-10-CM | POA: Diagnosis not present

## 2022-08-01 DIAGNOSIS — I130 Hypertensive heart and chronic kidney disease with heart failure and stage 1 through stage 4 chronic kidney disease, or unspecified chronic kidney disease: Secondary | ICD-10-CM | POA: Diagnosis not present

## 2022-08-01 DIAGNOSIS — N189 Chronic kidney disease, unspecified: Secondary | ICD-10-CM | POA: Diagnosis not present

## 2022-08-01 DIAGNOSIS — I700 Atherosclerosis of aorta: Secondary | ICD-10-CM | POA: Diagnosis not present

## 2022-08-01 DIAGNOSIS — J449 Chronic obstructive pulmonary disease, unspecified: Secondary | ICD-10-CM | POA: Diagnosis not present

## 2022-08-01 DIAGNOSIS — E1140 Type 2 diabetes mellitus with diabetic neuropathy, unspecified: Secondary | ICD-10-CM | POA: Diagnosis not present

## 2022-08-01 LAB — T4, FREE: Free T4: 0.92 ng/dL (ref 0.60–1.60)

## 2022-08-01 LAB — TSH: TSH: 45.02 u[IU]/mL — ABNORMAL HIGH (ref 0.35–5.50)

## 2022-08-03 ENCOUNTER — Telehealth: Payer: Self-pay | Admitting: Family Medicine

## 2022-08-03 DIAGNOSIS — I700 Atherosclerosis of aorta: Secondary | ICD-10-CM | POA: Diagnosis not present

## 2022-08-03 DIAGNOSIS — I130 Hypertensive heart and chronic kidney disease with heart failure and stage 1 through stage 4 chronic kidney disease, or unspecified chronic kidney disease: Secondary | ICD-10-CM | POA: Diagnosis not present

## 2022-08-03 DIAGNOSIS — E1140 Type 2 diabetes mellitus with diabetic neuropathy, unspecified: Secondary | ICD-10-CM | POA: Diagnosis not present

## 2022-08-03 DIAGNOSIS — I272 Pulmonary hypertension, unspecified: Secondary | ICD-10-CM | POA: Diagnosis not present

## 2022-08-03 DIAGNOSIS — I420 Dilated cardiomyopathy: Secondary | ICD-10-CM | POA: Diagnosis not present

## 2022-08-03 DIAGNOSIS — N189 Chronic kidney disease, unspecified: Secondary | ICD-10-CM | POA: Diagnosis not present

## 2022-08-03 DIAGNOSIS — I5022 Chronic systolic (congestive) heart failure: Secondary | ICD-10-CM | POA: Diagnosis not present

## 2022-08-03 DIAGNOSIS — E1122 Type 2 diabetes mellitus with diabetic chronic kidney disease: Secondary | ICD-10-CM | POA: Diagnosis not present

## 2022-08-03 DIAGNOSIS — J449 Chronic obstructive pulmonary disease, unspecified: Secondary | ICD-10-CM | POA: Diagnosis not present

## 2022-08-03 NOTE — Telephone Encounter (Signed)
VO given.

## 2022-08-03 NOTE — Telephone Encounter (Signed)
Caller/Agency: [place], [place] Number: [phone], safe to leave vm Requesting OT/PT/Skilled Nursing/Social Work/Speech Therapy: OT eval Frequency: evaluation

## 2022-08-04 ENCOUNTER — Encounter: Payer: Self-pay | Admitting: Licensed Clinical Social Worker

## 2022-08-07 ENCOUNTER — Ambulatory Visit: Payer: Self-pay | Admitting: Licensed Clinical Social Worker

## 2022-08-07 DIAGNOSIS — E1140 Type 2 diabetes mellitus with diabetic neuropathy, unspecified: Secondary | ICD-10-CM | POA: Diagnosis not present

## 2022-08-07 DIAGNOSIS — I130 Hypertensive heart and chronic kidney disease with heart failure and stage 1 through stage 4 chronic kidney disease, or unspecified chronic kidney disease: Secondary | ICD-10-CM | POA: Diagnosis not present

## 2022-08-07 DIAGNOSIS — I420 Dilated cardiomyopathy: Secondary | ICD-10-CM | POA: Diagnosis not present

## 2022-08-07 DIAGNOSIS — E1122 Type 2 diabetes mellitus with diabetic chronic kidney disease: Secondary | ICD-10-CM | POA: Diagnosis not present

## 2022-08-07 DIAGNOSIS — I700 Atherosclerosis of aorta: Secondary | ICD-10-CM | POA: Diagnosis not present

## 2022-08-07 DIAGNOSIS — I272 Pulmonary hypertension, unspecified: Secondary | ICD-10-CM | POA: Diagnosis not present

## 2022-08-07 DIAGNOSIS — N189 Chronic kidney disease, unspecified: Secondary | ICD-10-CM | POA: Diagnosis not present

## 2022-08-07 DIAGNOSIS — J449 Chronic obstructive pulmonary disease, unspecified: Secondary | ICD-10-CM | POA: Diagnosis not present

## 2022-08-07 DIAGNOSIS — I5022 Chronic systolic (congestive) heart failure: Secondary | ICD-10-CM | POA: Diagnosis not present

## 2022-08-07 NOTE — Patient Outreach (Signed)
[place] LEVEL OF CARE FORM     IDENTIFICATION  Patient Name: Eric Herring Birthdate: Dec 15, 1934 Sex: male Admission Date (Current Location): Home [address]  [place] [address]   County and [place] Number:  Herbalist and Address:  [facility], [place] [address], [place], [place] [address]      Provider Number: [id number]  Attending Physician Name and Address:  Riki Sheer, [facility] [address]  Relative Name and Phone Number:  Aleda Grana   [phone]    Current Level of Care: Home Recommended Level of Care: [place] Prior Approval Number:    Date Approved/Denied:   PASRR Number:    Discharge Plan:      Current Diagnoses: Patient Active Problem List   Diagnosis Date Noted   Hypothyroidism 03/21/2021   Tricuspid regurgitation 05/14/2020   Pulmonary hypertension 05/14/2020   Mitral regurgitation 05/14/2020   Lymphoma 05/14/2020   LVH (left ventricular hypertrophy) 05/14/2020   Hypertension 05/14/2020   Hypercholesterolemia 05/14/2020   Heart murmur 05/14/2020   Diabetic neuropathy 05/14/2020   Colon polyp 05/14/2020   Arthropathy 05/14/2020   Aortic atherosclerosis 05/14/2020   Allergy 05/14/2020   Prostate cancer    Diabetes    COPD (chronic obstructive pulmonary disease)    Chronic kidney disease    Cardiomyopathy    CAD (coronary artery disease)    Vitamin D deficiency 12/12/2019   Volume overload 12/12/2019   Chronic renal failure [id number]   Chronic systolic congestive heart failure [id number]   Metabolic bone disease [id number]   Acute on chronic renal insufficiency 12/11/2017   Dilated cardiomyopathy 11/20/2017   Type 2 diabetes mellitus with neurological complications 123456   Edema 10/08/2014    Orientation RESPIRATION BLADDER Height & Weight     Self, Time, Situation, Place  Normal Continent Weight:    Height:     BEHAVIORAL SYMPTOMS/MOOD NEUROLOGICAL BOWEL NUTRITION STATUS      Continent Diet (heart heathy)  AMBULATORY STATUS COMMUNICATION OF NEEDS Skin   Extensive Assist Verbally Normal                       Personal Care Assistance Level of Assistance  Bathing, Feeding, Dressing Bathing Assistance: Maximum assistance Feeding assistance: Independent Dressing Assistance: Limited assistance     Functional Limitations Info  Sight, Hearing, Speech Sight Info: Adequate Hearing Info: Adequate Speech Info: Adequate    SPECIAL CARE FACTORS FREQUENCY  PT (By licensed PT), OT (By licensed OT)     PT Frequency: 5 times weekly              Contractures Contractures Info: Not present    Additional Factors Info  Allergies   Allergies Info: LisinoprilAnaphylaxis, Swelling           Current Medications (08/07/2022):  This is the current medication list Current Outpatient Medications  Medication Sig Dispense Refill   albuterol (PROVENTIL) (2.5 MG/3ML) 0.083% nebulizer solution USE 1 VIAL IN NEBULIZER EVERY 6 HOURS AS NEEDED FOR WHEEZING FOR SHORTNESS OF BREATH (Patient taking differently: Take 2.5 mg by nebulization every 4 (four) hours as needed for wheezing or shortness of breath. USE 1 VIAL IN NEBULIZER EVERY 6 HOURS AS NEEDED FOR WHEEZING FOR SHORTNESS OF BREATH) 75 mL 5   albuterol (VENTOLIN HFA) 108 (90 Base) MCG/ACT inhaler INHALE 2 PUFFS BY MOUTH EVERY 6 HOURS AS  NEEDED FOR WHEEZING FOR SHORTNESS OF BREATH 18 g 1   allopurinol (ZYLOPRIM) 100 MG tablet Take 1 tablet (100 mg total) by mouth daily. 90 tablet 1   amiodarone (PACERONE) 200 MG tablet Take 1 tablet (200 mg total) by mouth daily. 90 tablet 1   ammonium lactate (LAC-HYDRIN) 12 % cream Apply 1 Application topically as needed for dry skin. 385 g 1   aspirin 81 MG tablet Take 81 mg by mouth daily.     Calcium Carb-Cholecalciferol (CALCIUM-VITAMIN D) 500-200 MG-UNIT tablet Take 1 tablet by mouth daily.       fexofenadine (ALLEGRA ALLERGY) 180 MG tablet Take 1 tablet (180 mg total) by mouth daily. 30 tablet 5   Fluticasone-Umeclidin-Vilant (TRELEGY ELLIPTA) 100-62.5-25 MCG/INH AEPB Inhale 1 puff into the lungs daily. 30 each 5   isosorbide mononitrate (IMDUR) 30 MG 24 hr tablet TAKE 1 TABLET EVERY DAY (Patient taking differently: Take 30 mg by mouth daily. Unknown strength) 90 tablet 1   levothyroxine (SYNTHROID) 100 MCG tablet Take 1 tablet (100 mcg total) by mouth daily. 90 tablet 1   Multiple Vitamin (MULTIVITAMIN) tablet Take 1 tablet by mouth.      NON FORMULARY Chuck pads     pravastatin (PRAVACHOL) 20 MG tablet Take 1 tablet (20 mg total) by mouth daily. 90 tablet 1   torsemide (DEMADEX) 20 MG tablet Take 3 tablets (60 mg total) by mouth 2 (two) times daily. 180 tablet 1   No current facility-administered medications for this visit.     Discharge Medications:   Relevant Imaging Results:  Relevant Lab Results:   Additional [place], LCSW

## 2022-08-08 ENCOUNTER — Ambulatory Visit: Payer: Self-pay | Admitting: Licensed Clinical Social Worker

## 2022-08-08 NOTE — Patient Outreach (Signed)
  Care Coordination  Follow Up Visit Note   08/08/2022 Name: Eric Herring MRN: [id number] DOB: 03/28/1935  Eric Herring is a 88 y.o. year old male who sees Eric Herring, Eric Oyster, DO for primary care. I spoke with  Eric Herring's daughter Eric Herring by phone today.  What matters to the patients health and wellness today?  Getting patient to short term rehab.  Dau met with DSS to discuss Medicaid for long-term care.  This is not going to be an option for patient based on information they shared.  Will continue with original plan to get patient to rehab to build up his strength. LCSW faxed FL2, provider notes, and collaborated with PT.    Goals Addressed             This Visit's Progress    Caregiver support for Facility Placement       Activities and task to complete in order to accomplish goals.  Daughter will assist with activities The FL2 is complete and has been faxed to Eric Herring per your request   Social work will continue to collaborate with PCP , Eric Herring  [phone]) as well at Eric Herring  [phone]) for PT evaluation.  Per [facility] they will fax the evaluation to [place]       SDOH assessments and interventions completed:  No   Care Coordination Interventions:  Yes, provided  Interventions Today    Flowsheet Row Most Recent Value  Chronic Disease   Chronic disease during today's visit Congestive Heart Failure (CHF), Hypertension (HTN), Diabetes, Chronic Kidney Disease/End Stage Renal Disease (ESRD)  General Interventions   General Interventions Discussed/Reviewed Level of Care, Communication with, General Interventions Reviewed  Communication with PCP/Specialists  [facility] PT to complete and fax assessment to SNF, and [facility] and available bed)]  Level of [place], Production assistant, radio person at [place] home is Eric Herring [id number]  Applications FL-2  [completed, signed by PCP and faxed to [employer]  Education Interventions   Applications FL-2  [completed, signed by PCP and faxed to [place] Discussed/Reviewed Coping Strategies  [emotional support and solution focused provided]       Follow up plan: Follow up call scheduled for 2 weeks with daughter.  LCSW will f/u on PT update and facility in 3 days.    Encounter Outcome:  Pt. Visit Completed   Eric Herring, Eric Herring [phone]

## 2022-08-08 NOTE — Patient Instructions (Signed)
Visit Information  Thank you for taking time to visit with me today. Please don't hesitate to contact me if I can be of assistance to you.   Following are the goals we discussed today:   Goals Addressed             This Visit's Progress    Caregiver support for Facility Placement       Activities and task to complete in order to accomplish goals.  Daughter will assist with activities The FL2 is complete and has been faxed to [facility] per your request   Social work will continue to collaborate with PCP , [place] Loree Fee Clovis Riley  [phone]) as well at [facility]  [phone]) for PT evaluation.  Per [facility] they will fax the evaluation to [place] next appointment is by telephone on 08/16/22 at 11:30  Please call the care guide team at [phone] if you need to cancel or reschedule your appointment.   If you are experiencing a Mental Health or [place] or need someone to talk to, please call the Suicide and Crisis Lifeline: 988 call the [place] National Suicide Prevention Lifeline: (214)359-9403 or TTY: 831-670-7862 TTY (386)327-0930) to talk to a trained counselor call 1-800-273-TALK (toll free, 24 hour hotline) go to [facility] [address], [place] [phone])   Patient verbalizes understanding of instructions and care plan provided today and agrees to view in [place]. Active MyChart status and patient understanding of how to access instructions and care plan via MyChart confirmed with patient.     Casimer Lanius, [place] [phone]

## 2022-08-09 ENCOUNTER — Telehealth: Payer: Self-pay | Admitting: Family Medicine

## 2022-08-09 DIAGNOSIS — I272 Pulmonary hypertension, unspecified: Secondary | ICD-10-CM | POA: Diagnosis not present

## 2022-08-09 DIAGNOSIS — I700 Atherosclerosis of aorta: Secondary | ICD-10-CM | POA: Diagnosis not present

## 2022-08-09 DIAGNOSIS — E1140 Type 2 diabetes mellitus with diabetic neuropathy, unspecified: Secondary | ICD-10-CM | POA: Diagnosis not present

## 2022-08-09 DIAGNOSIS — I5022 Chronic systolic (congestive) heart failure: Secondary | ICD-10-CM | POA: Diagnosis not present

## 2022-08-09 DIAGNOSIS — I420 Dilated cardiomyopathy: Secondary | ICD-10-CM | POA: Diagnosis not present

## 2022-08-09 DIAGNOSIS — E1122 Type 2 diabetes mellitus with diabetic chronic kidney disease: Secondary | ICD-10-CM | POA: Diagnosis not present

## 2022-08-09 DIAGNOSIS — I130 Hypertensive heart and chronic kidney disease with heart failure and stage 1 through stage 4 chronic kidney disease, or unspecified chronic kidney disease: Secondary | ICD-10-CM | POA: Diagnosis not present

## 2022-08-09 DIAGNOSIS — J449 Chronic obstructive pulmonary disease, unspecified: Secondary | ICD-10-CM | POA: Diagnosis not present

## 2022-08-09 DIAGNOSIS — N189 Chronic kidney disease, unspecified: Secondary | ICD-10-CM | POA: Diagnosis not present

## 2022-08-09 NOTE — Telephone Encounter (Signed)
Called HH informed of verbal ok per PCP. 

## 2022-08-09 NOTE — Telephone Encounter (Signed)
Caller/Agency: [facility]) Callback Number: [phone] (secure) Requesting OT/PT/Skilled Nursing/Social Work/Speech Therapy: speech therapy Frequency: 1x for 4 weeks.

## 2022-08-11 ENCOUNTER — Telehealth: Payer: Self-pay | Admitting: Family Medicine

## 2022-08-11 DIAGNOSIS — E1140 Type 2 diabetes mellitus with diabetic neuropathy, unspecified: Secondary | ICD-10-CM | POA: Diagnosis not present

## 2022-08-11 DIAGNOSIS — J449 Chronic obstructive pulmonary disease, unspecified: Secondary | ICD-10-CM | POA: Diagnosis not present

## 2022-08-11 DIAGNOSIS — I130 Hypertensive heart and chronic kidney disease with heart failure and stage 1 through stage 4 chronic kidney disease, or unspecified chronic kidney disease: Secondary | ICD-10-CM | POA: Diagnosis not present

## 2022-08-11 DIAGNOSIS — N189 Chronic kidney disease, unspecified: Secondary | ICD-10-CM | POA: Diagnosis not present

## 2022-08-11 DIAGNOSIS — I700 Atherosclerosis of aorta: Secondary | ICD-10-CM | POA: Diagnosis not present

## 2022-08-11 DIAGNOSIS — E1122 Type 2 diabetes mellitus with diabetic chronic kidney disease: Secondary | ICD-10-CM | POA: Diagnosis not present

## 2022-08-11 DIAGNOSIS — I420 Dilated cardiomyopathy: Secondary | ICD-10-CM | POA: Diagnosis not present

## 2022-08-11 DIAGNOSIS — I5022 Chronic systolic (congestive) heart failure: Secondary | ICD-10-CM | POA: Diagnosis not present

## 2022-08-11 DIAGNOSIS — I272 Pulmonary hypertension, unspecified: Secondary | ICD-10-CM | POA: Diagnosis not present

## 2022-08-11 NOTE — Telephone Encounter (Signed)
Called HH informed of verbal ok per PCP. 

## 2022-08-11 NOTE — Telephone Encounter (Signed)
Caller/Agency: [facility]die Braun [facility]) Callback Number: [phone]  Requesting OT/PT/Skilled Nursing/Social Work/Speech Therapy: OT Frequency: 1 w 5

## 2022-08-14 ENCOUNTER — Other Ambulatory Visit (HOSPITAL_COMMUNITY): Payer: Medicare HMO

## 2022-08-16 ENCOUNTER — Ambulatory Visit: Payer: Self-pay | Admitting: Licensed Clinical Social Worker

## 2022-08-16 DIAGNOSIS — I5022 Chronic systolic (congestive) heart failure: Secondary | ICD-10-CM | POA: Diagnosis not present

## 2022-08-16 DIAGNOSIS — E1140 Type 2 diabetes mellitus with diabetic neuropathy, unspecified: Secondary | ICD-10-CM | POA: Diagnosis not present

## 2022-08-16 DIAGNOSIS — J449 Chronic obstructive pulmonary disease, unspecified: Secondary | ICD-10-CM | POA: Diagnosis not present

## 2022-08-16 DIAGNOSIS — E1122 Type 2 diabetes mellitus with diabetic chronic kidney disease: Secondary | ICD-10-CM | POA: Diagnosis not present

## 2022-08-16 DIAGNOSIS — I272 Pulmonary hypertension, unspecified: Secondary | ICD-10-CM | POA: Diagnosis not present

## 2022-08-16 DIAGNOSIS — N189 Chronic kidney disease, unspecified: Secondary | ICD-10-CM | POA: Diagnosis not present

## 2022-08-16 DIAGNOSIS — I420 Dilated cardiomyopathy: Secondary | ICD-10-CM | POA: Diagnosis not present

## 2022-08-16 DIAGNOSIS — I700 Atherosclerosis of aorta: Secondary | ICD-10-CM | POA: Diagnosis not present

## 2022-08-16 DIAGNOSIS — I130 Hypertensive heart and chronic kidney disease with heart failure and stage 1 through stage 4 chronic kidney disease, or unspecified chronic kidney disease: Secondary | ICD-10-CM | POA: Diagnosis not present

## 2022-08-16 NOTE — Patient Instructions (Signed)
Visit Information  Thank you for taking time to visit with me today. Please don't hesitate to contact me if I can be of assistance to you.   Following are the goals we discussed today:   Goals Addressed             This Visit's Progress    Caregiver support for Facility Placement       Activities and task to complete in order to accomplish goals.  Daughter will assist with activities FL2 complete and has been faxed to [facility] as well as the PT evaluation from Home Health PT    Social work will continue to collaborate with PCP , [facility] [phone] [name]  [phone]) as well at [facility]. Please review the private pay aide information e-mailed to you for long-term care home support options        Our next appointment is by telephone on 08/30/22 at 2:30  Please call the care guide team at [phone] if you need to cancel or reschedule your appointment.    Patient verbalizes understanding of instructions and care plan provided today and agrees to view in MyChart. Active MyChart status and patient understanding of how to access instructions and care plan via MyChart confirmed with patient.     Sammuel Hines, LCSW Social Work Care Coordination  [facility] [name] [phone]

## 2022-08-16 NOTE — Patient Outreach (Signed)
  Care Coordination  Follow Up Visit Note   08/16/2022 Name: Eric Herring MRN: [id number] DOB: 02/23/1935  Eric Herring is a 88 y.o. year old male who sees Eric Herring, Eric Roche, DO for primary care. I spoke with  Eric Herring 's daughter by phone today.  What matters to the patients health and wellness today?  Getting patient placed at facility for short term rehab.  Per patient's daughter she has spoken with Social Worker Eric Herring [phone] at [facility], Bed offer has been extended.  Facility is currently waiting on approval from patient's insurance before he is able to go to facility.   Goals Addressed             This Visit's Progress    Caregiver support for Facility Placement       Activities and task to complete in order to accomplish goals.  Daughter will assist with activities FL2 complete and has been faxed to [facility] as well as the PT evaluation from Home Health PT    Social work will continue to collaborate with PCP , [facility] [phone] Eric Herring  [phone]) as well at [facility]. Please review the private pay aide information e-mailed to you for long-term care home support options       SDOH assessments and interventions completed:  No   Care Coordination Interventions:  Yes, provided  Interventions Today    Flowsheet Row Most Recent Value  Chronic Disease   Chronic disease during today's visit Congestive Heart Failure (CHF), Chronic Obstructive Pulmonary Disease (COPD), Diabetes, Hypertension (HTN)  General Interventions   General Interventions Discussed/Reviewed Level of Care  Level of Care Skilled Nursing Facility, Personal Care Services  [discussed LTC options after patient leaves short term care rehab]       Follow up plan: Follow up call scheduled for 2 weeks    Encounter Outcome:  Pt. Visit Completed   Eric Hines, LCSW Social Work Care Coordination  [facility] Eric Herring [employer] [phone]

## 2022-08-17 DIAGNOSIS — E1122 Type 2 diabetes mellitus with diabetic chronic kidney disease: Secondary | ICD-10-CM | POA: Diagnosis not present

## 2022-08-17 DIAGNOSIS — N189 Chronic kidney disease, unspecified: Secondary | ICD-10-CM | POA: Diagnosis not present

## 2022-08-17 DIAGNOSIS — C8331 Diffuse large B-cell lymphoma, lymph nodes of head, face, and neck: Secondary | ICD-10-CM | POA: Diagnosis not present

## 2022-08-17 DIAGNOSIS — E1140 Type 2 diabetes mellitus with diabetic neuropathy, unspecified: Secondary | ICD-10-CM | POA: Diagnosis not present

## 2022-08-17 DIAGNOSIS — I130 Hypertensive heart and chronic kidney disease with heart failure and stage 1 through stage 4 chronic kidney disease, or unspecified chronic kidney disease: Secondary | ICD-10-CM | POA: Diagnosis not present

## 2022-08-17 DIAGNOSIS — R531 Weakness: Secondary | ICD-10-CM | POA: Diagnosis not present

## 2022-08-17 DIAGNOSIS — J449 Chronic obstructive pulmonary disease, unspecified: Secondary | ICD-10-CM | POA: Diagnosis not present

## 2022-08-17 DIAGNOSIS — J479 Bronchiectasis, uncomplicated: Secondary | ICD-10-CM | POA: Diagnosis not present

## 2022-08-17 DIAGNOSIS — I420 Dilated cardiomyopathy: Secondary | ICD-10-CM | POA: Diagnosis not present

## 2022-08-17 DIAGNOSIS — C61 Malignant neoplasm of prostate: Secondary | ICD-10-CM | POA: Diagnosis not present

## 2022-08-17 DIAGNOSIS — I5022 Chronic systolic (congestive) heart failure: Secondary | ICD-10-CM | POA: Diagnosis not present

## 2022-08-17 DIAGNOSIS — I2510 Atherosclerotic heart disease of native coronary artery without angina pectoris: Secondary | ICD-10-CM | POA: Diagnosis not present

## 2022-08-17 DIAGNOSIS — R918 Other nonspecific abnormal finding of lung field: Secondary | ICD-10-CM | POA: Diagnosis not present

## 2022-08-17 DIAGNOSIS — I429 Cardiomyopathy, unspecified: Secondary | ICD-10-CM | POA: Diagnosis not present

## 2022-08-17 DIAGNOSIS — I517 Cardiomegaly: Secondary | ICD-10-CM | POA: Diagnosis not present

## 2022-08-17 DIAGNOSIS — J9811 Atelectasis: Secondary | ICD-10-CM | POA: Diagnosis not present

## 2022-08-17 DIAGNOSIS — N184 Chronic kidney disease, stage 4 (severe): Secondary | ICD-10-CM | POA: Diagnosis not present

## 2022-08-17 DIAGNOSIS — Z8546 Personal history of malignant neoplasm of prostate: Secondary | ICD-10-CM | POA: Diagnosis not present

## 2022-08-18 ENCOUNTER — Encounter: Payer: Self-pay | Admitting: Licensed Clinical Social Worker

## 2022-08-18 NOTE — Patient Outreach (Signed)
  Care Coordination  Collaboration  Visit Note   08/18/2022 Name: Eric Herring MRN: [id number] DOB: [birth year]  Eric Herring is a 88 y.o. year old male who sees Eric Herring, Eric Roche, DO for primary care. I  did not engage patient or family during this encounter  What matters to the patients health and wellness today?    Patient was not interviewed or contacted during this encounter LCSW  collaborated with Rehab facility and PCP  to assist with meeting patient's needs.  .    Goals Addressed             This Visit's Progress    Caregiver support for Facility Placement       Activities and task to complete in order to accomplish goals.  Daughter will assist with activities FL2 complete and has been faxed to [facility] as well as the PT evaluation from Home Health PT    Social work will continue to collaborate with PCP , [facility] [phone] Eric Herring  [phone]) as well at [facility]. Please review the private pay aide information e-mailed to you for long-term care home support options        SDOH assessments and interventions completed:  No   Care Coordination Interventions:  Yes, provided  Interventions Today    Flowsheet Row Most Recent Value  Chronic Disease   Chronic disease during today's visit Diabetes, Hypertension (HTN), Chronic Obstructive Pulmonary Disease (COPD)  General Interventions   General Interventions Discussed/Reviewed Communication with  Communication with Social Work, PCP/Specialists  [at Rehab facility]  Level of Care Skilled Nursing Facility       Follow up plan: Follow up call scheduled for 08/30/22    Encounter Outcome:  Pt. Visit Completed   Eric Hines, LCSW Social Work Care Coordination  Eric Herring Eric Herring Eric Herring [phone]

## 2022-08-18 NOTE — Patient Instructions (Signed)
  Patient was not contacted during this encounter.  LCSW collaborated with care team to accomplish patient's care plan goal   Mali Eppard, LCSW Social Work Care Coordination  [phone]  

## 2022-08-21 DIAGNOSIS — Z8546 Personal history of malignant neoplasm of prostate: Secondary | ICD-10-CM | POA: Diagnosis not present

## 2022-08-21 DIAGNOSIS — N184 Chronic kidney disease, stage 4 (severe): Secondary | ICD-10-CM | POA: Diagnosis not present

## 2022-08-21 DIAGNOSIS — C8331 Diffuse large B-cell lymphoma, lymph nodes of head, face, and neck: Secondary | ICD-10-CM | POA: Diagnosis not present

## 2022-08-21 DIAGNOSIS — I420 Dilated cardiomyopathy: Secondary | ICD-10-CM | POA: Diagnosis not present

## 2022-08-21 DIAGNOSIS — I5022 Chronic systolic (congestive) heart failure: Secondary | ICD-10-CM | POA: Diagnosis not present

## 2022-08-21 DIAGNOSIS — R531 Weakness: Secondary | ICD-10-CM | POA: Diagnosis not present

## 2022-08-21 DIAGNOSIS — J449 Chronic obstructive pulmonary disease, unspecified: Secondary | ICD-10-CM | POA: Diagnosis not present

## 2022-08-28 ENCOUNTER — Ambulatory Visit (HOSPITAL_COMMUNITY)
Admission: RE | Admit: 2022-08-28 | Discharge: 2022-08-28 | Disposition: A | Discharge status: Home / Self Care | Payer: Medicare HMO | Source: Ambulatory Visit | Attending: Family Medicine | Admitting: Family Medicine

## 2022-08-28 ENCOUNTER — Other Ambulatory Visit: Payer: Self-pay | Admitting: Family Medicine

## 2022-08-28 DIAGNOSIS — R918 Other nonspecific abnormal finding of lung field: Secondary | ICD-10-CM

## 2022-08-28 DIAGNOSIS — J9811 Atelectasis: Secondary | ICD-10-CM | POA: Diagnosis not present

## 2022-08-28 DIAGNOSIS — J479 Bronchiectasis, uncomplicated: Secondary | ICD-10-CM | POA: Diagnosis not present

## 2022-08-28 DIAGNOSIS — R9389 Abnormal findings on diagnostic imaging of other specified body structures: Secondary | ICD-10-CM

## 2022-08-30 ENCOUNTER — Ambulatory Visit: Payer: Self-pay | Admitting: Licensed Clinical Social Worker

## 2022-08-30 NOTE — Patient Outreach (Signed)
  Care Coordination  Follow Up Visit Note   08/30/2022 Name: KEE DRUDGE MRN: [id number] DOB: Feb 28, 1935  Eric Herring is a 88 y.o. year old male who sees Carmelia Roller, Jilda Roche, DO for primary care. I spoke with  Estanislado Spire Reetz's daughter by phone today.  What matters to the patients health and wellness today?  Returning home from facility    Goals Addressed             This Visit's Progress    Caregiver support       Activities and task to complete in order to accomplish goals.  Daughter will assist with activities Will be leaving short term rehab. This weekend. Has made some progress and will return home with home health        SDOH assessments and interventions completed:  No   Care Coordination Interventions:  Yes, provided  Interventions Today    Flowsheet Row Most Recent Value  Chronic Disease   Chronic disease during today's visit Congestive Heart Failure (CHF), Hypertension (HTN), Chronic Obstructive Pulmonary Disease (COPD), Diabetes  General Interventions   General Interventions Discussed/Reviewed Level of Care  [solution focused]  Level of Care Skilled Nursing Facility  [will be discharged back in this weekend with home health]  Safety Interventions   Safety Discussed/Reviewed Home Safety, Safety Reviewed       Follow up plan: Follow up call scheduled for 09/14/22    Encounter Outcome:  Pt. Visit Completed   Sammuel Hines, LCSW Social Work Care Coordination  [facility] Emmie Niemann [name] [phone]

## 2022-08-30 NOTE — Patient Instructions (Signed)
Visit Information  Thank you for taking time to visit with me today. Please don't hesitate to contact me if I can be of assistance to you.   Following are the goals we discussed today:   Goals Addressed             This Visit's Progress    Caregiver support       Activities and task to complete in order to accomplish goals.  Daughter will assist with activities Will be leaving short term rehab. This weekend. Has made some progress and will return home with home health         Our next appointment is by telephone on 09/14/22 at 9:15  Please call the care guide team at [phone] if you need to cancel or reschedule your appointment.   The patient verbalized understanding of instructions, educational materials, and care plan provided today and DECLINED offer to receive copy of patient instructions, educational materials, and care plan.   Sammuel Hines, LCSW Social Work Care Coordination  [facility] Emmie Niemann [name] [phone]

## 2022-09-11 DIAGNOSIS — E1122 Type 2 diabetes mellitus with diabetic chronic kidney disease: Secondary | ICD-10-CM | POA: Diagnosis not present

## 2022-09-11 DIAGNOSIS — J449 Chronic obstructive pulmonary disease, unspecified: Secondary | ICD-10-CM | POA: Diagnosis not present

## 2022-09-11 DIAGNOSIS — I5022 Chronic systolic (congestive) heart failure: Secondary | ICD-10-CM | POA: Diagnosis not present

## 2022-09-11 DIAGNOSIS — I420 Dilated cardiomyopathy: Secondary | ICD-10-CM | POA: Diagnosis not present

## 2022-09-11 DIAGNOSIS — N189 Chronic kidney disease, unspecified: Secondary | ICD-10-CM | POA: Diagnosis not present

## 2022-09-11 DIAGNOSIS — I130 Hypertensive heart and chronic kidney disease with heart failure and stage 1 through stage 4 chronic kidney disease, or unspecified chronic kidney disease: Secondary | ICD-10-CM | POA: Diagnosis not present

## 2022-09-11 DIAGNOSIS — E1140 Type 2 diabetes mellitus with diabetic neuropathy, unspecified: Secondary | ICD-10-CM | POA: Diagnosis not present

## 2022-09-11 DIAGNOSIS — I272 Pulmonary hypertension, unspecified: Secondary | ICD-10-CM | POA: Diagnosis not present

## 2022-09-11 DIAGNOSIS — I700 Atherosclerosis of aorta: Secondary | ICD-10-CM | POA: Diagnosis not present

## 2022-09-12 DIAGNOSIS — M7989 Other specified soft tissue disorders: Secondary | ICD-10-CM | POA: Diagnosis not present

## 2022-09-12 DIAGNOSIS — N184 Chronic kidney disease, stage 4 (severe): Secondary | ICD-10-CM | POA: Diagnosis not present

## 2022-09-12 DIAGNOSIS — E559 Vitamin D deficiency, unspecified: Secondary | ICD-10-CM | POA: Diagnosis not present

## 2022-09-12 DIAGNOSIS — I129 Hypertensive chronic kidney disease with stage 1 through stage 4 chronic kidney disease, or unspecified chronic kidney disease: Secondary | ICD-10-CM | POA: Diagnosis not present

## 2022-09-12 DIAGNOSIS — I1310 Hypertensive heart and chronic kidney disease without heart failure, with stage 1 through stage 4 chronic kidney disease, or unspecified chronic kidney disease: Secondary | ICD-10-CM | POA: Diagnosis not present

## 2022-09-13 DIAGNOSIS — I130 Hypertensive heart and chronic kidney disease with heart failure and stage 1 through stage 4 chronic kidney disease, or unspecified chronic kidney disease: Secondary | ICD-10-CM | POA: Diagnosis not present

## 2022-09-13 DIAGNOSIS — E1140 Type 2 diabetes mellitus with diabetic neuropathy, unspecified: Secondary | ICD-10-CM | POA: Diagnosis not present

## 2022-09-13 DIAGNOSIS — E1122 Type 2 diabetes mellitus with diabetic chronic kidney disease: Secondary | ICD-10-CM | POA: Diagnosis not present

## 2022-09-13 DIAGNOSIS — J449 Chronic obstructive pulmonary disease, unspecified: Secondary | ICD-10-CM | POA: Diagnosis not present

## 2022-09-13 DIAGNOSIS — I700 Atherosclerosis of aorta: Secondary | ICD-10-CM | POA: Diagnosis not present

## 2022-09-13 DIAGNOSIS — I272 Pulmonary hypertension, unspecified: Secondary | ICD-10-CM | POA: Diagnosis not present

## 2022-09-13 DIAGNOSIS — N189 Chronic kidney disease, unspecified: Secondary | ICD-10-CM | POA: Diagnosis not present

## 2022-09-13 DIAGNOSIS — I420 Dilated cardiomyopathy: Secondary | ICD-10-CM | POA: Diagnosis not present

## 2022-09-13 DIAGNOSIS — I5022 Chronic systolic (congestive) heart failure: Secondary | ICD-10-CM | POA: Diagnosis not present

## 2022-09-14 ENCOUNTER — Ambulatory Visit: Payer: Self-pay | Admitting: Licensed Clinical Social Worker

## 2022-09-14 NOTE — Patient Outreach (Signed)
  Care Coordination  Follow Up Visit Note   09/14/2022 Name: Eric Herring MRN: [id number] DOB: 10-26-1934  Eric Herring is a 88 y.o. year old male who sees Carmelia Roller, Jilda Roche, DO for primary care. I spoke with  Eric Herring 's daughter  Eric Herring by phone today.  What matters to the patients health and wellness today?  Managing patient's health and ADL's    Patient has returned home from short term rehab.  Continue with Home PT with goal of regaining strength.  Daughter reports recent fall upon returning home.  Continues to experience difficulty with ADL's.. Patient may benefit and daughter would like to add OT and aide.   LCSW will discuss this request with PCP.   Goals Addressed             This Visit's Progress    Caregiver support       Activities and task to complete in order to accomplish goals.  Daughter will assist with activities Please contact [employer] Care managment  [phone] for additional assistance and support I have contacted your Dr. To see if he is willing to add OT and aide to the Home Health        SDOH assessments and interventions completed:  No   Care Coordination Interventions:  Yes, provided  Interventions Today    Flowsheet Row Most Recent Value  Chronic Disease   Chronic disease during today's visit Congestive Heart Failure (CHF), Diabetes, Hypertension (HTN), Chronic Kidney Disease/End Stage Renal Disease (ESRD)  General Interventions   General Interventions Discussed/Reviewed General Interventions Reviewed, Referral to Nurse, Communication with, Level of Care  Communication with PCP/Specialists  Level of Care Personal Care Services  [continues to have difficulty with ADL's]  Exercise Interventions   Exercise Discussed/Reviewed Assistive device use and maintanence  Education Interventions   Education Provided Provided Education, Provided Printed Education  Provided Verbal Education On Insurance Plans  [place] [facility] Management  program]  Nutrition Interventions   Nutrition Discussed/Reviewed Nutrition Reviewed  Safety Interventions   Safety Discussed/Reviewed Safety Reviewed, Home Safety  Home Safety Contact provider for referral to PT/OT  [also discussed adding an aide]  Advanced Directive Interventions   Advanced Directives Discussed/Reviewed Advanced Directives Discussed, Advanced Care Planning  [per daughter patient has documents she is HPOA and will bring copies to scan into chart]       Follow up plan: Follow up call scheduled for 2 weeks    Encounter Outcome:  Pt. Visit Completed   Sammuel Hines, LCSW Social Work Care Coordination  [facility] Emmie [facility]iemann [name] [phone]

## 2022-09-14 NOTE — Patient Instructions (Signed)
Visit Information  Thank you for taking time to visit with me today. Please don't hesitate to contact me if I can be of assistance to you.   Following are the goals we discussed today:   Goals Addressed             This Visit's Progress    Caregiver support       Activities and task to complete in order to accomplish goals.  Daughter will assist with activities Please contact [facility] managment  [phone] for additional assistance and support I have contacted your Dr. To see if he is willing to add OT and aide to the Home Health         Our next appointment is by telephone on 10/03/22 at 11:30  Please call the care guide team at [phone] if you need to cancel or reschedule your appointment.    Patient's daughter verbalizes understanding of instructions and care plan provided today and agrees to view in MyChart. Active MyChart status and patient understanding of how to access instructions and care plan via MyChart confirmed with patient.     Sammuel Hines, LCSW Social Work Care Coordination  [facility] Emmie Niemann [name] [phone]

## 2022-09-18 DIAGNOSIS — I272 Pulmonary hypertension, unspecified: Secondary | ICD-10-CM | POA: Diagnosis not present

## 2022-09-18 DIAGNOSIS — J449 Chronic obstructive pulmonary disease, unspecified: Secondary | ICD-10-CM | POA: Diagnosis not present

## 2022-09-18 DIAGNOSIS — I5022 Chronic systolic (congestive) heart failure: Secondary | ICD-10-CM | POA: Diagnosis not present

## 2022-09-18 DIAGNOSIS — I420 Dilated cardiomyopathy: Secondary | ICD-10-CM | POA: Diagnosis not present

## 2022-09-18 DIAGNOSIS — I130 Hypertensive heart and chronic kidney disease with heart failure and stage 1 through stage 4 chronic kidney disease, or unspecified chronic kidney disease: Secondary | ICD-10-CM | POA: Diagnosis not present

## 2022-09-18 DIAGNOSIS — N189 Chronic kidney disease, unspecified: Secondary | ICD-10-CM | POA: Diagnosis not present

## 2022-09-18 DIAGNOSIS — E1122 Type 2 diabetes mellitus with diabetic chronic kidney disease: Secondary | ICD-10-CM | POA: Diagnosis not present

## 2022-09-18 DIAGNOSIS — E1140 Type 2 diabetes mellitus with diabetic neuropathy, unspecified: Secondary | ICD-10-CM | POA: Diagnosis not present

## 2022-09-18 DIAGNOSIS — I700 Atherosclerosis of aorta: Secondary | ICD-10-CM | POA: Diagnosis not present

## 2022-09-22 DIAGNOSIS — I700 Atherosclerosis of aorta: Secondary | ICD-10-CM | POA: Diagnosis not present

## 2022-09-22 DIAGNOSIS — I130 Hypertensive heart and chronic kidney disease with heart failure and stage 1 through stage 4 chronic kidney disease, or unspecified chronic kidney disease: Secondary | ICD-10-CM | POA: Diagnosis not present

## 2022-09-22 DIAGNOSIS — J449 Chronic obstructive pulmonary disease, unspecified: Secondary | ICD-10-CM | POA: Diagnosis not present

## 2022-09-22 DIAGNOSIS — E1122 Type 2 diabetes mellitus with diabetic chronic kidney disease: Secondary | ICD-10-CM | POA: Diagnosis not present

## 2022-09-22 DIAGNOSIS — I420 Dilated cardiomyopathy: Secondary | ICD-10-CM | POA: Diagnosis not present

## 2022-09-22 DIAGNOSIS — I5022 Chronic systolic (congestive) heart failure: Secondary | ICD-10-CM | POA: Diagnosis not present

## 2022-09-22 DIAGNOSIS — I272 Pulmonary hypertension, unspecified: Secondary | ICD-10-CM | POA: Diagnosis not present

## 2022-09-22 DIAGNOSIS — N189 Chronic kidney disease, unspecified: Secondary | ICD-10-CM | POA: Diagnosis not present

## 2022-09-22 DIAGNOSIS — E1140 Type 2 diabetes mellitus with diabetic neuropathy, unspecified: Secondary | ICD-10-CM | POA: Diagnosis not present

## 2022-09-26 DIAGNOSIS — I700 Atherosclerosis of aorta: Secondary | ICD-10-CM | POA: Diagnosis not present

## 2022-09-26 DIAGNOSIS — I5022 Chronic systolic (congestive) heart failure: Secondary | ICD-10-CM | POA: Diagnosis not present

## 2022-09-26 DIAGNOSIS — N189 Chronic kidney disease, unspecified: Secondary | ICD-10-CM | POA: Diagnosis not present

## 2022-09-26 DIAGNOSIS — I130 Hypertensive heart and chronic kidney disease with heart failure and stage 1 through stage 4 chronic kidney disease, or unspecified chronic kidney disease: Secondary | ICD-10-CM | POA: Diagnosis not present

## 2022-09-26 DIAGNOSIS — I420 Dilated cardiomyopathy: Secondary | ICD-10-CM | POA: Diagnosis not present

## 2022-09-26 DIAGNOSIS — E1122 Type 2 diabetes mellitus with diabetic chronic kidney disease: Secondary | ICD-10-CM | POA: Diagnosis not present

## 2022-09-26 DIAGNOSIS — J449 Chronic obstructive pulmonary disease, unspecified: Secondary | ICD-10-CM | POA: Diagnosis not present

## 2022-09-26 DIAGNOSIS — E1140 Type 2 diabetes mellitus with diabetic neuropathy, unspecified: Secondary | ICD-10-CM | POA: Diagnosis not present

## 2022-09-26 DIAGNOSIS — I272 Pulmonary hypertension, unspecified: Secondary | ICD-10-CM | POA: Diagnosis not present

## 2022-09-29 DIAGNOSIS — E1122 Type 2 diabetes mellitus with diabetic chronic kidney disease: Secondary | ICD-10-CM | POA: Diagnosis not present

## 2022-09-29 DIAGNOSIS — I700 Atherosclerosis of aorta: Secondary | ICD-10-CM | POA: Diagnosis not present

## 2022-09-29 DIAGNOSIS — J449 Chronic obstructive pulmonary disease, unspecified: Secondary | ICD-10-CM | POA: Diagnosis not present

## 2022-09-29 DIAGNOSIS — I130 Hypertensive heart and chronic kidney disease with heart failure and stage 1 through stage 4 chronic kidney disease, or unspecified chronic kidney disease: Secondary | ICD-10-CM | POA: Diagnosis not present

## 2022-09-29 DIAGNOSIS — N179 Acute kidney failure, unspecified: Secondary | ICD-10-CM | POA: Diagnosis not present

## 2022-09-29 DIAGNOSIS — N189 Chronic kidney disease, unspecified: Secondary | ICD-10-CM | POA: Diagnosis not present

## 2022-09-29 DIAGNOSIS — I420 Dilated cardiomyopathy: Secondary | ICD-10-CM | POA: Diagnosis not present

## 2022-09-29 DIAGNOSIS — I272 Pulmonary hypertension, unspecified: Secondary | ICD-10-CM | POA: Diagnosis not present

## 2022-09-29 DIAGNOSIS — I5022 Chronic systolic (congestive) heart failure: Secondary | ICD-10-CM | POA: Diagnosis not present

## 2022-09-29 DIAGNOSIS — E1140 Type 2 diabetes mellitus with diabetic neuropathy, unspecified: Secondary | ICD-10-CM | POA: Diagnosis not present

## 2022-10-03 ENCOUNTER — Ambulatory Visit: Payer: Self-pay | Admitting: Licensed Clinical Social Worker

## 2022-10-03 NOTE — Patient Instructions (Signed)
Social Work Visit Information  Thank you for taking time to visit with me today. Please don't hesitate to contact me if I can be of assistance to you.   Following are the goals we discussed today:   Goals Addressed             This Visit's Progress    Caregiver support       Activities and task to complete in order to accomplish goals.  Daughter will assist with activities Please contact [facility] managment  [phone] for additional assistance and support Please follow up on the private pay aide options I e-mailed you Continue to keep all medical appointments        Our next appointment is by telephone on 12/06/22 at 1:15   Please call the care guide team at [phone] if you need to cancel or reschedule your appointment.   If you or anyone you know are experiencing a Mental Health or Behavioral Health Crisis or need someone to talk to, please call the Suicide and Crisis Lifeline: 988 call the [place] National Suicide Prevention Lifeline: 513 080 7857 or TTY: (216)715-9126 TTY 614-652-8684) to talk to a trained counselor call 1-800-273-TALK (toll free, 24 hour hotline) go to [facility] [address] [place] [phone])   Patient verbalizes understanding of instructions and care plan provided today and agrees to view in MyChart. Active MyChart status and patient understanding of how to access instructions and care plan via MyChart confirmed with patient.      Sammuel Hines, LCSW Social Work Care Coordination  [facility] Emmie Niemann [name] [phone]

## 2022-10-03 NOTE — Patient Outreach (Signed)
  Care Coordination  Follow Up Visit Note   10/03/2022 Name: VICKI BESSEY MRN: [id number] DOB: 1935-03-06  EILERT OCHELTREE is a 88 y.o. year old male who sees Carmelia Roller, Jilda Roche, DO for primary care. I spoke with  Estanislado Spire Leandro's daughter Judeth Cornfield by phone today.  What matters to the patients health and wellness today?  Keeping patient home as long as possible   Daughter reports patient has completed PT,  Home Health agency [facility] does not have an aide to assist with personal care needs.  Daughter understands she will have to explore private pay options for ongoing support.  Goals Addressed             This Visit's Progress    Caregiver support       Activities and task to complete in order to accomplish goals.  Daughter will assist with activities Please contact [employer] Care managment  [phone] for additional assistance and support Please follow up on the private pay aide options I e-mailed you Continue to keep all medical appointments       SDOH assessments and interventions completed:  No   Care Coordination Interventions:  Yes, provided  Interventions Today    Flowsheet Row Most Recent Value  Chronic Disease   Chronic disease during today's visit Congestive Heart Failure (CHF), Chronic Obstructive Pulmonary Disease (COPD), Diabetes, Hypertension (HTN)  General Interventions   General Interventions Discussed/Reviewed General Interventions Reviewed, Level of Care  Level of Care Personal Care Services  Mental Health Interventions   Mental Health Discussed/Reviewed Coping Strategies  [caregiver stress ( solution focused, task centered, problem solving,)]  Safety Interventions   Safety Discussed/Reviewed Home Safety  [discussed options]       Follow up plan: Follow up call scheduled for 90 days, daughter will call sooner if needed    Encounter Outcome:  Pt. Visit Completed   Sammuel Hines, LCSW Social Work Care Coordination  [facility] Emmie Niemann  [name] [phone]

## 2022-11-08 ENCOUNTER — Telehealth (INDEPENDENT_AMBULATORY_CARE_PROVIDER_SITE_OTHER): Payer: Medicare HMO | Admitting: Family Medicine

## 2022-11-08 ENCOUNTER — Telehealth: Payer: Self-pay | Admitting: Family Medicine

## 2022-11-08 ENCOUNTER — Encounter: Payer: Self-pay | Admitting: Family Medicine

## 2022-11-08 DIAGNOSIS — N184 Chronic kidney disease, stage 4 (severe): Secondary | ICD-10-CM

## 2022-11-08 DIAGNOSIS — I420 Dilated cardiomyopathy: Secondary | ICD-10-CM

## 2022-11-08 NOTE — Progress Notes (Signed)
CC: weakness  Subjective: Patient is a 88 y.o. male here for f/u. We are interacting via web portal for an electronic face-to-face visit. I verified patient's ID using 2 identifiers. Patient agreed to proceed with visit via this method. Patient is at home, I am at office. Patient's daughter and I are present for visit.   For past couple weeks, he is unable to stand and has been falling. He is having loose/runny stools. Pt is very weak. Repeating himself more, memory is poor.  Mood is overall the same.  Seems very tired all the time.  Past Medical History:  Diagnosis Date   Acute on chronic renal insufficiency 12/11/2017   Allergy    Aortic atherosclerosis (HCC)    Arthropathy    CAD (coronary artery disease)    Cardiomyopathy (HCC)    Chronic kidney disease    Chronic renal failure 11/12/2019   Chronic systolic congestive heart failure (HCC) 01/23/2019   Colon polyp    COPD (chronic obstructive pulmonary disease) (HCC)    Diabetes (HCC)    Diabetic neuropathy (HCC)    Dilated cardiomyopathy (HCC) 11/20/2017   Heart murmur    Hypercholesterolemia    Hypertension    pulmonary   LVH (left ventricular hypertrophy)    Lymphoma (HCC)    Metabolic bone disease 12/23/2017   Mitral regurgitation    Prostate cancer (HCC)    Pulmonary hypertension (HCC)    Tricuspid regurgitation    Type 2 diabetes mellitus with neurological complications (HCC) 09/07/2017   Vitamin D deficiency 12/12/2019    Objective: No conversational dyspnea Nml affect  Assessment and Plan: Dilated cardiomyopathy (HCC) - Plan: Ambulatory referral to Hospice  Stage 4 chronic kidney disease (HCC) - Plan: Ambulatory referral to Hospice  Refer hospice. Life expectancy <2 mo. Stop ASA, pravastatin, Vit D/Ca, multivitamin. Defer rest to hospice team.  The patient's daughter voiced understanding and agreement to the plan.  Jilda Roche Dora, DO 11/08/22  12:53 PM

## 2022-11-08 NOTE — Telephone Encounter (Signed)
Patients daughter call and had some concerns regarding patients. She believes his health is declining, he cannot stand anymore and cannot control his bowel movements any more. Please call [phone], she would like to know id hospice can come out and evaluate him.

## 2022-11-08 NOTE — Telephone Encounter (Signed)
Spoke to the daughter and he is falling by his bed and unable to grip his walker to use it. BM are loose for 3 weeks, he is not eating, sleeping a lot and when sitting in a chair will not sit up/slumps over they have to use a pillow to prop him up. Judeth Cornfield does not know what/or how to proceed but the patient needs help. Last OV 07/31/22.

## 2022-11-08 NOTE — Telephone Encounter (Signed)
Scheduled today at 12:45 to get a referral going for Hospice.

## 2022-11-09 DIAGNOSIS — D631 Anemia in chronic kidney disease: Secondary | ICD-10-CM | POA: Diagnosis not present

## 2022-11-09 DIAGNOSIS — I491 Atrial premature depolarization: Secondary | ICD-10-CM | POA: Diagnosis not present

## 2022-11-09 DIAGNOSIS — I129 Hypertensive chronic kidney disease with stage 1 through stage 4 chronic kidney disease, or unspecified chronic kidney disease: Secondary | ICD-10-CM | POA: Diagnosis not present

## 2022-11-09 DIAGNOSIS — R918 Other nonspecific abnormal finding of lung field: Secondary | ICD-10-CM | POA: Diagnosis not present

## 2022-11-09 DIAGNOSIS — K828 Other specified diseases of gallbladder: Secondary | ICD-10-CM | POA: Diagnosis not present

## 2022-11-09 DIAGNOSIS — E161 Other hypoglycemia: Secondary | ICD-10-CM | POA: Diagnosis not present

## 2022-11-09 DIAGNOSIS — Z4682 Encounter for fitting and adjustment of non-vascular catheter: Secondary | ICD-10-CM | POA: Diagnosis not present

## 2022-11-09 DIAGNOSIS — G9341 Metabolic encephalopathy: Secondary | ICD-10-CM | POA: Diagnosis not present

## 2022-11-09 DIAGNOSIS — R4182 Altered mental status, unspecified: Secondary | ICD-10-CM | POA: Diagnosis not present

## 2022-11-09 DIAGNOSIS — R579 Shock, unspecified: Secondary | ICD-10-CM | POA: Diagnosis not present

## 2022-11-09 DIAGNOSIS — J9611 Chronic respiratory failure with hypoxia: Secondary | ICD-10-CM | POA: Diagnosis not present

## 2022-11-09 DIAGNOSIS — K819 Cholecystitis, unspecified: Secondary | ICD-10-CM | POA: Diagnosis not present

## 2022-11-09 DIAGNOSIS — I4510 Unspecified right bundle-branch block: Secondary | ICD-10-CM | POA: Diagnosis not present

## 2022-11-09 DIAGNOSIS — R000 Tachycardia, unspecified: Secondary | ICD-10-CM | POA: Diagnosis not present

## 2022-11-09 DIAGNOSIS — I081 Rheumatic disorders of both mitral and tricuspid valves: Secondary | ICD-10-CM | POA: Diagnosis not present

## 2022-11-09 DIAGNOSIS — R6521 Severe sepsis with septic shock: Secondary | ICD-10-CM | POA: Diagnosis not present

## 2022-11-09 DIAGNOSIS — N185 Chronic kidney disease, stage 5: Secondary | ICD-10-CM | POA: Diagnosis not present

## 2022-11-09 DIAGNOSIS — R932 Abnormal findings on diagnostic imaging of liver and biliary tract: Secondary | ICD-10-CM | POA: Diagnosis not present

## 2022-11-09 DIAGNOSIS — D72829 Elevated white blood cell count, unspecified: Secondary | ICD-10-CM | POA: Diagnosis not present

## 2022-11-09 DIAGNOSIS — I50810 Right heart failure, unspecified: Secondary | ICD-10-CM | POA: Diagnosis not present

## 2022-11-09 DIAGNOSIS — R0602 Shortness of breath: Secondary | ICD-10-CM | POA: Diagnosis not present

## 2022-11-09 DIAGNOSIS — I5022 Chronic systolic (congestive) heart failure: Secondary | ICD-10-CM | POA: Diagnosis not present

## 2022-11-09 DIAGNOSIS — E11649 Type 2 diabetes mellitus with hypoglycemia without coma: Secondary | ICD-10-CM | POA: Diagnosis not present

## 2022-11-09 DIAGNOSIS — I70291 Other atherosclerosis of native arteries of extremities, right leg: Secondary | ICD-10-CM | POA: Diagnosis not present

## 2022-11-09 DIAGNOSIS — A419 Sepsis, unspecified organism: Secondary | ICD-10-CM | POA: Diagnosis not present

## 2022-11-09 DIAGNOSIS — I959 Hypotension, unspecified: Secondary | ICD-10-CM | POA: Diagnosis not present

## 2022-11-09 DIAGNOSIS — I999 Unspecified disorder of circulatory system: Secondary | ICD-10-CM | POA: Diagnosis not present

## 2022-11-09 DIAGNOSIS — R935 Abnormal findings on diagnostic imaging of other abdominal regions, including retroperitoneum: Secondary | ICD-10-CM | POA: Diagnosis not present

## 2022-11-09 DIAGNOSIS — I70202 Unspecified atherosclerosis of native arteries of extremities, left leg: Secondary | ICD-10-CM | POA: Diagnosis not present

## 2022-11-09 DIAGNOSIS — J90 Pleural effusion, not elsewhere classified: Secondary | ICD-10-CM | POA: Diagnosis not present

## 2022-11-09 DIAGNOSIS — D696 Thrombocytopenia, unspecified: Secondary | ICD-10-CM | POA: Diagnosis not present

## 2022-11-09 DIAGNOSIS — K838 Other specified diseases of biliary tract: Secondary | ICD-10-CM | POA: Diagnosis not present

## 2022-11-09 DIAGNOSIS — I700 Atherosclerosis of aorta: Secondary | ICD-10-CM | POA: Diagnosis not present

## 2022-11-09 DIAGNOSIS — E162 Hypoglycemia, unspecified: Secondary | ICD-10-CM | POA: Diagnosis not present

## 2022-11-09 DIAGNOSIS — R570 Cardiogenic shock: Secondary | ICD-10-CM | POA: Diagnosis not present

## 2022-11-09 DIAGNOSIS — N186 End stage renal disease: Secondary | ICD-10-CM | POA: Diagnosis not present

## 2022-11-09 DIAGNOSIS — I454 Nonspecific intraventricular block: Secondary | ICD-10-CM | POA: Diagnosis not present

## 2022-11-09 DIAGNOSIS — D638 Anemia in other chronic diseases classified elsewhere: Secondary | ICD-10-CM | POA: Diagnosis not present

## 2022-11-09 DIAGNOSIS — I499 Cardiac arrhythmia, unspecified: Secondary | ICD-10-CM | POA: Diagnosis not present

## 2022-11-09 DIAGNOSIS — N179 Acute kidney failure, unspecified: Secondary | ICD-10-CM | POA: Diagnosis not present

## 2022-11-09 DIAGNOSIS — C8330 Diffuse large B-cell lymphoma, unspecified site: Secondary | ICD-10-CM | POA: Diagnosis not present

## 2022-11-09 DIAGNOSIS — N170 Acute kidney failure with tubular necrosis: Secondary | ICD-10-CM | POA: Diagnosis not present

## 2022-11-09 DIAGNOSIS — K6389 Other specified diseases of intestine: Secondary | ICD-10-CM | POA: Diagnosis not present

## 2022-11-09 DIAGNOSIS — J9621 Acute and chronic respiratory failure with hypoxia: Secondary | ICD-10-CM | POA: Diagnosis not present

## 2022-11-09 DIAGNOSIS — E872 Acidosis, unspecified: Secondary | ICD-10-CM | POA: Diagnosis not present

## 2022-11-09 DIAGNOSIS — N184 Chronic kidney disease, stage 4 (severe): Secondary | ICD-10-CM | POA: Diagnosis not present

## 2022-11-09 DIAGNOSIS — R627 Adult failure to thrive: Secondary | ICD-10-CM | POA: Diagnosis not present

## 2022-11-09 DIAGNOSIS — Z452 Encounter for adjustment and management of vascular access device: Secondary | ICD-10-CM | POA: Diagnosis not present

## 2022-11-09 DIAGNOSIS — I272 Pulmonary hypertension, unspecified: Secondary | ICD-10-CM | POA: Diagnosis not present

## 2022-11-09 DIAGNOSIS — R188 Other ascites: Secondary | ICD-10-CM | POA: Diagnosis not present

## 2022-11-09 DIAGNOSIS — I452 Bifascicular block: Secondary | ICD-10-CM | POA: Diagnosis not present

## 2022-11-09 DIAGNOSIS — I440 Atrioventricular block, first degree: Secondary | ICD-10-CM | POA: Diagnosis not present

## 2022-11-09 DIAGNOSIS — J439 Emphysema, unspecified: Secondary | ICD-10-CM | POA: Diagnosis not present

## 2022-11-09 DIAGNOSIS — I5020 Unspecified systolic (congestive) heart failure: Secondary | ICD-10-CM | POA: Diagnosis not present

## 2022-11-10 DIAGNOSIS — R6521 Severe sepsis with septic shock: Secondary | ICD-10-CM | POA: Diagnosis not present

## 2022-11-10 DIAGNOSIS — A419 Sepsis, unspecified organism: Secondary | ICD-10-CM | POA: Diagnosis not present

## 2022-11-10 DIAGNOSIS — D631 Anemia in chronic kidney disease: Secondary | ICD-10-CM | POA: Diagnosis not present

## 2022-11-10 DIAGNOSIS — N179 Acute kidney failure, unspecified: Secondary | ICD-10-CM | POA: Diagnosis not present

## 2022-11-10 DIAGNOSIS — J9611 Chronic respiratory failure with hypoxia: Secondary | ICD-10-CM | POA: Diagnosis not present

## 2022-11-10 DIAGNOSIS — I5022 Chronic systolic (congestive) heart failure: Secondary | ICD-10-CM | POA: Diagnosis not present

## 2022-11-10 DIAGNOSIS — N185 Chronic kidney disease, stage 5: Secondary | ICD-10-CM | POA: Diagnosis not present

## 2022-11-10 DIAGNOSIS — E872 Acidosis, unspecified: Secondary | ICD-10-CM | POA: Diagnosis not present

## 2022-11-10 DIAGNOSIS — N184 Chronic kidney disease, stage 4 (severe): Secondary | ICD-10-CM | POA: Diagnosis not present

## 2022-11-10 DIAGNOSIS — I491 Atrial premature depolarization: Secondary | ICD-10-CM | POA: Diagnosis not present

## 2022-11-10 DIAGNOSIS — I452 Bifascicular block: Secondary | ICD-10-CM | POA: Diagnosis not present

## 2022-11-11 DIAGNOSIS — R0602 Shortness of breath: Secondary | ICD-10-CM | POA: Diagnosis not present

## 2022-11-11 DIAGNOSIS — N179 Acute kidney failure, unspecified: Secondary | ICD-10-CM | POA: Diagnosis not present

## 2022-11-11 DIAGNOSIS — J90 Pleural effusion, not elsewhere classified: Secondary | ICD-10-CM | POA: Diagnosis not present

## 2022-11-11 DIAGNOSIS — I50810 Right heart failure, unspecified: Secondary | ICD-10-CM | POA: Diagnosis not present

## 2022-11-11 DIAGNOSIS — Z452 Encounter for adjustment and management of vascular access device: Secondary | ICD-10-CM | POA: Diagnosis not present

## 2022-11-11 DIAGNOSIS — N184 Chronic kidney disease, stage 4 (severe): Secondary | ICD-10-CM | POA: Diagnosis not present

## 2022-11-11 DIAGNOSIS — D631 Anemia in chronic kidney disease: Secondary | ICD-10-CM | POA: Diagnosis not present

## 2022-11-11 DIAGNOSIS — E872 Acidosis, unspecified: Secondary | ICD-10-CM | POA: Diagnosis not present

## 2022-11-11 DIAGNOSIS — I081 Rheumatic disorders of both mitral and tricuspid valves: Secondary | ICD-10-CM | POA: Diagnosis not present

## 2022-11-11 DIAGNOSIS — J9611 Chronic respiratory failure with hypoxia: Secondary | ICD-10-CM | POA: Diagnosis not present

## 2022-11-11 DIAGNOSIS — N185 Chronic kidney disease, stage 5: Secondary | ICD-10-CM | POA: Diagnosis not present

## 2022-11-11 DIAGNOSIS — A419 Sepsis, unspecified organism: Secondary | ICD-10-CM | POA: Diagnosis not present

## 2022-11-11 DIAGNOSIS — I5020 Unspecified systolic (congestive) heart failure: Secondary | ICD-10-CM | POA: Diagnosis not present

## 2022-11-11 DIAGNOSIS — I272 Pulmonary hypertension, unspecified: Secondary | ICD-10-CM | POA: Diagnosis not present

## 2022-11-11 DIAGNOSIS — I5022 Chronic systolic (congestive) heart failure: Secondary | ICD-10-CM | POA: Diagnosis not present

## 2022-11-11 DIAGNOSIS — R6521 Severe sepsis with septic shock: Secondary | ICD-10-CM | POA: Diagnosis not present

## 2022-11-12 DIAGNOSIS — I499 Cardiac arrhythmia, unspecified: Secondary | ICD-10-CM | POA: Diagnosis not present

## 2022-11-12 DIAGNOSIS — I5022 Chronic systolic (congestive) heart failure: Secondary | ICD-10-CM | POA: Diagnosis not present

## 2022-11-12 DIAGNOSIS — N179 Acute kidney failure, unspecified: Secondary | ICD-10-CM | POA: Diagnosis not present

## 2022-11-12 DIAGNOSIS — J9611 Chronic respiratory failure with hypoxia: Secondary | ICD-10-CM | POA: Diagnosis not present

## 2022-11-12 DIAGNOSIS — A419 Sepsis, unspecified organism: Secondary | ICD-10-CM | POA: Diagnosis not present

## 2022-11-13 DIAGNOSIS — Z4682 Encounter for fitting and adjustment of non-vascular catheter: Secondary | ICD-10-CM | POA: Diagnosis not present

## 2022-11-13 DIAGNOSIS — N179 Acute kidney failure, unspecified: Secondary | ICD-10-CM | POA: Diagnosis not present

## 2022-11-14 DIAGNOSIS — I454 Nonspecific intraventricular block: Secondary | ICD-10-CM | POA: Diagnosis not present

## 2022-11-14 DIAGNOSIS — I440 Atrioventricular block, first degree: Secondary | ICD-10-CM | POA: Diagnosis not present

## 2022-11-14 DIAGNOSIS — R579 Shock, unspecified: Secondary | ICD-10-CM | POA: Diagnosis not present

## 2022-11-14 DIAGNOSIS — N179 Acute kidney failure, unspecified: Secondary | ICD-10-CM | POA: Diagnosis not present

## 2022-11-14 DIAGNOSIS — I499 Cardiac arrhythmia, unspecified: Secondary | ICD-10-CM | POA: Diagnosis not present

## 2022-12-06 ENCOUNTER — Encounter: Payer: Medicare HMO | Admitting: Licensed Clinical Social Worker

## 2022-12-07 DEATH — deceased
